# Patient Record
Sex: Female | Born: 1960
Health system: Southern US, Community
[De-identification: ages and names within clinical notes are randomized; demographics above are authoritative.]

## PROBLEM LIST (undated history)

## (undated) DIAGNOSIS — I700 Atherosclerosis of aorta: Secondary | ICD-10-CM

## (undated) DIAGNOSIS — E785 Hyperlipidemia, unspecified: Secondary | ICD-10-CM

## (undated) DIAGNOSIS — G8929 Other chronic pain: Secondary | ICD-10-CM

## (undated) DIAGNOSIS — I708 Atherosclerosis of other arteries: Principal | ICD-10-CM

## (undated) DIAGNOSIS — F419 Anxiety disorder, unspecified: Secondary | ICD-10-CM

## (undated) HISTORY — DX: Atherosclerosis of other arteries: I70.8

## (undated) HISTORY — PX: APPENDECTOMY: SHX54

## (undated) HISTORY — PX: CHOLECYSTECTOMY: SHX55

## (undated) HISTORY — DX: Hyperlipidemia, unspecified: E78.5

## (undated) HISTORY — PX: ABDOMINAL HYSTERECTOMY: SHX81

## (undated) HISTORY — PX: CARPAL TUNNEL RELEASE: SHX101

## (undated) HISTORY — PX: NEPHRECTOMY: SHX65

## (undated) HISTORY — PX: BREAST LUMPECTOMY: SHX2

## (undated) HISTORY — DX: Atherosclerosis of aorta: I70.0

## (undated) HISTORY — DX: Anxiety disorder, unspecified: F41.9

## (undated) HISTORY — DX: Other chronic pain: G89.29

---

## 2007-01-05 ENCOUNTER — Emergency Department: Payer: Self-pay

## 2007-01-05 ENCOUNTER — Other Ambulatory Visit: Payer: Self-pay

## 2007-02-27 ENCOUNTER — Ambulatory Visit: Payer: Self-pay | Admitting: Orthopedic Surgery

## 2007-03-06 ENCOUNTER — Ambulatory Visit: Payer: Self-pay | Admitting: Orthopedic Surgery

## 2008-12-16 ENCOUNTER — Emergency Department: Payer: Self-pay | Admitting: Unknown Physician Specialty

## 2010-03-21 ENCOUNTER — Emergency Department: Payer: Self-pay | Admitting: Emergency Medicine

## 2011-01-29 ENCOUNTER — Encounter: Payer: Self-pay | Admitting: Family Medicine

## 2011-01-30 ENCOUNTER — Encounter: Payer: Self-pay | Admitting: Family Medicine

## 2011-02-28 ENCOUNTER — Encounter: Payer: Self-pay | Admitting: Family Medicine

## 2011-03-07 ENCOUNTER — Ambulatory Visit: Payer: Self-pay | Admitting: Family Medicine

## 2012-01-08 DIAGNOSIS — M25519 Pain in unspecified shoulder: Secondary | ICD-10-CM | POA: Diagnosis not present

## 2012-01-08 DIAGNOSIS — E559 Vitamin D deficiency, unspecified: Secondary | ICD-10-CM | POA: Diagnosis not present

## 2012-01-08 DIAGNOSIS — G894 Chronic pain syndrome: Secondary | ICD-10-CM | POA: Diagnosis not present

## 2012-01-08 DIAGNOSIS — M6281 Muscle weakness (generalized): Secondary | ICD-10-CM | POA: Diagnosis not present

## 2012-01-08 DIAGNOSIS — R197 Diarrhea, unspecified: Secondary | ICD-10-CM | POA: Diagnosis not present

## 2012-01-08 DIAGNOSIS — Z733 Stress, not elsewhere classified: Secondary | ICD-10-CM | POA: Diagnosis not present

## 2012-01-08 DIAGNOSIS — E785 Hyperlipidemia, unspecified: Secondary | ICD-10-CM | POA: Diagnosis not present

## 2012-02-11 DIAGNOSIS — H10509 Unspecified blepharoconjunctivitis, unspecified eye: Secondary | ICD-10-CM | POA: Diagnosis not present

## 2012-04-07 DIAGNOSIS — Z733 Stress, not elsewhere classified: Secondary | ICD-10-CM | POA: Diagnosis not present

## 2012-04-07 DIAGNOSIS — M545 Low back pain, unspecified: Secondary | ICD-10-CM | POA: Diagnosis not present

## 2012-04-07 DIAGNOSIS — Z638 Other specified problems related to primary support group: Secondary | ICD-10-CM | POA: Diagnosis not present

## 2012-04-07 DIAGNOSIS — G894 Chronic pain syndrome: Secondary | ICD-10-CM | POA: Diagnosis not present

## 2012-05-14 DIAGNOSIS — L408 Other psoriasis: Secondary | ICD-10-CM | POA: Diagnosis not present

## 2012-07-29 DIAGNOSIS — M4120 Other idiopathic scoliosis, site unspecified: Secondary | ICD-10-CM | POA: Diagnosis not present

## 2012-07-29 DIAGNOSIS — E559 Vitamin D deficiency, unspecified: Secondary | ICD-10-CM | POA: Diagnosis not present

## 2012-07-29 DIAGNOSIS — M542 Cervicalgia: Secondary | ICD-10-CM | POA: Diagnosis not present

## 2012-07-29 DIAGNOSIS — Z733 Stress, not elsewhere classified: Secondary | ICD-10-CM | POA: Diagnosis not present

## 2012-11-09 ENCOUNTER — Ambulatory Visit: Payer: Self-pay | Admitting: Family Medicine

## 2012-11-09 DIAGNOSIS — M542 Cervicalgia: Secondary | ICD-10-CM | POA: Diagnosis not present

## 2012-11-09 DIAGNOSIS — I6529 Occlusion and stenosis of unspecified carotid artery: Secondary | ICD-10-CM | POA: Diagnosis not present

## 2012-12-31 DIAGNOSIS — R0989 Other specified symptoms and signs involving the circulatory and respiratory systems: Secondary | ICD-10-CM | POA: Diagnosis not present

## 2012-12-31 DIAGNOSIS — M542 Cervicalgia: Secondary | ICD-10-CM | POA: Diagnosis not present

## 2013-01-01 DIAGNOSIS — I6529 Occlusion and stenosis of unspecified carotid artery: Secondary | ICD-10-CM | POA: Diagnosis not present

## 2013-01-01 DIAGNOSIS — F17200 Nicotine dependence, unspecified, uncomplicated: Secondary | ICD-10-CM | POA: Diagnosis not present

## 2013-01-01 DIAGNOSIS — R0989 Other specified symptoms and signs involving the circulatory and respiratory systems: Secondary | ICD-10-CM | POA: Diagnosis not present

## 2013-01-01 DIAGNOSIS — E785 Hyperlipidemia, unspecified: Secondary | ICD-10-CM | POA: Diagnosis not present

## 2013-01-09 ENCOUNTER — Emergency Department: Payer: Self-pay | Admitting: Unknown Physician Specialty

## 2013-01-09 DIAGNOSIS — F17200 Nicotine dependence, unspecified, uncomplicated: Secondary | ICD-10-CM | POA: Diagnosis not present

## 2013-01-09 DIAGNOSIS — Z9089 Acquired absence of other organs: Secondary | ICD-10-CM | POA: Diagnosis not present

## 2013-01-09 DIAGNOSIS — Z9079 Acquired absence of other genital organ(s): Secondary | ICD-10-CM | POA: Diagnosis not present

## 2013-01-09 DIAGNOSIS — R0789 Other chest pain: Secondary | ICD-10-CM | POA: Diagnosis not present

## 2013-01-09 DIAGNOSIS — Z79899 Other long term (current) drug therapy: Secondary | ICD-10-CM | POA: Diagnosis not present

## 2013-01-09 DIAGNOSIS — R079 Chest pain, unspecified: Secondary | ICD-10-CM | POA: Diagnosis not present

## 2013-01-09 DIAGNOSIS — E785 Hyperlipidemia, unspecified: Secondary | ICD-10-CM | POA: Diagnosis not present

## 2013-01-09 LAB — CBC
HCT: 39.6 % (ref 35.0–47.0)
HGB: 13.4 g/dL (ref 12.0–16.0)
MCHC: 33.8 g/dL (ref 32.0–36.0)
MCV: 89 fL (ref 80–100)
Platelet: 229 10*3/uL (ref 150–440)

## 2013-01-09 LAB — BASIC METABOLIC PANEL
Anion Gap: 10 (ref 7–16)
BUN: 6 mg/dL — ABNORMAL LOW (ref 7–18)
Calcium, Total: 8.8 mg/dL (ref 8.5–10.1)
Co2: 24 mmol/L (ref 21–32)
Creatinine: 0.73 mg/dL (ref 0.60–1.30)
EGFR (Non-African Amer.): >60
Glucose: 130 mg/dL — ABNORMAL HIGH (ref 65–99)
Potassium: 3.8 mmol/L (ref 3.5–5.1)
Sodium: 138 mmol/L (ref 136–145)

## 2013-01-09 LAB — CK TOTAL AND CKMB (NOT AT ARMC)
CK, Total: 72 U/L (ref 21–215)
CK-MB: <0.5 ng/mL — ABNORMAL LOW (ref 0.5–3.6)

## 2013-01-09 LAB — TROPONIN I
Troponin-I: <0.02 ng/mL
Troponin-I: <0.02 ng/mL

## 2013-01-09 LAB — MAGNESIUM: Magnesium: 2 mg/dL

## 2013-01-12 ENCOUNTER — Ambulatory Visit: Payer: Self-pay | Admitting: Family Medicine

## 2013-01-12 DIAGNOSIS — J40 Bronchitis, not specified as acute or chronic: Secondary | ICD-10-CM | POA: Diagnosis not present

## 2013-01-12 DIAGNOSIS — R9431 Abnormal electrocardiogram [ECG] [EKG]: Secondary | ICD-10-CM | POA: Diagnosis not present

## 2013-01-12 DIAGNOSIS — R079 Chest pain, unspecified: Secondary | ICD-10-CM | POA: Diagnosis not present

## 2013-01-14 DIAGNOSIS — I471 Supraventricular tachycardia: Secondary | ICD-10-CM | POA: Diagnosis not present

## 2013-01-14 DIAGNOSIS — J449 Chronic obstructive pulmonary disease, unspecified: Secondary | ICD-10-CM | POA: Diagnosis not present

## 2013-01-14 DIAGNOSIS — E782 Mixed hyperlipidemia: Secondary | ICD-10-CM | POA: Diagnosis not present

## 2013-01-14 DIAGNOSIS — R079 Chest pain, unspecified: Secondary | ICD-10-CM | POA: Diagnosis not present

## 2013-01-28 DIAGNOSIS — I471 Supraventricular tachycardia: Secondary | ICD-10-CM | POA: Diagnosis not present

## 2013-03-09 DIAGNOSIS — M542 Cervicalgia: Secondary | ICD-10-CM | POA: Diagnosis not present

## 2013-04-09 DIAGNOSIS — M542 Cervicalgia: Secondary | ICD-10-CM | POA: Diagnosis not present

## 2013-05-06 DIAGNOSIS — M545 Low back pain, unspecified: Secondary | ICD-10-CM | POA: Diagnosis not present

## 2013-05-06 DIAGNOSIS — F411 Generalized anxiety disorder: Secondary | ICD-10-CM | POA: Diagnosis not present

## 2013-05-06 DIAGNOSIS — M542 Cervicalgia: Secondary | ICD-10-CM | POA: Diagnosis not present

## 2013-05-12 ENCOUNTER — Ambulatory Visit: Payer: Self-pay | Admitting: Family Medicine

## 2013-05-12 DIAGNOSIS — M545 Low back pain, unspecified: Secondary | ICD-10-CM | POA: Diagnosis not present

## 2013-05-12 DIAGNOSIS — M549 Dorsalgia, unspecified: Secondary | ICD-10-CM | POA: Diagnosis not present

## 2013-06-02 ENCOUNTER — Ambulatory Visit: Payer: Self-pay | Admitting: Family Medicine

## 2013-06-02 DIAGNOSIS — R197 Diarrhea, unspecified: Secondary | ICD-10-CM | POA: Diagnosis not present

## 2013-06-02 DIAGNOSIS — M79609 Pain in unspecified limb: Secondary | ICD-10-CM | POA: Diagnosis not present

## 2013-06-07 DIAGNOSIS — M542 Cervicalgia: Secondary | ICD-10-CM | POA: Diagnosis not present

## 2013-06-07 DIAGNOSIS — M5430 Sciatica, unspecified side: Secondary | ICD-10-CM | POA: Diagnosis not present

## 2013-06-16 DIAGNOSIS — N390 Urinary tract infection, site not specified: Secondary | ICD-10-CM | POA: Diagnosis not present

## 2013-06-16 DIAGNOSIS — M545 Low back pain, unspecified: Secondary | ICD-10-CM | POA: Diagnosis not present

## 2013-08-02 DIAGNOSIS — F411 Generalized anxiety disorder: Secondary | ICD-10-CM | POA: Diagnosis not present

## 2013-08-02 DIAGNOSIS — N390 Urinary tract infection, site not specified: Secondary | ICD-10-CM | POA: Diagnosis not present

## 2013-09-08 ENCOUNTER — Ambulatory Visit: Payer: Self-pay | Admitting: Family Medicine

## 2013-09-08 DIAGNOSIS — M25569 Pain in unspecified knee: Secondary | ICD-10-CM | POA: Diagnosis not present

## 2013-10-07 DIAGNOSIS — IMO0002 Reserved for concepts with insufficient information to code with codable children: Secondary | ICD-10-CM | POA: Diagnosis not present

## 2013-10-07 DIAGNOSIS — G894 Chronic pain syndrome: Secondary | ICD-10-CM | POA: Diagnosis not present

## 2013-10-07 DIAGNOSIS — N390 Urinary tract infection, site not specified: Secondary | ICD-10-CM | POA: Diagnosis not present

## 2013-10-11 DIAGNOSIS — N390 Urinary tract infection, site not specified: Secondary | ICD-10-CM | POA: Diagnosis not present

## 2013-11-05 DIAGNOSIS — M25569 Pain in unspecified knee: Secondary | ICD-10-CM | POA: Diagnosis not present

## 2013-11-05 DIAGNOSIS — M25519 Pain in unspecified shoulder: Secondary | ICD-10-CM | POA: Diagnosis not present

## 2013-11-05 DIAGNOSIS — M545 Low back pain, unspecified: Secondary | ICD-10-CM | POA: Diagnosis not present

## 2013-11-05 DIAGNOSIS — G8929 Other chronic pain: Secondary | ICD-10-CM | POA: Diagnosis not present

## 2013-12-03 DIAGNOSIS — Z79899 Other long term (current) drug therapy: Secondary | ICD-10-CM | POA: Diagnosis not present

## 2013-12-03 DIAGNOSIS — G894 Chronic pain syndrome: Secondary | ICD-10-CM | POA: Diagnosis not present

## 2013-12-03 DIAGNOSIS — M545 Low back pain, unspecified: Secondary | ICD-10-CM | POA: Diagnosis not present

## 2014-02-03 DIAGNOSIS — E538 Deficiency of other specified B group vitamins: Secondary | ICD-10-CM | POA: Diagnosis not present

## 2014-03-03 DIAGNOSIS — E538 Deficiency of other specified B group vitamins: Secondary | ICD-10-CM | POA: Diagnosis not present

## 2014-03-10 ENCOUNTER — Ambulatory Visit: Payer: Self-pay | Admitting: Family Medicine

## 2014-03-10 DIAGNOSIS — R922 Inconclusive mammogram: Secondary | ICD-10-CM | POA: Diagnosis not present

## 2014-03-10 DIAGNOSIS — N644 Mastodynia: Secondary | ICD-10-CM | POA: Diagnosis not present

## 2014-04-01 DIAGNOSIS — R071 Chest pain on breathing: Secondary | ICD-10-CM | POA: Diagnosis not present

## 2014-05-03 DIAGNOSIS — G4762 Sleep related leg cramps: Secondary | ICD-10-CM | POA: Diagnosis not present

## 2014-05-04 DIAGNOSIS — D239 Other benign neoplasm of skin, unspecified: Secondary | ICD-10-CM | POA: Diagnosis not present

## 2014-05-04 DIAGNOSIS — L408 Other psoriasis: Secondary | ICD-10-CM | POA: Diagnosis not present

## 2014-06-03 ENCOUNTER — Ambulatory Visit: Payer: Self-pay | Admitting: Family Medicine

## 2014-06-03 DIAGNOSIS — M25519 Pain in unspecified shoulder: Secondary | ICD-10-CM | POA: Diagnosis not present

## 2014-06-03 DIAGNOSIS — M25529 Pain in unspecified elbow: Secondary | ICD-10-CM | POA: Diagnosis not present

## 2014-07-04 DIAGNOSIS — F411 Generalized anxiety disorder: Secondary | ICD-10-CM | POA: Diagnosis not present

## 2014-07-04 DIAGNOSIS — M545 Low back pain, unspecified: Secondary | ICD-10-CM | POA: Diagnosis not present

## 2014-07-04 DIAGNOSIS — Z79899 Other long term (current) drug therapy: Secondary | ICD-10-CM | POA: Diagnosis not present

## 2014-07-04 DIAGNOSIS — G894 Chronic pain syndrome: Secondary | ICD-10-CM | POA: Diagnosis not present

## 2014-08-05 ENCOUNTER — Other Ambulatory Visit: Payer: Self-pay | Admitting: Family Medicine

## 2014-08-05 DIAGNOSIS — M5412 Radiculopathy, cervical region: Secondary | ICD-10-CM

## 2014-08-11 ENCOUNTER — Ambulatory Visit
Admission: RE | Admit: 2014-08-11 | Discharge: 2014-08-11 | Disposition: A | Discharge status: Home / Self Care | Payer: Medicare Other | Source: Ambulatory Visit | Attending: Family Medicine | Admitting: Family Medicine

## 2014-08-11 DIAGNOSIS — M5412 Radiculopathy, cervical region: Secondary | ICD-10-CM

## 2014-10-07 DIAGNOSIS — M791 Myalgia: Secondary | ICD-10-CM | POA: Diagnosis not present

## 2014-10-07 DIAGNOSIS — G894 Chronic pain syndrome: Secondary | ICD-10-CM | POA: Diagnosis not present

## 2014-10-07 DIAGNOSIS — Z658 Other specified problems related to psychosocial circumstances: Secondary | ICD-10-CM | POA: Diagnosis not present

## 2014-10-07 DIAGNOSIS — F419 Anxiety disorder, unspecified: Secondary | ICD-10-CM | POA: Diagnosis not present

## 2014-10-08 IMAGING — CR DG FOOT COMPLETE 3+V*L*
1 series · 3 of 3 positions shown · non-contrast
Comparison: none

REASON FOR EXAM: pain in toe
COMMENTS:

PROCEDURE:     KDR - KDXR FOOT LT COMP W/OBLIQUES  - June 02, 2013 [DATE]
RESULT:     Three views of the left foot reveal the bones to be adequately
mineralized. There is no evidence of an acute fracture. The overlying soft
tissues are normal in appearance.

[Series 1: ap · 0.17mm/px · 3 of 3 slices shown]
[im 1/3]
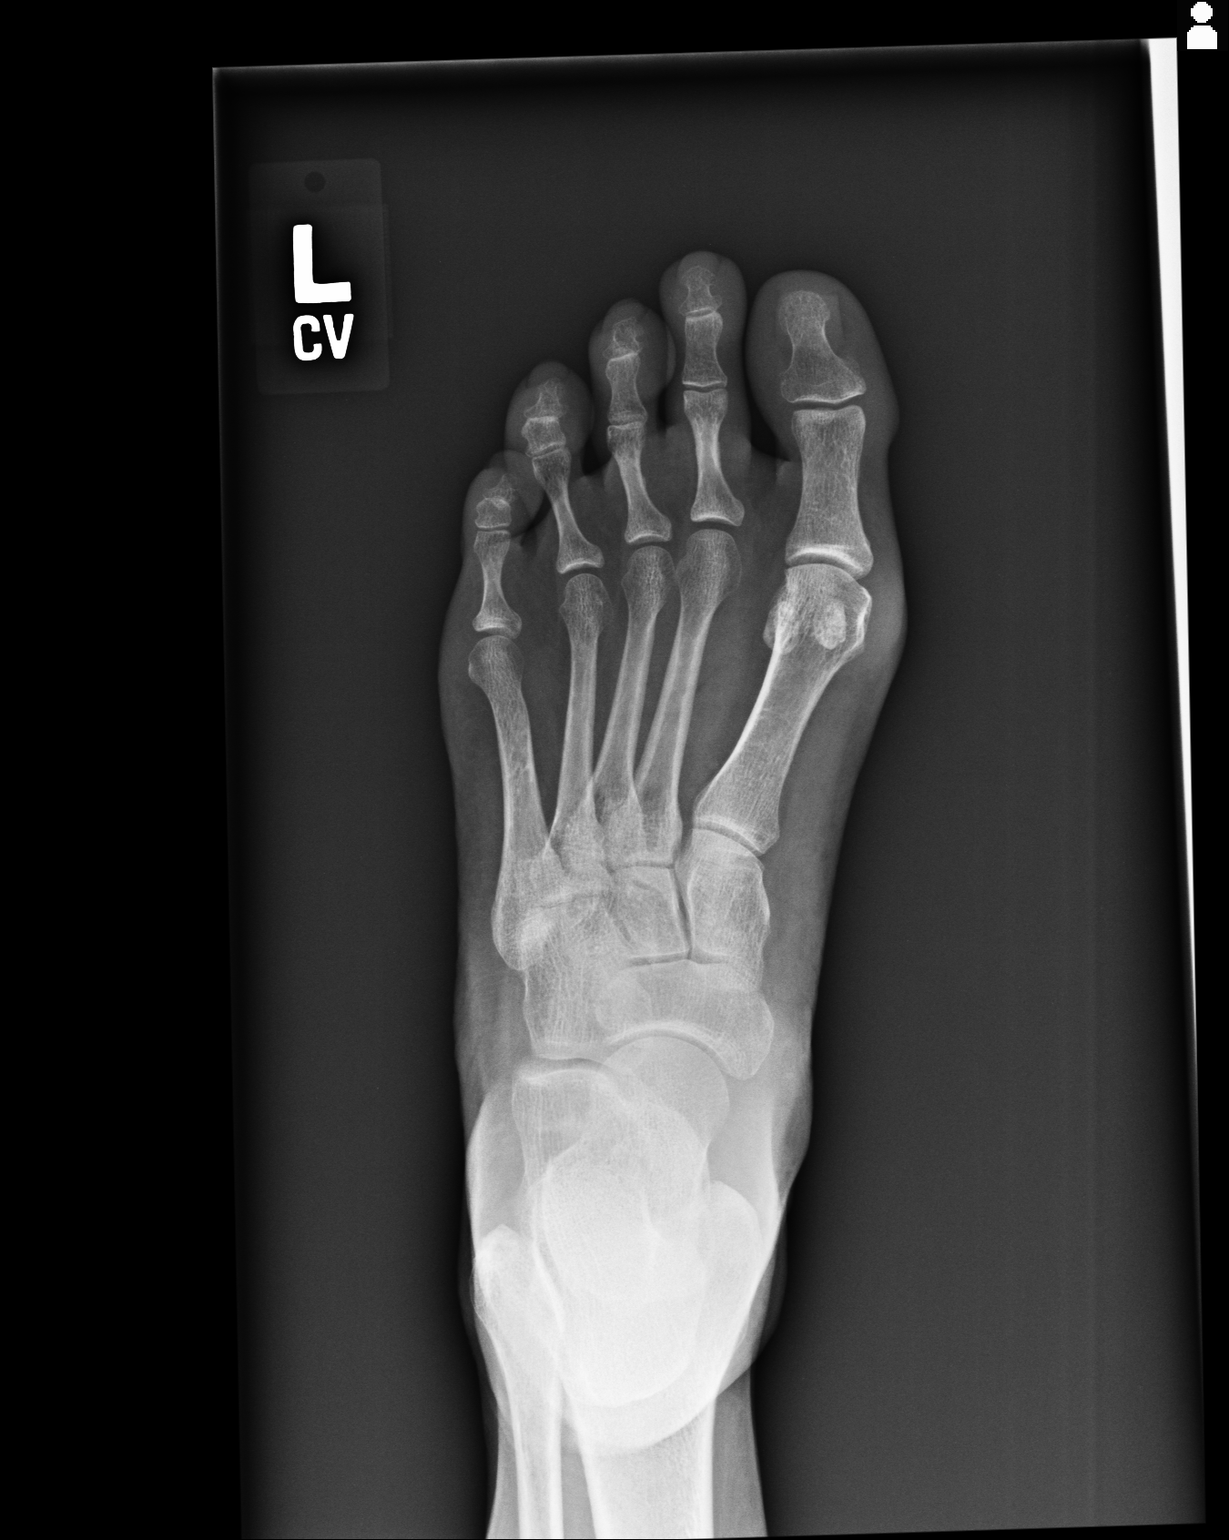
[im 2/3]
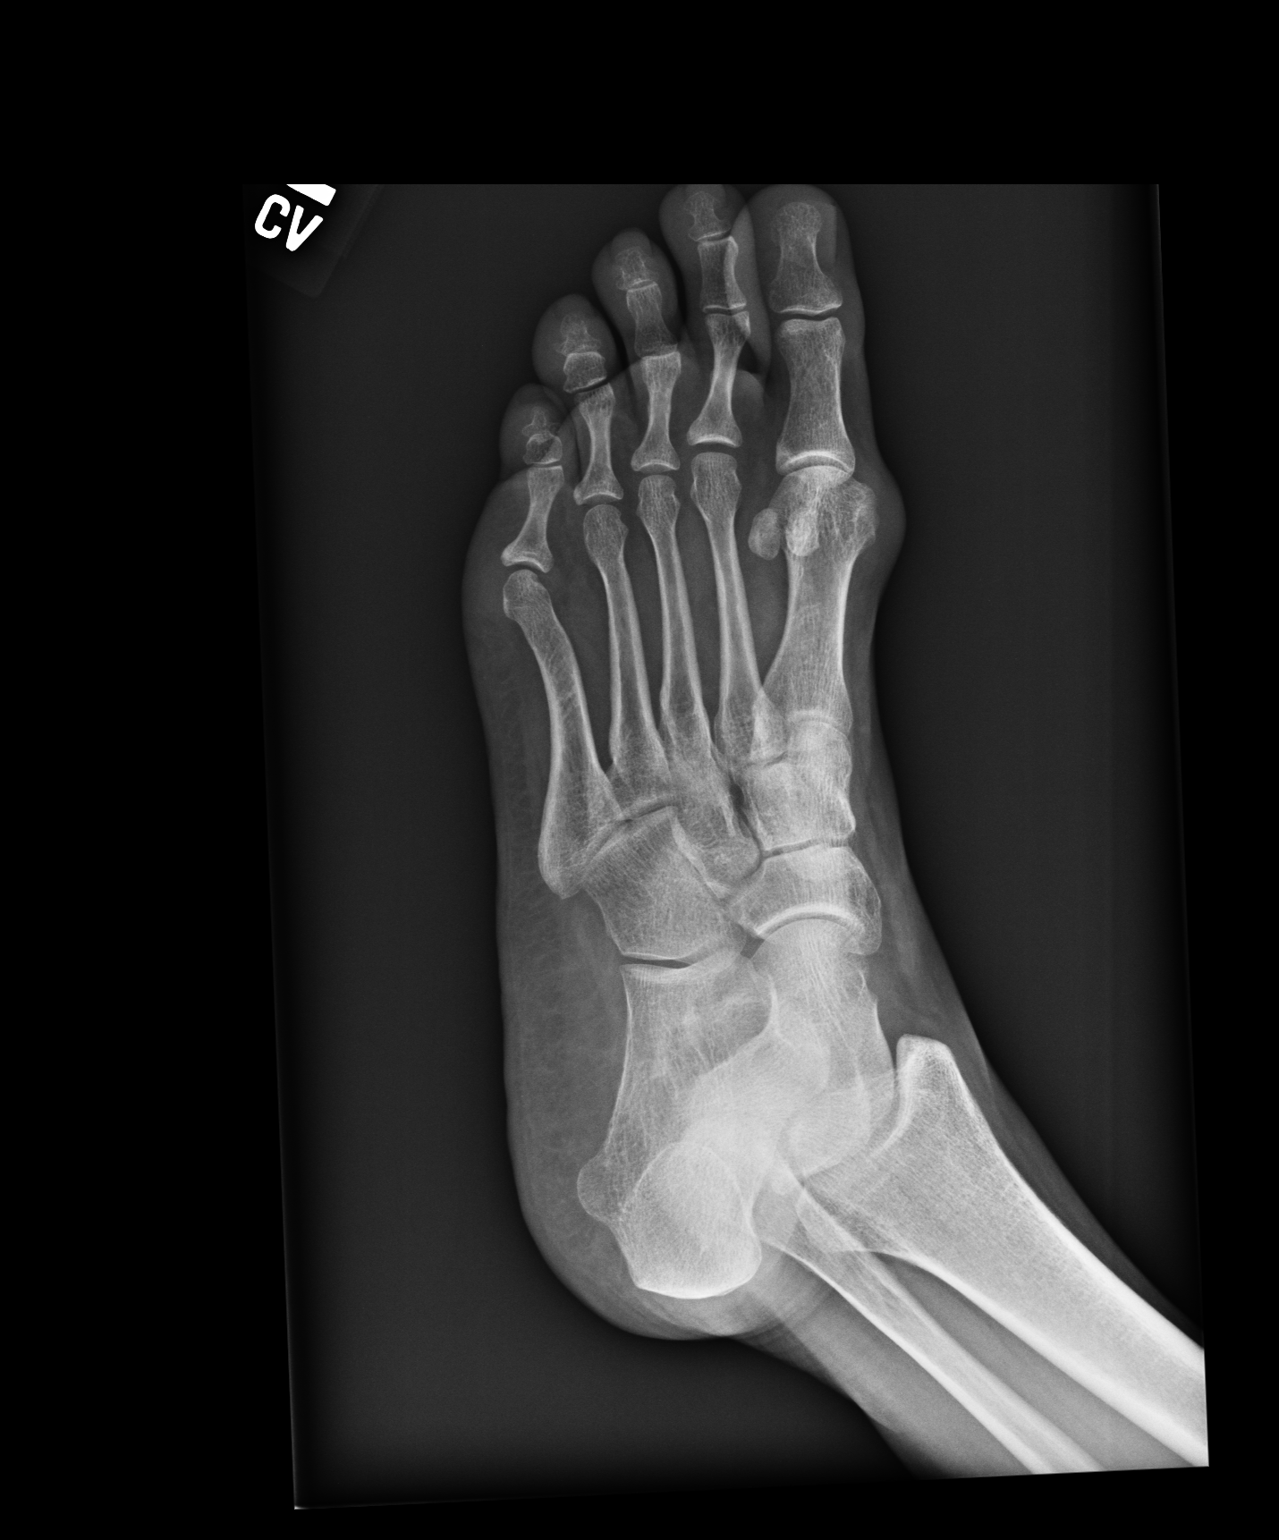
[im 3/3]
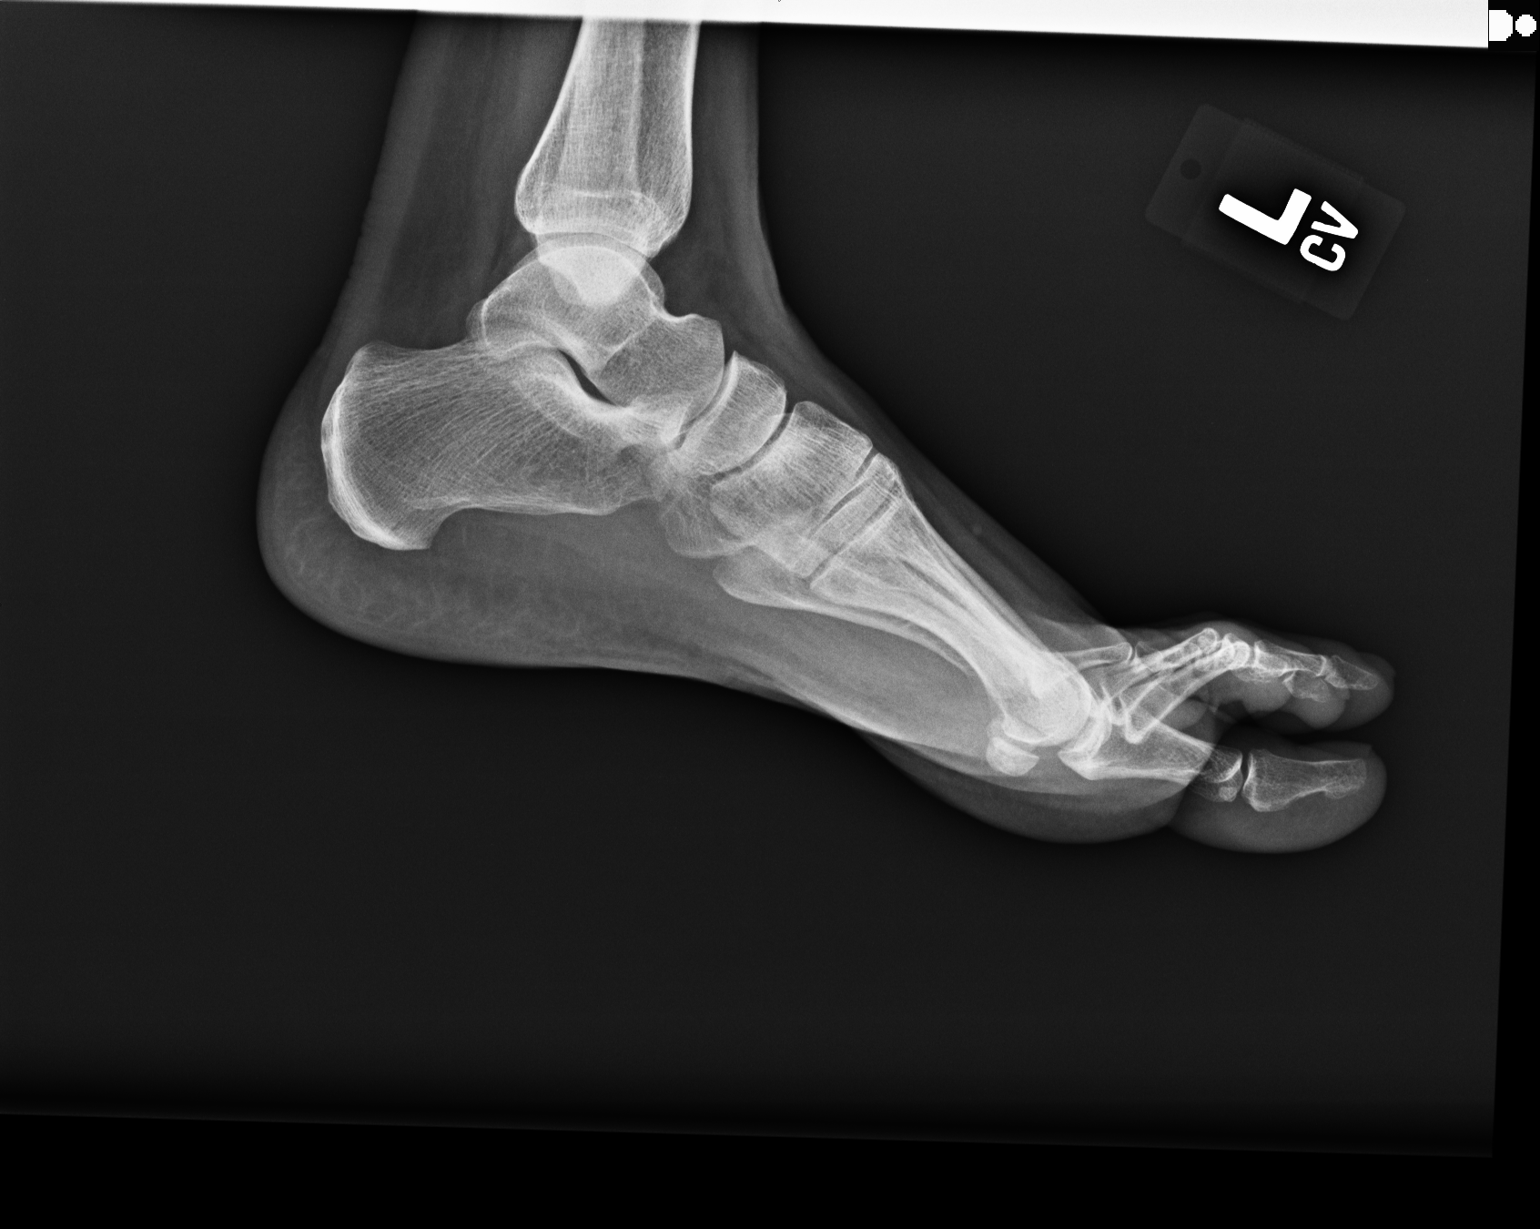

[3 of 3 positions shown; findings below may reference images not displayed]

IMPRESSION: There is no acute bony abnormality of the left foot.

[REDACTED]

## 2014-10-13 DIAGNOSIS — E785 Hyperlipidemia, unspecified: Secondary | ICD-10-CM | POA: Diagnosis not present

## 2014-11-08 DIAGNOSIS — F419 Anxiety disorder, unspecified: Secondary | ICD-10-CM | POA: Diagnosis not present

## 2014-11-08 DIAGNOSIS — Z79891 Long term (current) use of opiate analgesic: Secondary | ICD-10-CM | POA: Diagnosis not present

## 2014-11-12 DIAGNOSIS — N390 Urinary tract infection, site not specified: Secondary | ICD-10-CM | POA: Diagnosis not present

## 2014-12-06 DIAGNOSIS — F419 Anxiety disorder, unspecified: Secondary | ICD-10-CM | POA: Diagnosis not present

## 2014-12-06 DIAGNOSIS — M542 Cervicalgia: Secondary | ICD-10-CM | POA: Diagnosis not present

## 2015-01-06 DIAGNOSIS — M5410 Radiculopathy, site unspecified: Secondary | ICD-10-CM | POA: Diagnosis not present

## 2015-01-06 DIAGNOSIS — R634 Abnormal weight loss: Secondary | ICD-10-CM | POA: Diagnosis not present

## 2015-01-06 DIAGNOSIS — F419 Anxiety disorder, unspecified: Secondary | ICD-10-CM | POA: Diagnosis not present

## 2015-01-06 DIAGNOSIS — M542 Cervicalgia: Secondary | ICD-10-CM | POA: Diagnosis not present

## 2015-01-11 ENCOUNTER — Ambulatory Visit: Payer: Self-pay | Admitting: Family Medicine

## 2015-01-11 DIAGNOSIS — M545 Low back pain: Secondary | ICD-10-CM | POA: Diagnosis not present

## 2015-01-11 DIAGNOSIS — M25551 Pain in right hip: Secondary | ICD-10-CM | POA: Diagnosis not present

## 2015-02-06 DIAGNOSIS — F419 Anxiety disorder, unspecified: Secondary | ICD-10-CM | POA: Diagnosis not present

## 2015-02-06 DIAGNOSIS — M542 Cervicalgia: Secondary | ICD-10-CM | POA: Diagnosis not present

## 2015-02-06 DIAGNOSIS — M79605 Pain in left leg: Secondary | ICD-10-CM | POA: Diagnosis not present

## 2015-02-06 DIAGNOSIS — M79604 Pain in right leg: Secondary | ICD-10-CM | POA: Diagnosis not present

## 2015-02-28 DIAGNOSIS — J209 Acute bronchitis, unspecified: Secondary | ICD-10-CM | POA: Diagnosis not present

## 2015-03-03 DIAGNOSIS — R0602 Shortness of breath: Secondary | ICD-10-CM | POA: Diagnosis not present

## 2015-03-03 DIAGNOSIS — R0789 Other chest pain: Secondary | ICD-10-CM | POA: Insufficient documentation

## 2015-03-03 DIAGNOSIS — R002 Palpitations: Secondary | ICD-10-CM | POA: Diagnosis not present

## 2015-03-07 DIAGNOSIS — F419 Anxiety disorder, unspecified: Secondary | ICD-10-CM | POA: Diagnosis not present

## 2015-03-07 DIAGNOSIS — M542 Cervicalgia: Secondary | ICD-10-CM | POA: Diagnosis not present

## 2015-03-07 DIAGNOSIS — R252 Cramp and spasm: Secondary | ICD-10-CM | POA: Diagnosis not present

## 2015-03-10 DIAGNOSIS — R002 Palpitations: Secondary | ICD-10-CM | POA: Diagnosis not present

## 2015-03-14 DIAGNOSIS — R0602 Shortness of breath: Secondary | ICD-10-CM | POA: Diagnosis not present

## 2015-03-31 DIAGNOSIS — R0789 Other chest pain: Secondary | ICD-10-CM | POA: Diagnosis not present

## 2015-04-05 DIAGNOSIS — R0789 Other chest pain: Secondary | ICD-10-CM | POA: Diagnosis not present

## 2015-04-07 DIAGNOSIS — F419 Anxiety disorder, unspecified: Secondary | ICD-10-CM | POA: Diagnosis not present

## 2015-04-07 DIAGNOSIS — M542 Cervicalgia: Secondary | ICD-10-CM | POA: Diagnosis not present

## 2015-04-07 DIAGNOSIS — E785 Hyperlipidemia, unspecified: Secondary | ICD-10-CM | POA: Diagnosis not present

## 2015-04-08 ENCOUNTER — Emergency Department
Admit: 2015-04-08 | Disposition: A | Discharge status: Home / Self Care | Payer: Self-pay | Admitting: Emergency Medicine

## 2015-04-08 DIAGNOSIS — L03115 Cellulitis of right lower limb: Secondary | ICD-10-CM | POA: Diagnosis not present

## 2015-04-08 DIAGNOSIS — M7989 Other specified soft tissue disorders: Secondary | ICD-10-CM | POA: Diagnosis not present

## 2015-04-08 DIAGNOSIS — Z91040 Latex allergy status: Secondary | ICD-10-CM | POA: Diagnosis not present

## 2015-04-08 DIAGNOSIS — M25562 Pain in left knee: Secondary | ICD-10-CM | POA: Diagnosis not present

## 2015-04-08 DIAGNOSIS — L03116 Cellulitis of left lower limb: Secondary | ICD-10-CM | POA: Diagnosis not present

## 2015-04-08 DIAGNOSIS — Z720 Tobacco use: Secondary | ICD-10-CM | POA: Diagnosis not present

## 2015-04-09 DIAGNOSIS — M25562 Pain in left knee: Secondary | ICD-10-CM | POA: Diagnosis not present

## 2015-04-09 DIAGNOSIS — M7989 Other specified soft tissue disorders: Secondary | ICD-10-CM | POA: Diagnosis not present

## 2015-04-11 ENCOUNTER — Ambulatory Visit
Admit: 2015-04-11 | Disposition: A | Discharge status: Home / Self Care | Payer: Self-pay | Attending: Family Medicine | Admitting: Family Medicine

## 2015-04-11 DIAGNOSIS — M25462 Effusion, left knee: Secondary | ICD-10-CM | POA: Diagnosis not present

## 2015-04-11 DIAGNOSIS — M21731 Unequal limb length (acquired), right ulna: Secondary | ICD-10-CM | POA: Diagnosis not present

## 2015-04-11 DIAGNOSIS — M7989 Other specified soft tissue disorders: Secondary | ICD-10-CM | POA: Diagnosis not present

## 2015-04-11 DIAGNOSIS — M25562 Pain in left knee: Secondary | ICD-10-CM | POA: Diagnosis not present

## 2015-04-28 DIAGNOSIS — L400 Psoriasis vulgaris: Secondary | ICD-10-CM | POA: Diagnosis not present

## 2015-04-28 DIAGNOSIS — D485 Neoplasm of uncertain behavior of skin: Secondary | ICD-10-CM | POA: Diagnosis not present

## 2015-04-28 DIAGNOSIS — L821 Other seborrheic keratosis: Secondary | ICD-10-CM | POA: Diagnosis not present

## 2015-04-28 DIAGNOSIS — L858 Other specified epidermal thickening: Secondary | ICD-10-CM | POA: Diagnosis not present

## 2015-05-05 DIAGNOSIS — M549 Dorsalgia, unspecified: Secondary | ICD-10-CM | POA: Diagnosis not present

## 2015-05-05 DIAGNOSIS — F419 Anxiety disorder, unspecified: Secondary | ICD-10-CM | POA: Diagnosis not present

## 2015-05-05 DIAGNOSIS — M542 Cervicalgia: Secondary | ICD-10-CM | POA: Diagnosis not present

## 2015-06-02 DIAGNOSIS — M25519 Pain in unspecified shoulder: Secondary | ICD-10-CM | POA: Insufficient documentation

## 2015-06-02 DIAGNOSIS — E785 Hyperlipidemia, unspecified: Secondary | ICD-10-CM | POA: Insufficient documentation

## 2015-06-02 DIAGNOSIS — M4120 Other idiopathic scoliosis, site unspecified: Secondary | ICD-10-CM | POA: Insufficient documentation

## 2015-06-02 DIAGNOSIS — F419 Anxiety disorder, unspecified: Secondary | ICD-10-CM | POA: Insufficient documentation

## 2015-06-02 DIAGNOSIS — M79606 Pain in leg, unspecified: Secondary | ICD-10-CM | POA: Insufficient documentation

## 2015-06-02 DIAGNOSIS — E538 Deficiency of other specified B group vitamins: Secondary | ICD-10-CM | POA: Insufficient documentation

## 2015-06-02 DIAGNOSIS — G8929 Other chronic pain: Secondary | ICD-10-CM | POA: Insufficient documentation

## 2015-06-02 DIAGNOSIS — Z7189 Other specified counseling: Secondary | ICD-10-CM | POA: Insufficient documentation

## 2015-06-02 DIAGNOSIS — M545 Low back pain, unspecified: Secondary | ICD-10-CM | POA: Insufficient documentation

## 2015-06-02 DIAGNOSIS — M5410 Radiculopathy, site unspecified: Secondary | ICD-10-CM | POA: Insufficient documentation

## 2015-06-02 DIAGNOSIS — L409 Psoriasis, unspecified: Secondary | ICD-10-CM | POA: Insufficient documentation

## 2015-06-02 DIAGNOSIS — F1120 Opioid dependence, uncomplicated: Secondary | ICD-10-CM | POA: Insufficient documentation

## 2015-06-02 DIAGNOSIS — G894 Chronic pain syndrome: Secondary | ICD-10-CM | POA: Insufficient documentation

## 2015-06-02 DIAGNOSIS — E559 Vitamin D deficiency, unspecified: Secondary | ICD-10-CM | POA: Insufficient documentation

## 2015-06-06 ENCOUNTER — Encounter (INDEPENDENT_AMBULATORY_CARE_PROVIDER_SITE_OTHER): Payer: Self-pay

## 2015-06-06 ENCOUNTER — Encounter: Payer: Self-pay | Admitting: Family Medicine

## 2015-06-06 ENCOUNTER — Ambulatory Visit (INDEPENDENT_AMBULATORY_CARE_PROVIDER_SITE_OTHER): Payer: Medicare Other | Admitting: Family Medicine

## 2015-06-06 VITALS — BP 100/58 | HR 98 | Resp 15 | Ht 61.0 in | Wt 105.8 lb

## 2015-06-06 DIAGNOSIS — F419 Anxiety disorder, unspecified: Secondary | ICD-10-CM

## 2015-06-06 DIAGNOSIS — M542 Cervicalgia: Secondary | ICD-10-CM

## 2015-06-06 DIAGNOSIS — M549 Dorsalgia, unspecified: Secondary | ICD-10-CM

## 2015-06-06 DIAGNOSIS — G8929 Other chronic pain: Principal | ICD-10-CM

## 2015-06-06 MED ORDER — OXYCODONE HCL 10 MG PO TABS: 10.0000 mg | ORAL_TABLET | Freq: Three times a day (TID) | ORAL | Status: DC | PRN

## 2015-06-06 MED ORDER — CLONAZEPAM 1 MG PO TABS: 1.0000 mg | ORAL_TABLET | Freq: Two times a day (BID) | ORAL | Status: DC | PRN

## 2015-06-06 MED ORDER — TIZANIDINE HCL 4 MG PO TABS: 4.0000 mg | ORAL_TABLET | Freq: Three times a day (TID) | ORAL | Status: DC | PRN

## 2015-06-06 NOTE — Progress Notes (Signed)
Name: Annette Arnold   MRN: [id number]    DOB: [birth year]   Date:06/06/2015       Progress Note  Subjective  Chief Complaint  Chief Complaint  Patient presents with  . Pain    1 month follow up   . Anxiety    Anxiety Symptoms include depressed mood, insomnia, irritability, nervous/anxious behavior and palpitations. Patient reports no feeling of choking, malaise, panic, restlessness or shortness of breath.   Her past medical history is significant for anxiety/panic attacks. There is no history of depression. Past treatments include benzodiazephines. The treatment provided moderate relief. Compliance with prior treatments has been good.  Neck Pain  This is a chronic problem. The problem occurs daily. The pain is present in the right side and left side. The quality of the pain is described as burning, cramping and stabbing. The pain is at a severity of 3/10. The pain is moderate. Stiffness is present in the morning. Pertinent negatives include no fever, headaches or syncope. She has tried oral narcotics and muscle relaxants for the symptoms. The treatment provided moderate (Cyclobenzaprine 10mg  is not working for neck pain and muscle spasm.) relief.      Past Medical History  Diagnosis Date  . Anxiety   . Chronic pain   . Hyperlipidemia     History  Substance Use Topics  . Smoking status: Current Every Day Smoker -- 1.00 packs/day for 30 years    Types: Cigarettes  . Smokeless tobacco: Never Used  . Alcohol Use: No     Current outpatient prescriptions:  .  clobetasol cream (TEMOVATE) 0.05 %, , Disp: , Rfl: 0 .  clonazePAM (KLONOPIN) 1 MG tablet, Take 1 tablet (1 mg total) by mouth 2 (two) times daily as needed for anxiety., Disp: 60 tablet, Rfl: 0 .  Oxycodone HCl 10 MG TABS, Take 1 tablet (10 mg total) by mouth 3 (three) times daily as needed., Disp: 90 tablet, Rfl: 0 .  pravastatin (PRAVACHOL) 40 MG tablet, Take 1 tablet by mouth daily., Disp: , Rfl:  .  tiZANidine (ZANAFLEX)  4 MG tablet, Take 1 tablet (4 mg total) by mouth every 8 (eight) hours as needed for muscle spasms., Disp: 90 tablet, Rfl: 0  Allergies  Allergen Reactions  . Codeine Nausea And Vomiting    Review of Systems  Constitutional: Positive for irritability. Negative for fever.  Respiratory: Negative for shortness of breath.   Cardiovascular: Positive for palpitations. Negative for syncope.  Musculoskeletal: Positive for back pain and neck pain. Negative for joint pain.  Neurological: Negative for headaches.  Psychiatric/Behavioral: The patient is nervous/anxious and has insomnia.       Objective  Filed Vitals:   06/06/15 1043  BP: 100/58  Pulse: 98  Resp: 15  Height: 5\' 1"  (1.549 m)  Weight: 105 lb 12.8 oz (47.991 kg)  SpO2: 94%     Physical Exam  Constitutional: She is well-developed, well-nourished, and in no distress.  Cardiovascular: Normal rate and regular rhythm.   Pulmonary/Chest: Effort normal.  Musculoskeletal:       Cervical back: She exhibits tenderness, pain and spasm.       Back:  Nursing note and vitals reviewed.     No results found for this or any previous visit (from the past 2160 hour(s)).   Assessment & Plan  1. Chronic neck and back pain  Patient has chronic neck and lower back pain. She is on oxycodone 10 mg 3 times a day when necessary. Patient is  compliant with the controlled substances agreement. We have DC'd cyclobenzaprine and started patient on tizanidine for muscle spasm. Refills provided and follow-up in 1 month.  - Oxycodone HCl 10 MG TABS; Take 1 tablet (10 mg total) by mouth 3 (three) times daily as needed.  Dispense: 90 tablet; Refill: 0 - tiZANidine (ZANAFLEX) 4 MG tablet; Take 1 tablet (4 mg total) by mouth every 8 (eight) hours as needed for muscle spasms.  Dispense: 90 tablet; Refill: 0  2. Anxiety Symptoms of anxiety are stable and controlled on present benzodiazepine therapy. Refills provided. Follow-up in one month. -  clonazePAM (KLONOPIN) 1 MG tablet; Take 1 tablet (1 mg total) by mouth 2 (two) times daily as needed for anxiety.  Dispense: 60 tablet; Refill: 0    Nakai Pollio [facility] 06/06/2015 11:23 AM

## 2015-07-05 ENCOUNTER — Ambulatory Visit: Payer: Medicare Other | Admitting: Family Medicine

## 2015-07-07 ENCOUNTER — Ambulatory Visit (INDEPENDENT_AMBULATORY_CARE_PROVIDER_SITE_OTHER): Payer: Medicare Other | Admitting: Family Medicine

## 2015-07-07 ENCOUNTER — Encounter: Payer: Self-pay | Admitting: Family Medicine

## 2015-07-07 VITALS — BP 100/66 | HR 95 | Temp 98.3°F | Resp 19 | Ht 61.0 in | Wt 104.4 lb

## 2015-07-07 DIAGNOSIS — M542 Cervicalgia: Secondary | ICD-10-CM

## 2015-07-07 DIAGNOSIS — G8929 Other chronic pain: Secondary | ICD-10-CM

## 2015-07-07 DIAGNOSIS — M549 Dorsalgia, unspecified: Secondary | ICD-10-CM | POA: Diagnosis not present

## 2015-07-07 DIAGNOSIS — F419 Anxiety disorder, unspecified: Secondary | ICD-10-CM

## 2015-07-07 DIAGNOSIS — M25551 Pain in right hip: Secondary | ICD-10-CM

## 2015-07-07 MED ORDER — NAPROXEN 500 MG PO TABS: 500.0000 mg | ORAL_TABLET | Freq: Two times a day (BID) | ORAL | Status: DC

## 2015-07-07 MED ORDER — OXYCODONE HCL 10 MG PO TABS: 10.0000 mg | ORAL_TABLET | Freq: Three times a day (TID) | ORAL | Status: DC | PRN

## 2015-07-07 MED ORDER — CLONAZEPAM 1 MG PO TABS: 1.0000 mg | ORAL_TABLET | Freq: Two times a day (BID) | ORAL | Status: DC | PRN

## 2015-07-07 NOTE — Progress Notes (Signed)
Name: Annette Arnold   MRN: [id number]    DOB: 11-26-1961   Date:07/07/2015       Progress Note  Subjective  Chief Complaint  Chief Complaint  Patient presents with  . Follow-up    4 wk  . Back Pain  . Hyperlipidemia    Neck Pain  This is a chronic problem. The quality of the pain is described as aching. The pain is at a severity of 6/10. The pain is moderate. Nothing aggravates the symptoms. Stiffness is present all day. Annette Arnold has tried oral narcotics for the symptoms. The treatment provided moderate relief.  Anxiety Presents for follow-up visit. Symptoms include insomnia, irritability, muscle tension and nervous/anxious behavior. Patient reports no panic. The severity of symptoms is moderate.   Past treatments include benzodiazephines. The treatment provided moderate relief. Compliance with prior treatments has been good.  Hip Pain  The pain is present in the right hip and right thigh (right gluteal area.). The quality of the pain is described as cramping. The pain is at a severity of 7/10. The pain is moderate. Annette Arnold has tried heat for the symptoms. The treatment provided moderate relief.      Past Medical History  Diagnosis Date  . Anxiety   . Chronic pain   . Hyperlipidemia     Past Surgical History  Procedure Laterality Date  . Breast lumpectomy    . Abdominal hysterectomy    . Carpal tunnel release    . Cholecystectomy    . Appendectomy      Family History  Problem Relation Age of Onset  . Cancer Mother   . Diabetes Mother   . Thyroid disease Mother   . Heart disease Father     History   Social History  . Marital Status: Married    Spouse Name: N/A  . Number of Children: N/A  . Years of Education: N/A   Occupational History  . Not on file.   Social History Main Topics  . Smoking status: Current Every Day Smoker -- 1.00 packs/day for 30 years    Types: Cigarettes  . Smokeless tobacco: Never Used  . Alcohol Use: No  . Drug Use: No  . Sexual Activity:  Yes   Other Topics Concern  . Not on file   Social History Narrative     Current outpatient prescriptions:  .  clonazePAM (KLONOPIN) 1 MG tablet, Take 1 tablet (1 mg total) by mouth 2 (two) times daily as needed for anxiety., Disp: 60 tablet, Rfl: 0 .  Oxycodone HCl 10 MG TABS, Take 1 tablet (10 mg total) by mouth 3 (three) times daily as needed., Disp: 90 tablet, Rfl: 0 .  pravastatin (PRAVACHOL) 40 MG tablet, Take 1 tablet by mouth daily., Disp: , Rfl:  .  tiZANidine (ZANAFLEX) 4 MG tablet, Take 1 tablet (4 mg total) by mouth every 8 (eight) hours as needed for muscle spasms., Disp: 90 tablet, Rfl: 0 .  clobetasol cream (TEMOVATE) 0.05 %, , Disp: , Rfl: 0  Allergies  Allergen Reactions  . Codeine Nausea And Vomiting     Review of Systems  Constitutional: Positive for irritability.  Musculoskeletal: Positive for joint pain and neck pain.  Psychiatric/Behavioral: The patient is nervous/anxious and has insomnia.       Objective  Filed Vitals:   07/07/15 1158  BP: 100/66  Pulse: 95  Temp: 98.3 F (36.8 C)  TempSrc: Oral  Resp: 19  Height: 5\' 1"  (1.549 m)  Weight: 104 lb  6.4 oz (47.356 kg)  SpO2: 96%    Physical Exam  Constitutional: Annette Arnold is well-developed, well-nourished, and in no distress.  HENT:  Head: Normocephalic and atraumatic.  Cardiovascular: Normal rate, regular rhythm, S1 normal and S2 normal.   Pulmonary/Chest: Effort normal and breath sounds normal.  Musculoskeletal:       Cervical back: Annette Arnold exhibits tenderness, pain and spasm.       Back:       Legs: Nursing note and vitals reviewed.    Assessment & Plan 1. Anxiety [name] reports improvement in her symptoms of anxiety with the increased dosage of Klonopin. Annette Arnold is aware of the tolerance potential and the interaction with opioids of benzodiazepines. Patient is compliant with the controlled substances agreement. Refills provided - clonazePAM (KLONOPIN) 1 MG tablet; Take 1 tablet (1 mg total) by  mouth 2 (two) times daily as needed for anxiety.  Dispense: 60 tablet; Refill: 0  2. Acute pain of right hip We will obtain x-rays to evaluate for acute onset of right hip pain. Started patient on 10 day course of high-dose NSAID therapy. Reevaluate after review of x-rays - naproxen (NAPROSYN) 500 MG tablet; Take 1 tablet (500 mg total) by mouth 2 (two) times daily with a meal.  Dispense: 20 tablet; Refill: 0 - DG Arthro Hip Right; Future  3. Chronic neck and back pain Patient has chronic cervical spine and low back pain, responsive to chronic opioid therapy. Patient is compliant with the controlled substances agreement and is aware of the tolerance potential for opioids. Refills provided - Oxycodone HCl 10 MG TABS; Take 1 tablet (10 mg total) by mouth 3 (three) times daily as needed.  Dispense: 90 tablet; Refill: 0   Annette Arnold [facility] 07/07/2015 12:11 PM

## 2015-08-01 ENCOUNTER — Encounter: Payer: Self-pay | Admitting: Family Medicine

## 2015-08-01 ENCOUNTER — Ambulatory Visit (INDEPENDENT_AMBULATORY_CARE_PROVIDER_SITE_OTHER): Payer: Medicare Other | Admitting: Family Medicine

## 2015-08-01 DIAGNOSIS — M549 Dorsalgia, unspecified: Secondary | ICD-10-CM

## 2015-08-01 DIAGNOSIS — G8929 Other chronic pain: Secondary | ICD-10-CM

## 2015-08-01 DIAGNOSIS — F419 Anxiety disorder, unspecified: Secondary | ICD-10-CM

## 2015-08-01 DIAGNOSIS — M542 Cervicalgia: Secondary | ICD-10-CM | POA: Diagnosis not present

## 2015-08-01 MED ORDER — CLONAZEPAM 1 MG PO TABS: 1.0000 mg | ORAL_TABLET | Freq: Two times a day (BID) | ORAL | Status: DC | PRN

## 2015-08-01 MED ORDER — OXYCODONE HCL 10 MG PO TABS: 10.0000 mg | ORAL_TABLET | Freq: Three times a day (TID) | ORAL | Status: DC | PRN

## 2015-08-01 NOTE — Progress Notes (Signed)
Name: Annette Arnold   MRN: [id number]    DOB: Jul 26, 1961   Date:08/01/2015       Progress Note  Subjective  Chief Complaint  Chief Complaint  Patient presents with  . Follow-up    1 mo  . Medication Refill  . Hyperlipidemia    Neck Pain  This is a chronic problem. The problem has been unchanged. The pain is present in the right side and left side. The quality of the pain is described as burning, stabbing and aching. The pain is at a severity of 3/10. The pain is mild. Associated symptoms include chest pain. She has tried oral narcotics for the symptoms.  Anxiety Symptoms include chest pain, excessive worry, insomnia, irritability, nervous/anxious behavior, panic and shortness of breath. The severity of symptoms is moderate. The quality of sleep is fair.   Past treatments include benzodiazephines. The treatment provided moderate relief. Compliance with prior treatments has been good.      Past Medical History  Diagnosis Date  . Anxiety   . Chronic pain   . Hyperlipidemia     Past Surgical History  Procedure Laterality Date  . Breast lumpectomy    . Abdominal hysterectomy    . Carpal tunnel release    . Cholecystectomy    . Appendectomy      Family History  Problem Relation Age of Onset  . Cancer Mother   . Diabetes Mother   . Thyroid disease Mother   . Heart disease Father     History   Social History  . Marital Status: Married    Spouse Name: N/A  . Number of Children: N/A  . Years of Education: N/A   Occupational History  . Not on file.   Social History Main Topics  . Smoking status: Current Every Day Smoker -- 1.00 packs/day for 30 years    Types: Cigarettes  . Smokeless tobacco: Never Used  . Alcohol Use: No  . Drug Use: No  . Sexual Activity: Yes   Other Topics Concern  . Not on file   Social History Narrative     Current outpatient prescriptions:  .  clobetasol cream (TEMOVATE) 0.05 %, , Disp: , Rfl: 0 .  clonazePAM (KLONOPIN) 1 MG  tablet, Take 1 tablet (1 mg total) by mouth 2 (two) times daily as needed for anxiety., Disp: 60 tablet, Rfl: 0 .  naproxen (NAPROSYN) 500 MG tablet, Take 1 tablet (500 mg total) by mouth 2 (two) times daily with a meal., Disp: 20 tablet, Rfl: 0 .  Oxycodone HCl 10 MG TABS, Take 1 tablet (10 mg total) by mouth 3 (three) times daily as needed., Disp: 90 tablet, Rfl: 0 .  pravastatin (PRAVACHOL) 40 MG tablet, Take 1 tablet by mouth daily., Disp: , Rfl:  .  tiZANidine (ZANAFLEX) 4 MG tablet, Take 1 tablet (4 mg total) by mouth every 8 (eight) hours as needed for muscle spasms., Disp: 90 tablet, Rfl: 0  Allergies  Allergen Reactions  . Codeine Nausea And Vomiting     Review of Systems  Constitutional: Positive for irritability.  Respiratory: Positive for shortness of breath.   Cardiovascular: Positive for chest pain.  Musculoskeletal: Positive for neck pain.  Psychiatric/Behavioral: The patient is nervous/anxious and has insomnia.       Objective  Filed Vitals:   08/01/15 1224  BP: 100/70  Pulse: 72  Temp: 98.6 F (37 C)  TempSrc: Oral  Resp: 17  Height: 5\' 1"  (1.549 m)  Weight: 104  lb (47.174 kg)  SpO2: 97%    Physical Exam  Constitutional: She is oriented to person, place, and time and well-developed, well-nourished, and in no distress.  Cardiovascular: Normal rate and regular rhythm.   Pulmonary/Chest: Effort normal and breath sounds normal.  Musculoskeletal:       Back:  Neurological: She is alert and oriented to person, place, and time.  Psychiatric: Affect and judgment normal.  Nursing note and vitals reviewed.     Assessment & Plan 1. Anxiety Symptoms are stable and controlled on present therapy. Refills provided. Pt. is taking the medication as directed and is aware of the dependence potential of benzodiazepines and their interactions with other medications especially opioids. Follow-up in one month - clonazePAM (KLONOPIN) 1 MG tablet; Take 1 tablet (1 mg  total) by mouth 2 (two) times daily as needed for anxiety.  Dispense: 60 tablet; Refill: 0  2. Chronic neck and back pain Symptoms stable and responsive to present opioid therapy. Patient is taking the medication as directed and is aware of the potential interactions and side effects of opioids. Refills provided and follow-up in one month - Oxycodone HCl 10 MG TABS; Take 1 tablet (10 mg total) by mouth 3 (three) times daily as needed.  Dispense: 90 tablet; Refill: 0     Barnet Benavides [facility] 08/01/2015 12:37 PM

## 2015-08-02 ENCOUNTER — Ambulatory Visit: Payer: Medicare Other | Admitting: Family Medicine

## 2015-08-07 ENCOUNTER — Ambulatory Visit: Payer: Medicare Other | Admitting: Family Medicine

## 2015-08-29 ENCOUNTER — Encounter: Payer: Self-pay | Admitting: Family Medicine

## 2015-08-29 ENCOUNTER — Ambulatory Visit (INDEPENDENT_AMBULATORY_CARE_PROVIDER_SITE_OTHER): Payer: Medicare Other | Admitting: Family Medicine

## 2015-08-29 VITALS — BP 100/70 | HR 73 | Temp 98.3°F | Resp 17 | Ht 61.0 in | Wt 103.2 lb

## 2015-08-29 DIAGNOSIS — F419 Anxiety disorder, unspecified: Secondary | ICD-10-CM

## 2015-08-29 DIAGNOSIS — M542 Cervicalgia: Secondary | ICD-10-CM | POA: Diagnosis not present

## 2015-08-29 DIAGNOSIS — G8929 Other chronic pain: Secondary | ICD-10-CM

## 2015-08-29 DIAGNOSIS — E785 Hyperlipidemia, unspecified: Secondary | ICD-10-CM | POA: Diagnosis not present

## 2015-08-29 MED ORDER — CLONAZEPAM 1 MG PO TABS: 1.0000 mg | ORAL_TABLET | Freq: Two times a day (BID) | ORAL | Status: DC | PRN

## 2015-08-29 MED ORDER — OXYCODONE HCL 10 MG PO TABS: 10.0000 mg | ORAL_TABLET | Freq: Three times a day (TID) | ORAL | Status: DC | PRN

## 2015-08-29 MED ORDER — TIZANIDINE HCL 4 MG PO TABS: 4.0000 mg | ORAL_TABLET | Freq: Three times a day (TID) | ORAL | Status: DC | PRN

## 2015-08-29 NOTE — Progress Notes (Signed)
Name: Annette Arnold   MRN: [id number]    DOB: Sep 10, 1961   Date:08/29/2015       Progress Note  Subjective  Chief Complaint  Chief Complaint  Patient presents with  . Follow-up    4 wk  . Hyperlipidemia  . Anxiety  . Medication Refill    oxycodone 10 mg / clonazepam 1 mg    Hyperlipidemia This is a chronic problem. Pertinent negatives include no myalgias or shortness of breath. Current antihyperlipidemic treatment includes statins.  Anxiety Presents for follow-up visit. Symptoms include excessive worry, insomnia and nervous/anxious behavior. Patient reports no panic or shortness of breath.   Past treatments include benzodiazephines.  Neck Pain  This is a chronic problem. The problem has been unchanged. The quality of the pain is described as aching and cramping. The pain is at a severity of 3/10. She has tried oral narcotics for the symptoms.    Past Medical History  Diagnosis Date  . Anxiety   . Chronic pain   . Hyperlipidemia     Past Surgical History  Procedure Laterality Date  . Breast lumpectomy    . Abdominal hysterectomy    . Carpal tunnel release    . Cholecystectomy    . Appendectomy      Family History  Problem Relation Age of Onset  . Cancer Mother   . Diabetes Mother   . Thyroid disease Mother   . Heart disease Father     Social History   Social History  . Marital Status: Married    Spouse Name: N/A  . Number of Children: N/A  . Years of Education: N/A   Occupational History  . Not on file.   Social History Main Topics  . Smoking status: Current Every Day Smoker -- 1.00 packs/day for 30 years    Types: Cigarettes  . Smokeless tobacco: Never Used  . Alcohol Use: No  . Drug Use: No  . Sexual Activity: Yes   Other Topics Concern  . Not on file   Social History Narrative     Current outpatient prescriptions:  .  clobetasol cream (TEMOVATE) 0.05 %, , Disp: , Rfl: 0 .  clonazePAM (KLONOPIN) 1 MG tablet, Take 1 tablet (1 mg total) by  mouth 2 (two) times daily as needed for anxiety., Disp: 60 tablet, Rfl: 0 .  naproxen (NAPROSYN) 500 MG tablet, Take 1 tablet (500 mg total) by mouth 2 (two) times daily with a meal., Disp: 20 tablet, Rfl: 0 .  Oxycodone HCl 10 MG TABS, Take 1 tablet (10 mg total) by mouth 3 (three) times daily as needed., Disp: 90 tablet, Rfl: 0 .  pravastatin (PRAVACHOL) 40 MG tablet, Take 1 tablet by mouth daily., Disp: , Rfl:  .  tiZANidine (ZANAFLEX) 4 MG tablet, Take 1 tablet (4 mg total) by mouth every 8 (eight) hours as needed for muscle spasms., Disp: 90 tablet, Rfl: 0  Allergies  Allergen Reactions  . Codeine Nausea And Vomiting     Review of Systems  Respiratory: Negative for shortness of breath.   Musculoskeletal: Positive for neck pain. Negative for myalgias.  Psychiatric/Behavioral: The patient is nervous/anxious and has insomnia.       Objective  Filed Vitals:   08/29/15 1133  BP: 100/70  Pulse: 73  Temp: 98.3 F (36.8 C)  TempSrc: Oral  Resp: 17  Height: 5\' 1"  (1.549 m)  Weight: 103 lb 3.2 oz (46.811 kg)  SpO2: 97%    Physical Exam  Constitutional:  She is oriented to person, place, and time and well-developed, well-nourished, and in no distress.  Cardiovascular: Normal rate and regular rhythm.   Pulmonary/Chest: Effort normal and breath sounds normal.  Musculoskeletal:       Cervical back: She exhibits pain and spasm.       Back:  Neurological: She is alert and oriented to person, place, and time.  Skin: Skin is warm and dry.  Psychiatric: Affect and judgment normal.  Nursing note and vitals reviewed.   Assessment & Plan  1. Anxiety  Symptoms stable on present therapy. Patient aware of the dependence potential and the side effects of clonazepam. Refills provided and follow-up in one month.  - clonazePAM (KLONOPIN) 1 MG tablet; Take 1 tablet (1 mg total) by mouth 2 (two) times daily as needed for anxiety.  Dispense: 60 tablet; Refill: 0  2. Chronic neck  pain Symptoms stable on present opioid therapy. Patient aware of the dependence potential, drug interactions, and side effects of opioids. Refills provided and follow-up in one month.  - Oxycodone HCl 10 MG TABS; Take 1 tablet (10 mg total) by mouth 3 (three) times daily as needed.  Dispense: 90 tablet; Refill: 0 - tiZANidine (ZANAFLEX) 4 MG tablet; Take 1 tablet (4 mg total) by mouth every 8 (eight) hours as needed for muscle spasms.  Dispense: 90 tablet; Refill: 0  3. Dyslipidemia  - Comprehensive Metabolic Panel (CMET) - Lipid Profile   Annette Arnold [facility] 08/29/2015 12:00 PM

## 2015-09-29 ENCOUNTER — Ambulatory Visit: Payer: Medicare Other | Admitting: Family Medicine

## 2015-10-04 ENCOUNTER — Encounter: Payer: Self-pay | Admitting: Family Medicine

## 2015-10-04 ENCOUNTER — Ambulatory Visit (INDEPENDENT_AMBULATORY_CARE_PROVIDER_SITE_OTHER): Payer: Medicare Other | Admitting: Family Medicine

## 2015-10-04 VITALS — BP 100/68 | HR 85 | Temp 98.2°F | Resp 18 | Ht 61.0 in | Wt 104.5 lb

## 2015-10-04 DIAGNOSIS — M542 Cervicalgia: Secondary | ICD-10-CM

## 2015-10-04 DIAGNOSIS — G8929 Other chronic pain: Secondary | ICD-10-CM | POA: Diagnosis not present

## 2015-10-04 DIAGNOSIS — F419 Anxiety disorder, unspecified: Secondary | ICD-10-CM

## 2015-10-04 MED ORDER — ALPRAZOLAM 0.5 MG PO TABS: 0.5000 mg | ORAL_TABLET | Freq: Two times a day (BID) | ORAL | Status: DC | PRN

## 2015-10-04 MED ORDER — OXYCODONE HCL 10 MG PO TABS: 10.0000 mg | ORAL_TABLET | Freq: Three times a day (TID) | ORAL | Status: DC | PRN

## 2015-10-04 NOTE — Progress Notes (Signed)
Name: Annette Arnold   MRN: [id number]    DOB: 04-23-1961   Date:10/04/2015       Progress Note  Subjective  Chief Complaint  Chief Complaint  Patient presents with  . Follow-up    1 mo  . Medication Reaction    clonazepam 1 mg / oxycodone 10mg     Neck Pain  This is a chronic problem. The problem has been gradually worsening. The pain is present in the midline. The quality of the pain is described as burning. The pain is at a severity of 8/10. Pertinent negatives include no chest pain, paresis or weakness. She has tried oral narcotics for the symptoms. The treatment provided moderate relief.  Anxiety Presents for follow-up visit. Symptoms include excessive worry, insomnia, nervous/anxious behavior, panic and shortness of breath. Patient reports no chest pain.   Past treatments include benzodiazephines. The treatment provided moderate relief. Compliance with prior treatments has been good. Prior compliance problems include medication issues (pt. believes Klonopin makes her feel sluggish throughout the day and is wondering if that can be changed to a different medication.).   Past Medical History  Diagnosis Date  . Anxiety   . Chronic pain   . Hyperlipidemia     Past Surgical History  Procedure Laterality Date  . Breast lumpectomy    . Abdominal hysterectomy    . Carpal tunnel release    . Cholecystectomy    . Appendectomy      Family History  Problem Relation Age of Onset  . Cancer Mother   . Diabetes Mother   . Thyroid disease Mother   . Heart disease Father     Social History   Social History  . Marital Status: Married    Spouse Name: N/A  . Number of Children: N/A  . Years of Education: N/A   Occupational History  . Not on file.   Social History Main Topics  . Smoking status: Current Every Day Smoker -- 1.00 packs/day for 30 years    Types: Cigarettes  . Smokeless tobacco: Never Used  . Alcohol Use: No  . Drug Use: No  . Sexual Activity: Yes   Other  Topics Concern  . Not on file   Social History Narrative    Current outpatient prescriptions:  .  clobetasol cream (TEMOVATE) 0.05 %, , Disp: , Rfl: 0 .  clonazePAM (KLONOPIN) 1 MG tablet, Take 1 tablet (1 mg total) by mouth 2 (two) times daily as needed for anxiety., Disp: 60 tablet, Rfl: 0 .  Oxycodone HCl 10 MG TABS, Take 1 tablet (10 mg total) by mouth 3 (three) times daily as needed., Disp: 90 tablet, Rfl: 0 .  pravastatin (PRAVACHOL) 40 MG tablet, Take 1 tablet by mouth daily., Disp: , Rfl:  .  tiZANidine (ZANAFLEX) 4 MG tablet, Take 1 tablet (4 mg total) by mouth every 8 (eight) hours as needed for muscle spasms., Disp: 90 tablet, Rfl: 0  Allergies  Allergen Reactions  . Codeine Nausea And Vomiting   Review of Systems  Respiratory: Positive for shortness of breath.   Cardiovascular: Negative for chest pain.  Musculoskeletal: Positive for neck pain.  Neurological: Negative for weakness.  Psychiatric/Behavioral: The patient is nervous/anxious and has insomnia.    Objective  Filed Vitals:   10/04/15 1145  BP: 100/68  Pulse: 85  Temp: 98.2 F (36.8 C)  TempSrc: Oral  Resp: 18  Height: 5\' 1"  (1.549 m)  Weight: 104 lb 8 oz (47.401 kg)  SpO2: 97%  Physical Exam  Constitutional: She is well-developed, well-nourished, and in no distress.  Musculoskeletal:       Cervical back: She exhibits tenderness, bony tenderness, pain and spasm.  Nursing note and vitals reviewed.  Assessment & Plan   1. Anxiety DC clonazepam because of side effects and start patient on alprazolam 0.5 mg twice a day as needed. Patient aware of the dependence potential and side effects of benzodiazepine therapy. Follow-up in one month. - ALPRAZolam (XANAX) 0.5 MG tablet; Take 1 tablet (0.5 mg total) by mouth 2 (two) times daily as needed for anxiety.  Dispense: 60 tablet; Refill: 0  2. Chronic neck pain Neck cervical spine pain, responsive to opioid therapy. Refills provided. Reordered MRI of  cervical spine for evaluation of pain. - Oxycodone HCl 10 MG TABS; Take 1 tablet (10 mg total) by mouth 3 (three) times daily as needed.  Dispense: 90 tablet; Refill: 0 - MR Cervical Spine Wo Contrast; Future   Mayson Sterbenz [facility] 10/04/2015 12:11 PM

## 2015-10-19 ENCOUNTER — Ambulatory Visit: Payer: Medicare Other

## 2015-10-20 ENCOUNTER — Telehealth: Payer: Self-pay

## 2015-10-20 NOTE — Telephone Encounter (Signed)
MRI is in general better suited for imaging of soft tissues such as nerves. In her case, MRI of cervical spine will be a better choice. However, if she does not feel comfortable with MRI of cervical spine, she can consult with the radiologist on site to determine if she can have the CT instead.

## 2015-10-20 NOTE — Telephone Encounter (Signed)
Pt would like to know if you can get the same pictures in a CT as a MRI, she doesn't feel comfortable in a MRI otherwise she would have to go to [facility]. If you can get the same test results in the CT, she would like to do that instead. Please advise. Thanks

## 2015-11-03 ENCOUNTER — Ambulatory Visit (INDEPENDENT_AMBULATORY_CARE_PROVIDER_SITE_OTHER): Payer: Medicare Other | Admitting: Family Medicine

## 2015-11-03 ENCOUNTER — Encounter: Payer: Self-pay | Admitting: Family Medicine

## 2015-11-03 VITALS — BP 100/66 | HR 102 | Temp 98.4°F | Resp 19 | Ht 61.0 in | Wt 107.8 lb

## 2015-11-03 DIAGNOSIS — M542 Cervicalgia: Secondary | ICD-10-CM | POA: Diagnosis not present

## 2015-11-03 DIAGNOSIS — R208 Other disturbances of skin sensation: Secondary | ICD-10-CM | POA: Diagnosis not present

## 2015-11-03 DIAGNOSIS — G8929 Other chronic pain: Secondary | ICD-10-CM

## 2015-11-03 DIAGNOSIS — F419 Anxiety disorder, unspecified: Secondary | ICD-10-CM | POA: Diagnosis not present

## 2015-11-03 MED ORDER — ALPRAZOLAM 0.5 MG PO TABS: 0.5000 mg | ORAL_TABLET | Freq: Two times a day (BID) | ORAL | Status: DC | PRN

## 2015-11-03 MED ORDER — OXYCODONE HCL 10 MG PO TABS: 10.0000 mg | ORAL_TABLET | Freq: Three times a day (TID) | ORAL | Status: DC | PRN

## 2015-11-03 NOTE — Progress Notes (Signed)
Name: Annette Arnold   MRN: [id number]    DOB: [birth year]   Date:11/03/2015       Progress Note  Subjective  Chief Complaint  Chief Complaint  Patient presents with  . Follow-up    1 mo  . Hyperlipidemia  . Medication Refill    oxycodone 10mg     Neck Pain  This is a chronic problem. The problem has been unchanged. The pain is present in the midline. The quality of the pain is described as burning. The pain is at a severity of 4/10. The symptoms are aggravated by position (turning her neck can bring on the pain.). Associated symptoms include headaches. Pertinent negatives include no chest pain, paresis, tingling or weakness. She has tried oral narcotics for the symptoms. The treatment provided moderate relief.  Anxiety Presents for follow-up visit. Symptoms include excessive worry, insomnia, nervous/anxious behavior, panic and shortness of breath. Patient reports no chest pain or dizziness.   Past treatments include benzodiazephines. The treatment provided significant relief. Compliance with prior treatments has been good.  Abnormal facial sensation Patient reports right-sided facial burning sensation since yesterday. Feels like her face is burning especially upon contact such as laying down on a pillow. Located at the bottom portion of the right eye, right cheek and the right temple area. Previously, she had similar symptoms on the left side of the face with left jaw pain which resolved. She denies any weakness, or numbness of the face. No speech changes. No changes in strength. No rash. Past Medical History  Diagnosis Date  . Anxiety   . Chronic pain   . Hyperlipidemia     Past Surgical History  Procedure Laterality Date  . Breast lumpectomy    . Abdominal hysterectomy    . Carpal tunnel release    . Cholecystectomy    . Appendectomy      Family History  Problem Relation Age of Onset  . Cancer Mother   . Diabetes Mother   . Thyroid disease Mother   . Heart disease Father       Social History   Social History  . Marital Status: Married    Spouse Name: N/A  . Number of Children: N/A  . Years of Education: N/A   Occupational History  . Not on file.   Social History Main Topics  . Smoking status: Current Every Day Smoker -- 1.00 packs/day for 30 years    Types: Cigarettes  . Smokeless tobacco: Never Used  . Alcohol Use: No  . Drug Use: No  . Sexual Activity: Yes   Other Topics Concern  . Not on file   Social History Narrative    Current outpatient prescriptions:  .  ALPRAZolam (XANAX) 0.5 MG tablet, Take 1 tablet (0.5 mg total) by mouth 2 (two) times daily as needed for anxiety., Disp: 60 tablet, Rfl: 0 .  clobetasol cream (TEMOVATE) 0.05 %, , Disp: , Rfl: 0 .  Oxycodone HCl 10 MG TABS, Take 1 tablet (10 mg total) by mouth 3 (three) times daily as needed., Disp: 90 tablet, Rfl: 0 .  pravastatin (PRAVACHOL) 40 MG tablet, Take 1 tablet by mouth daily., Disp: , Rfl:  .  tiZANidine (ZANAFLEX) 4 MG tablet, Take 1 tablet (4 mg total) by mouth every 8 (eight) hours as needed for muscle spasms., Disp: 90 tablet, Rfl: 0  Allergies  Allergen Reactions  . Codeine Nausea And Vomiting    Review of Systems  Eyes: Negative for blurred vision and double vision.  Respiratory: Positive for shortness of breath.   Cardiovascular: Negative for chest pain.  Musculoskeletal: Positive for neck pain.  Neurological: Positive for headaches. Negative for dizziness, tingling, sensory change, speech change, focal weakness and weakness.  Psychiatric/Behavioral: The patient is nervous/anxious and has insomnia.      Objective  Filed Vitals:   11/03/15 0911  BP: 100/66  Pulse: 102  Temp: 98.4 F (36.9 C)  TempSrc: Oral  Resp: 19  Height: 5\' 1"  (1.549 m)  Weight: 107 lb 12.8 oz (48.898 kg)  SpO2: 96%    Physical Exam  Constitutional: She is oriented to person, place, and time and well-developed, well-nourished, and in no distress.  HENT:  Head:  Normocephalic and atraumatic.    Subjective sensation of burning on the right side of face around the right eye, no rashes  Eyes: Conjunctivae and EOM are normal. Pupils are equal, round, and reactive to light.  Neck: Normal range of motion. Neck supple.  Cardiovascular: Normal rate, regular rhythm and normal heart sounds.   Pulmonary/Chest: Effort normal and breath sounds normal.  Musculoskeletal:       Cervical back: She exhibits tenderness, pain and spasm.       Back:  Neurological: She is alert and oriented to person, place, and time. She has intact cranial nerves. No cranial nerve deficit.  Psychiatric: Mood, memory, affect and judgment normal.  Nursing note and vitals reviewed.   Assessment & Plan  1. Chronic neck pain Chronic cervical spine pain, stable and controlled on daily opioid therapy. Patient compliant with controlled substances agreement. She is aware of the dependence potential, side effects, and drug interactions of opioids, especially with benzodiazepines. Refills provided and follow-up in one month - Oxycodone HCl 10 MG TABS; Take 1 tablet (10 mg total) by mouth 3 (three) times daily as needed.  Dispense: 90 tablet; Refill: 0  2. Anxiety Symptoms of anxiety are better controlled on alprazolam. No apparent side effects. She is aware of the dependence potential of benzodiazepines, and drug interactions. Refills provided and follow-up in one month. - ALPRAZolam (XANAX) 0.5 MG tablet; Take 1 tablet (0.5 mg total) by mouth 2 (two) times daily as needed for anxiety.  Dispense: 60 tablet; Refill: 0  3. Facial burning Normal cranial nerve exam. Recommended that patient be referred to neurology, but patient would like to wait at this time no symptoms concerning for stroke/TIA.    Maurie Musco [facility] 11/03/2015 9:18 AM

## 2015-11-10 ENCOUNTER — Encounter: Payer: Self-pay | Admitting: Family Medicine

## 2015-11-10 ENCOUNTER — Ambulatory Visit (INDEPENDENT_AMBULATORY_CARE_PROVIDER_SITE_OTHER): Payer: Medicare Other | Admitting: Family Medicine

## 2015-11-10 VITALS — BP 110/70 | HR 93 | Temp 98.1°F | Resp 18 | Ht 61.0 in | Wt 108.1 lb

## 2015-11-10 DIAGNOSIS — H5711 Ocular pain, right eye: Secondary | ICD-10-CM | POA: Diagnosis not present

## 2015-11-10 DIAGNOSIS — H0289 Other specified disorders of eyelid: Secondary | ICD-10-CM | POA: Insufficient documentation

## 2015-11-10 NOTE — Progress Notes (Signed)
Name: Annette Arnold   MRN: [id number]    DOB: [birth year]   Date:11/10/2015       Progress Note  Subjective  Chief Complaint  Chief Complaint  Patient presents with  . Eye Pain    since Wed    Eye Pain  Both eyes are affected.This is a new problem. Episode onset: 2 days ago. There was no injury mechanism. The pain is moderate. There is no known exposure to pink eye. Associated symptoms include itching and photophobia. Pertinent negatives include no blurred vision (got blurry a few times), eye discharge, double vision, eye redness, fever or recent URI. Associated symptoms comments: Right eye started swelling and now left eye has also started swelling.. She has tried eye drops for the symptoms. The treatment provided no relief.    Past Medical History  Diagnosis Date  . Anxiety   . Chronic pain   . Hyperlipidemia     Past Surgical History  Procedure Laterality Date  . Breast lumpectomy    . Abdominal hysterectomy    . Carpal tunnel release    . Cholecystectomy    . Appendectomy      Family History  Problem Relation Age of Onset  . Cancer Mother   . Diabetes Mother   . Thyroid disease Mother   . Heart disease Father     Social History   Social History  . Marital Status: Married    Spouse Name: N/A  . Number of Children: N/A  . Years of Education: N/A   Occupational History  . Not on file.   Social History Main Topics  . Smoking status: Current Every Day Smoker -- 1.00 packs/day for 30 years    Types: Cigarettes  . Smokeless tobacco: Never Used  . Alcohol Use: No  . Drug Use: No  . Sexual Activity: Yes   Other Topics Concern  . Not on file   Social History Narrative     Current outpatient prescriptions:  .  ALPRAZolam (XANAX) 0.5 MG tablet, Take 1 tablet (0.5 mg total) by mouth 2 (two) times daily as needed for anxiety., Disp: 60 tablet, Rfl: 0 .  clobetasol cream (TEMOVATE) 0.05 %, , Disp: , Rfl: 0 .  Oxycodone HCl 10 MG TABS, Take 1 tablet (10 mg  total) by mouth 3 (three) times daily as needed., Disp: 90 tablet, Rfl: 0 .  pravastatin (PRAVACHOL) 40 MG tablet, Take 1 tablet by mouth daily., Disp: , Rfl:  .  tiZANidine (ZANAFLEX) 4 MG tablet, Take 1 tablet (4 mg total) by mouth every 8 (eight) hours as needed for muscle spasms., Disp: 90 tablet, Rfl: 0  Allergies  Allergen Reactions  . Codeine Nausea And Vomiting   Review of Systems  Constitutional: Negative for fever and chills.  HENT: Negative for congestion, ear pain and sore throat.   Eyes: Positive for photophobia and pain. Negative for blurred vision (got blurry a few times), double vision, discharge and redness.  Respiratory: Negative for cough.   Skin: Positive for itching.  Neurological: Negative for headaches.    Objective  Filed Vitals:   11/10/15 0913  BP: 110/70  Pulse: 93  Temp: 98.1 F (36.7 C)  Resp: 18  Height: 5\' 1"  (1.549 m)  Weight: 108 lb 2 oz (49.045 kg)  SpO2: 97%    Physical Exam  Constitutional: She is well-developed, well-nourished, and in no distress.  Eyes: Conjunctivae and EOM are normal. Pupils are equal, round, and reactive to light. Lids are everted  and swept, no foreign bodies found. Right eye exhibits no chemosis, no discharge and no exudate. No foreign body present in the right eye. Left eye exhibits no chemosis, no discharge and no exudate. No foreign body present in the left eye. Right conjunctiva has no hemorrhage. Left conjunctiva has no hemorrhage.  Tenderness to palpation over the eyelids and inferioir area of lower eyelid, no discharge visible,   Cardiovascular: Normal rate, regular rhythm and normal heart sounds.   Pulmonary/Chest: Effort normal and breath sounds normal. She has no wheezes.  Nursing note and vitals reviewed.   Assessment & Plan  1. Pain of right eyelid Unclear etiology of right eyelid swelling. Patient's symptoms and presentation discussed with Dr. Murvin Natal of [facility]. He will see the patient  at 2 PM today. Patient informed of the appointment and advised to follow-up.  - Ambulatory referral to Ophthalmology   [facility] 11/10/2015 10:40 AM

## 2015-11-10 NOTE — Telephone Encounter (Signed)
Pt is scheduled for her MRI on 11-18-15 at [facility] and she is aware of her appt

## 2015-11-14 ENCOUNTER — Ambulatory Visit: Payer: Medicare Other | Admitting: Family Medicine

## 2015-11-18 ENCOUNTER — Other Ambulatory Visit: Payer: Medicare Other

## 2015-12-01 ENCOUNTER — Encounter: Payer: Self-pay | Admitting: Family Medicine

## 2015-12-01 ENCOUNTER — Ambulatory Visit (INDEPENDENT_AMBULATORY_CARE_PROVIDER_SITE_OTHER): Payer: Medicare Other | Admitting: Family Medicine

## 2015-12-01 VITALS — BP 108/75 | HR 94 | Temp 98.0°F | Resp 17 | Ht 61.0 in | Wt 111.0 lb

## 2015-12-01 DIAGNOSIS — F419 Anxiety disorder, unspecified: Secondary | ICD-10-CM | POA: Diagnosis not present

## 2015-12-01 DIAGNOSIS — G8929 Other chronic pain: Secondary | ICD-10-CM | POA: Diagnosis not present

## 2015-12-01 DIAGNOSIS — M542 Cervicalgia: Secondary | ICD-10-CM | POA: Diagnosis not present

## 2015-12-01 MED ORDER — ALPRAZOLAM 0.5 MG PO TABS: 0.5000 mg | ORAL_TABLET | Freq: Two times a day (BID) | ORAL | Status: DC | PRN

## 2015-12-01 MED ORDER — OXYCODONE HCL 10 MG PO TABS: 10.0000 mg | ORAL_TABLET | Freq: Three times a day (TID) | ORAL | Status: DC | PRN

## 2015-12-01 NOTE — Progress Notes (Signed)
Name: Annette Arnold   MRN: [id number]    DOB: 1961-09-23   Date:12/01/2015       Progress Note  Subjective  Chief Complaint  Chief Complaint  Patient presents with  . Follow-up    1 mo  . Hyperlipidemia  . Anxiety  . Medication Refill    xanax 0.5mg  / oxycodone 10mg     Anxiety Presents for follow-up visit. Symptoms include excessive worry, insomnia, nervous/anxious behavior, panic and shortness of breath. Patient reports no chest pain or dizziness.   Past treatments include benzodiazephines. The treatment provided significant relief.  Neck Pain  This is a chronic problem. The problem has been unchanged. The pain is present in the midline. The quality of the pain is described as burning. The pain is at a severity of 4/10. The symptoms are aggravated by position (turning her neck can bring on the pain.). Pertinent negatives include no chest pain, fever, paresis or tingling. She has tried oral narcotics and heat for the symptoms. The treatment provided moderate relief.    Past Medical History  Diagnosis Date  . Anxiety   . Chronic pain   . Hyperlipidemia     Past Surgical History  Procedure Laterality Date  . Breast lumpectomy    . Abdominal hysterectomy    . Carpal tunnel release    . Cholecystectomy    . Appendectomy      Family History  Problem Relation Age of Onset  . Cancer Mother   . Diabetes Mother   . Thyroid disease Mother   . Heart disease Father     Social History   Social History  . Marital Status: Married    Spouse Name: N/A  . Number of Children: N/A  . Years of Education: N/A   Occupational History  . Not on file.   Social History Main Topics  . Smoking status: Current Every Day Smoker -- 1.00 packs/day for 30 years    Types: Cigarettes  . Smokeless tobacco: Never Used  . Alcohol Use: No  . Drug Use: No  . Sexual Activity: Yes   Other Topics Concern  . Not on file   Social History Narrative     Current outpatient prescriptions:  .   ALPRAZolam (XANAX) 0.5 MG tablet, Take 1 tablet (0.5 mg total) by mouth 2 (two) times daily as needed for anxiety., Disp: 60 tablet, Rfl: 0 .  clobetasol cream (TEMOVATE) 0.05 %, , Disp: , Rfl: 0 .  Oxycodone HCl 10 MG TABS, Take 1 tablet (10 mg total) by mouth 3 (three) times daily as needed., Disp: 90 tablet, Rfl: 0 .  pravastatin (PRAVACHOL) 40 MG tablet, Take 1 tablet by mouth daily., Disp: , Rfl:  .  tiZANidine (ZANAFLEX) 4 MG tablet, Take 1 tablet (4 mg total) by mouth every 8 (eight) hours as needed for muscle spasms., Disp: 90 tablet, Rfl: 0  Allergies  Allergen Reactions  . Codeine Nausea And Vomiting     Review of Systems  Constitutional: Negative for fever and chills.  Eyes: Negative for blurred vision and double vision.  Respiratory: Positive for shortness of breath.   Cardiovascular: Negative for chest pain.  Musculoskeletal: Positive for back pain and neck pain.  Neurological: Negative for dizziness and tingling.  Psychiatric/Behavioral: The patient is nervous/anxious and has insomnia.    Objective  Filed Vitals:   12/01/15 0907  BP: 108/75  Pulse: 94  Temp: 98 F (36.7 C)  TempSrc: Oral  Resp: 17  Height: 5\' 1"  (  1.549 m)  Weight: 111 lb (50.349 kg)  SpO2: 96%    Physical Exam  Constitutional: She is well-developed, well-nourished, and in no distress.  Cardiovascular: Normal rate and regular rhythm.   Pulmonary/Chest: Effort normal and breath sounds normal.  Musculoskeletal:       Cervical back: She exhibits tenderness, pain and spasm.       Back:  Psychiatric: Mood and affect normal.  Nursing note and vitals reviewed.   Assessment & Plan  1. Anxiety Symptoms stable on alprazolam taken twice daily as needed. Refills provided. - ALPRAZolam (XANAX) 0.5 MG tablet; Take 1 tablet (0.5 mg total) by mouth 2 (two) times daily as needed for anxiety.  Dispense: 60 tablet; Refill: 0  2. Chronic neck pain Pain controlled on oxycodone 10 mg taken 3 times daily  as needed. patient compliant with controlled substances agreement and understands their dependence potential, drug interactions, and side effects.Refills provided. Follow-up in 3 months. - Oxycodone HCl 10 MG TABS; Take 1 tablet (10 mg total) by mouth 3 (three) times daily as needed.  Dispense: 90 tablet; Refill: 0   Abryana Lykens [facility] 12/01/2015 9:17 AM

## 2015-12-27 ENCOUNTER — Other Ambulatory Visit: Payer: Self-pay | Admitting: Family Medicine

## 2015-12-27 DIAGNOSIS — F419 Anxiety disorder, unspecified: Secondary | ICD-10-CM

## 2015-12-27 DIAGNOSIS — M542 Cervicalgia: Secondary | ICD-10-CM

## 2015-12-27 DIAGNOSIS — G8929 Other chronic pain: Secondary | ICD-10-CM

## 2015-12-27 MED ORDER — OXYCODONE HCL 10 MG PO TABS: 10.0000 mg | ORAL_TABLET | Freq: Three times a day (TID) | ORAL | Status: DC | PRN

## 2015-12-27 MED ORDER — ALPRAZOLAM 0.5 MG PO TABS: 0.5000 mg | ORAL_TABLET | Freq: Two times a day (BID) | ORAL | Status: DC | PRN

## 2015-12-27 NOTE — Telephone Encounter (Signed)
Routed to Dr. Shah for approval 

## 2015-12-27 NOTE — Telephone Encounter (Signed)
Requesting refill on oxycodone and xanax. Would like to pick up this Friday because we will be closed on Monday. She also would like to remind you that some months have a fifth week in it.

## 2016-01-26 ENCOUNTER — Other Ambulatory Visit: Payer: Self-pay

## 2016-01-26 DIAGNOSIS — F419 Anxiety disorder, unspecified: Secondary | ICD-10-CM

## 2016-01-26 DIAGNOSIS — G8929 Other chronic pain: Secondary | ICD-10-CM

## 2016-01-26 DIAGNOSIS — M542 Cervicalgia: Secondary | ICD-10-CM

## 2016-01-26 MED ORDER — ALPRAZOLAM 0.5 MG PO TABS: 0.5000 mg | ORAL_TABLET | Freq: Two times a day (BID) | ORAL | Status: DC | PRN

## 2016-01-26 MED ORDER — CLOBETASOL PROPIONATE 0.05 % EX CREA: TOPICAL_CREAM | Freq: Two times a day (BID) | CUTANEOUS | Status: DC

## 2016-01-26 MED ORDER — OXYCODONE HCL 10 MG PO TABS: 10.0000 mg | ORAL_TABLET | Freq: Three times a day (TID) | ORAL | Status: DC | PRN

## 2016-01-26 NOTE — Telephone Encounter (Signed)
Routed to Dr. Shah for approval 

## 2016-02-08 ENCOUNTER — Telehealth: Payer: Self-pay | Admitting: Family Medicine

## 2016-02-08 NOTE — Telephone Encounter (Signed)
Routed to Dr. Manuella Ghazi for new prescription request

## 2016-02-08 NOTE — Telephone Encounter (Signed)
Have medicare part a, b, d along with [employer]. Requesting that dr Manuella Ghazi write a prescription for medical heat/massage pad for her neck. States that she has a standard one but it does not really do the job.

## 2016-02-09 NOTE — Telephone Encounter (Signed)
Please schedule for an appointment to discuss the massage pad/medical heat and please have her bring any pertinent documentation.

## 2016-02-20 ENCOUNTER — Other Ambulatory Visit: Payer: Self-pay | Admitting: Family Medicine

## 2016-02-22 ENCOUNTER — Telehealth: Payer: Self-pay | Admitting: Family Medicine

## 2016-02-22 ENCOUNTER — Other Ambulatory Visit: Payer: Self-pay | Admitting: Family Medicine

## 2016-02-22 DIAGNOSIS — G8929 Other chronic pain: Secondary | ICD-10-CM

## 2016-02-22 DIAGNOSIS — F419 Anxiety disorder, unspecified: Secondary | ICD-10-CM

## 2016-02-22 DIAGNOSIS — M542 Cervicalgia: Secondary | ICD-10-CM

## 2016-02-22 MED ORDER — OXYCODONE HCL 10 MG PO TABS: 10.0000 mg | ORAL_TABLET | Freq: Three times a day (TID) | ORAL | Status: DC | PRN

## 2016-02-22 MED ORDER — ALPRAZOLAM 0.5 MG PO TABS: 0.5000 mg | ORAL_TABLET | Freq: Two times a day (BID) | ORAL | Status: DC | PRN

## 2016-02-22 NOTE — Telephone Encounter (Signed)
Routed to Dr. Shah for approval 

## 2016-02-22 NOTE — Telephone Encounter (Signed)
Pt needs refill on Alprazolam and Oxycodone. Pt understands she is not  due to pick up until 02/26/2016 but wanted to give our office time to get this ready for her.

## 2016-02-29 ENCOUNTER — Ambulatory Visit (INDEPENDENT_AMBULATORY_CARE_PROVIDER_SITE_OTHER): Payer: Medicare Other | Admitting: Family Medicine

## 2016-02-29 ENCOUNTER — Encounter: Payer: Self-pay | Admitting: Family Medicine

## 2016-02-29 VITALS — BP 108/68 | HR 82 | Temp 98.5°F | Resp 17 | Ht 61.0 in | Wt 114.2 lb

## 2016-02-29 DIAGNOSIS — K5900 Constipation, unspecified: Secondary | ICD-10-CM | POA: Insufficient documentation

## 2016-02-29 DIAGNOSIS — M545 Low back pain, unspecified: Secondary | ICD-10-CM

## 2016-02-29 DIAGNOSIS — K5903 Drug induced constipation: Secondary | ICD-10-CM

## 2016-02-29 DIAGNOSIS — T402X5A Adverse effect of other opioids, initial encounter: Secondary | ICD-10-CM | POA: Diagnosis not present

## 2016-02-29 DIAGNOSIS — E785 Hyperlipidemia, unspecified: Secondary | ICD-10-CM | POA: Diagnosis not present

## 2016-02-29 DIAGNOSIS — G8929 Other chronic pain: Secondary | ICD-10-CM | POA: Diagnosis not present

## 2016-02-29 MED ORDER — LIDOCAINE 5 % EX PTCH: 1.0000 | MEDICATED_PATCH | CUTANEOUS | Status: DC

## 2016-02-29 MED ORDER — LUBIPROSTONE 24 MCG PO CAPS: 24.0000 ug | ORAL_CAPSULE | Freq: Two times a day (BID) | ORAL | Status: DC

## 2016-02-29 MED ORDER — PRAVASTATIN SODIUM 40 MG PO TABS: 40.0000 mg | ORAL_TABLET | Freq: Every day | ORAL | Status: DC

## 2016-02-29 NOTE — Progress Notes (Signed)
Name: Annette Arnold   MRN: [id number]    DOB: 1961-01-25   Date:02/29/2016       Progress Note  Subjective  Chief Complaint  Chief Complaint  Patient presents with  . Follow-up    Discuss therapy for anxiety and pain meds due to insurance    Hyperlipidemia This is a chronic problem. The problem is controlled. Recent lipid tests were reviewed and are normal. Pertinent negatives include no chest pain, leg pain or myalgias. Current antihyperlipidemic treatment includes statins. There are no compliance problems.   Constipation This is a recurrent problem. The current episode started more than 1 year ago (recently worse in last 2 months.). Her stool frequency is 2 to 3 times per week. She exercises regularly. There has not been adequate water intake. Associated symptoms include back pain. Pertinent negatives include no abdominal pain. She has tried stool softeners and laxatives (Has used OTC Stool softeners and Laxatives.) for the symptoms. There is no history of irritable bowel syndrome.  Back Pain This is a chronic problem. The problem is unchanged. The pain is present in the lumbar spine. The pain radiates to the right knee and right thigh. The pain is at a severity of 3/10. Pertinent negatives include no abdominal pain, chest pain, leg pain, paresis or paresthesias. Treatments tried: Oxycodone. Requesting refills for Lidoderm patches, prescribed in September 2014.   Believes Lidoderm patches helped relieve her back pain. First prescribed in September 2014.   Past Medical History  Diagnosis Date  . Anxiety   . Chronic pain   . Hyperlipidemia     Past Surgical History  Procedure Laterality Date  . Breast lumpectomy    . Abdominal hysterectomy    . Carpal tunnel release    . Cholecystectomy    . Appendectomy      Family History  Problem Relation Age of Onset  . Cancer Mother   . Diabetes Mother   . Thyroid disease Mother   . Heart disease Father     Social History   Social  History  . Marital Status: Married    Spouse Name: N/A  . Number of Children: N/A  . Years of Education: N/A   Occupational History  . Not on file.   Social History Main Topics  . Smoking status: Current Every Day Smoker -- 1.00 packs/day for 30 years    Types: Cigarettes  . Smokeless tobacco: Never Used  . Alcohol Use: No  . Drug Use: No  . Sexual Activity: Yes   Other Topics Concern  . Not on file   Social History Narrative     Current outpatient prescriptions:  .  ALPRAZolam (XANAX) 0.5 MG tablet, Take 1 tablet (0.5 mg total) by mouth 2 (two) times daily as needed for anxiety., Disp: 60 tablet, Rfl: 0 .  clobetasol cream (TEMOVATE) 0.05 %, Apply topically 2 (two) times daily., Disp: 30 g, Rfl: 0 .  Oxycodone HCl 10 MG TABS, Take 1 tablet (10 mg total) by mouth 3 (three) times daily as needed., Disp: 90 tablet, Rfl: 0 .  pravastatin (PRAVACHOL) 40 MG tablet, Take 1 tablet by mouth daily., Disp: , Rfl:  .  tiZANidine (ZANAFLEX) 4 MG tablet, Take 1 tablet (4 mg total) by mouth every 8 (eight) hours as needed for muscle spasms., Disp: 90 tablet, Rfl: 0  Allergies  Allergen Reactions  . Codeine Nausea And Vomiting     Review of Systems  Cardiovascular: Negative for chest pain.  Gastrointestinal: Positive for  constipation. Negative for abdominal pain.  Musculoskeletal: Positive for back pain. Negative for myalgias.  Neurological: Negative for paresthesias.     Objective  Filed Vitals:   02/29/16 0919  BP: 108/68  Pulse: 82  Temp: 98.5 F (36.9 C)  TempSrc: Oral  Resp: 17  Height: 5\' 1"  (1.549 m)  Weight: 114 lb 3.2 oz (51.801 kg)  SpO2: 97%    Physical Exam  Constitutional: She is oriented to person, place, and time and well-developed, well-nourished, and in no distress.  Cardiovascular: Normal rate and regular rhythm.   Pulmonary/Chest: Effort normal and breath sounds normal.  Abdominal: Soft. There is tenderness in the periumbilical area.    Mild  tenderness to palpation over the periumbilical and lower epigastric area.  Musculoskeletal:       Lumbar back: She exhibits tenderness and pain.       Back:  Neurological: She is alert and oriented to person, place, and time.  Nursing note and vitals reviewed.    Assessment & Plan  1. Chronic LBP We will provide Lidoderm patches to help relieve patient's back pain. Continue on opioid therapy. - lidocaine (LIDODERM) 5 %; Place 1 patch onto the skin daily. Remove & Discard patch within 12 hours or as directed by MD  Dispense: 30 patch; Refill: 0  2. Dyslipidemia  - pravastatin (PRAVACHOL) 40 MG tablet; Take 1 tablet (40 mg total) by mouth daily.  Dispense: 90 tablet; Refill: 0 - Lipid Profile - Comprehensive Metabolic Panel (CMET)  3. Constipation due to opioid therapy Recommended increasing fluid intake, will start on Amitiza for constipation. Follow-up in one month. - lubiprostone (AMITIZA) 24 MCG capsule; Take 1 capsule (24 mcg total) by mouth 2 (two) times daily with a meal.  Dispense: 60 capsule; Refill: 0   Sacred Roa [facility] 02/29/2016 9:44 AM

## 2016-03-07 DIAGNOSIS — E785 Hyperlipidemia, unspecified: Secondary | ICD-10-CM | POA: Diagnosis not present

## 2016-03-07 NOTE — Telephone Encounter (Signed)
errenous °

## 2016-03-08 LAB — COMPREHENSIVE METABOLIC PANEL
ALBUMIN: 4.3 g/dL (ref 3.5–5.5)
ALK PHOS: 122 IU/L — AB (ref 39–117)
ALT: 3 IU/L (ref 0–32)
AST: 14 IU/L (ref 0–40)
Albumin/Globulin Ratio: 1.4 (ref 1.1–2.5)
BUN / CREAT RATIO: 10 (ref 9–23)
BUN: 6 mg/dL (ref 6–24)
Bilirubin Total: 0.2 mg/dL (ref 0.0–1.2)
CO2: 19 mmol/L (ref 18–29)
CREATININE: 0.58 mg/dL (ref 0.57–1.00)
Calcium: 9.4 mg/dL (ref 8.7–10.2)
Chloride: 102 mmol/L (ref 96–106)
GFR calc Af Amer: 123 mL/min/{1.73_m2} (ref >59)
GFR calc non Af Amer: 106 mL/min/{1.73_m2} (ref >59)
GLUCOSE: 81 mg/dL (ref 65–99)
Globulin, Total: 3.1 g/dL (ref 1.5–4.5)
Potassium: 4.3 mmol/L (ref 3.5–5.2)
Sodium: 143 mmol/L (ref 134–144)
Total Protein: 7.4 g/dL (ref 6.0–8.5)

## 2016-03-08 LAB — LIPID PANEL
Chol/HDL Ratio: 5.7 ratio units — ABNORMAL HIGH (ref 0.0–4.4)
Cholesterol, Total: 262 mg/dL — ABNORMAL HIGH (ref 100–199)
HDL: 46 mg/dL (ref >39)
LDL Calculated: 174 mg/dL — ABNORMAL HIGH (ref 0–99)
Triglycerides: 208 mg/dL — ABNORMAL HIGH (ref 0–149)
VLDL CHOLESTEROL CAL: 42 mg/dL — AB (ref 5–40)

## 2016-03-14 ENCOUNTER — Telehealth: Payer: Self-pay

## 2016-03-14 DIAGNOSIS — G8929 Other chronic pain: Secondary | ICD-10-CM

## 2016-03-14 DIAGNOSIS — F1190 Opioid use, unspecified, uncomplicated: Secondary | ICD-10-CM

## 2016-03-14 DIAGNOSIS — M545 Low back pain, unspecified: Secondary | ICD-10-CM

## 2016-03-14 MED ORDER — LIDOCAINE 5 % EX PTCH: 1.0000 | MEDICATED_PATCH | CUTANEOUS | Status: DC

## 2016-03-14 NOTE — Telephone Encounter (Signed)
Routed to Dr. Shah °

## 2016-03-14 NOTE — Telephone Encounter (Signed)
Added the code 'chronic, continuous use of opioids' (304.01) the patient's problem list and attached to the prescription for Lidoderm patches

## 2016-03-14 NOTE — Telephone Encounter (Signed)
Patient was prescribed Lidocaine patches, her insurance denied them due to the ICD-10 code Dr. Manuella Ghazi attached to the Rx. She stated that she talked to her insurance and for Dr. Manuella Ghazi to change the ICD-10 code to what he used back in 2014 for her insurance to approve them. I looked back in allscripts and didn't see where she prescribed Lidocaine patches before. Please check her chart and see if you can find out what code she is talking about and have Dr. Manuella Ghazi use that code instead. Please call pt back with update. thanks

## 2016-03-21 ENCOUNTER — Telehealth: Payer: Self-pay

## 2016-03-21 NOTE — Telephone Encounter (Signed)
Patient has a question about the Lidocaine patches. They don't stay on her skin. The patches fall off after 20 mins. She has tried tape but breaks out in a rash. Please call significant other Jeneen Rinks. ( I was given verbal permission by pt to speak to Jeneen Rinks) [place] KitchenAppeal is in the work for her lidocaine patches her insurance has denied them.

## 2016-03-22 NOTE — Telephone Encounter (Signed)
Routed to Dr. Shah for advice  

## 2016-03-22 NOTE — Telephone Encounter (Signed)
Returned call and explained that the prescription for lidocaine patches was the same as that of 2014 and it may be that the pharmacy dispensed a different type of patch this time. She will try to contact the pharmacy and check for replacement.

## 2016-03-27 ENCOUNTER — Telehealth: Payer: Self-pay | Admitting: Family Medicine

## 2016-03-27 NOTE — Telephone Encounter (Signed)
Spoke with patients bf Tera Mater) and pt notified them that the paperwork was still on his desk despite Dr. Manuella Ghazi telling pt it would be done Monday morning. He now has signed the insurance appeal for her Lidocaine patches. I have faxed the appeal paperwork to her insurance company and received a confirmation that it went through. I apologized for this not being done.

## 2016-03-27 NOTE — Telephone Encounter (Signed)
PT IS ASKING THAT YOU ALL CALL HER ABOUT HER PATCHES. SHELLY SAID THAT YOU HAVE THE PAPER WORK ON YOUR DESK. PLEASE CALL PT.

## 2016-03-29 ENCOUNTER — Ambulatory Visit (INDEPENDENT_AMBULATORY_CARE_PROVIDER_SITE_OTHER): Payer: Medicare Other | Admitting: Family Medicine

## 2016-03-29 ENCOUNTER — Encounter: Payer: Self-pay | Admitting: Family Medicine

## 2016-03-29 VITALS — BP 109/71 | HR 86 | Temp 98.3°F | Resp 17 | Ht 61.0 in | Wt 115.7 lb

## 2016-03-29 DIAGNOSIS — G8929 Other chronic pain: Secondary | ICD-10-CM

## 2016-03-29 DIAGNOSIS — F419 Anxiety disorder, unspecified: Secondary | ICD-10-CM | POA: Diagnosis not present

## 2016-03-29 DIAGNOSIS — M542 Cervicalgia: Secondary | ICD-10-CM

## 2016-03-29 DIAGNOSIS — F4310 Post-traumatic stress disorder, unspecified: Secondary | ICD-10-CM | POA: Diagnosis not present

## 2016-03-29 MED ORDER — ALPRAZOLAM 0.5 MG PO TABS: 0.5000 mg | ORAL_TABLET | Freq: Two times a day (BID) | ORAL | Status: DC | PRN

## 2016-03-29 MED ORDER — OXYCODONE HCL 10 MG PO TABS: 10.0000 mg | ORAL_TABLET | Freq: Three times a day (TID) | ORAL | Status: DC | PRN
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
## 2016-04-10 ENCOUNTER — Ambulatory Visit: Payer: Medicare Other | Admitting: Licensed Clinical Social Worker

## 2016-04-15 DIAGNOSIS — D485 Neoplasm of uncertain behavior of skin: Secondary | ICD-10-CM | POA: Diagnosis not present

## 2016-04-15 DIAGNOSIS — L821 Other seborrheic keratosis: Secondary | ICD-10-CM | POA: Diagnosis not present

## 2016-04-15 DIAGNOSIS — D229 Melanocytic nevi, unspecified: Secondary | ICD-10-CM | POA: Diagnosis not present

## 2016-04-15 DIAGNOSIS — D225 Melanocytic nevi of trunk: Secondary | ICD-10-CM | POA: Diagnosis not present

## 2016-04-15 DIAGNOSIS — L400 Psoriasis vulgaris: Secondary | ICD-10-CM | POA: Diagnosis not present

## 2016-04-18 ENCOUNTER — Encounter: Payer: Self-pay | Admitting: Licensed Clinical Social Worker

## 2016-04-18 ENCOUNTER — Ambulatory Visit (INDEPENDENT_AMBULATORY_CARE_PROVIDER_SITE_OTHER): Payer: Medicare Other | Admitting: Licensed Clinical Social Worker

## 2016-04-18 DIAGNOSIS — F419 Anxiety disorder, unspecified: Secondary | ICD-10-CM
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
## 2016-04-24 ENCOUNTER — Encounter: Payer: Self-pay | Admitting: Family Medicine

## 2016-04-24 ENCOUNTER — Ambulatory Visit: Payer: Medicare Other | Admitting: Psychiatry

## 2016-04-24 ENCOUNTER — Ambulatory Visit (INDEPENDENT_AMBULATORY_CARE_PROVIDER_SITE_OTHER): Payer: Medicare Other | Admitting: Family Medicine

## 2016-04-24 VITALS — BP 110/68 | HR 78 | Temp 98.4°F | Resp 18 | Ht 61.0 in | Wt 116.1 lb

## 2016-04-24 DIAGNOSIS — F419 Anxiety disorder, unspecified: Secondary | ICD-10-CM

## 2016-04-24 DIAGNOSIS — M542 Cervicalgia: Secondary | ICD-10-CM

## 2016-04-24 DIAGNOSIS — G8929 Other chronic pain: Secondary | ICD-10-CM

## 2016-04-24 MED ORDER — OXYCODONE HCL 10 MG PO TABS: 10.0000 mg | ORAL_TABLET | Freq: Three times a day (TID) | ORAL | Status: DC | PRN

## 2016-04-24 MED ORDER — ALPRAZOLAM 0.5 MG PO TABS: 0.5000 mg | ORAL_TABLET | Freq: Two times a day (BID) | ORAL | Status: DC | PRN

## 2016-04-24 MED ORDER — CYCLOBENZAPRINE HCL 5 MG PO TABS: 5.0000 mg | ORAL_TABLET | Freq: Every day | ORAL | Status: DC

## 2016-04-24 NOTE — Progress Notes (Signed)
Name: Annette Arnold   MRN: [id number]    DOB: [birth year]   Date:04/24/2016       Progress Note  Subjective  Chief Complaint  Chief Complaint  Patient presents with  . Pain    1 month follow up for chronic neck pain    Anxiety Presents for follow-up visit. The problem has been unchanged. Symptoms include excessive worry, insomnia, nervous/anxious behavior, panic and shortness of breath. Patient reports no chest pain or dizziness.   Past treatments include benzodiazephines. The treatment provided significant relief.  Neck Pain  This is a chronic problem. The problem has been unchanged. The pain is present in the midline. The quality of the pain is described as burning. The pain is at a severity of 3/10. The symptoms are aggravated by position (turning her neck can bring on the pain.). Pertinent negatives include no chest pain, fever, paresis or tingling. She has tried oral narcotics and heat for the symptoms. The treatment provided moderate relief.    Past Medical History  Diagnosis Date  . Anxiety   . Chronic pain   . Hyperlipidemia     Past Surgical History  Procedure Laterality Date  . Breast lumpectomy    . Abdominal hysterectomy    . Carpal tunnel release    . Cholecystectomy    . Appendectomy      Family History  Problem Relation Age of Onset  . Cancer Mother   . Diabetes Mother   . Thyroid disease Mother   . Heart disease Father     Social History   Social History  . Marital Status: Married    Spouse Name: N/A  . Number of Children: N/A  . Years of Education: N/A   Occupational History  . Not on file.   Social History Main Topics  . Smoking status: Current Every Day Smoker -- 1.00 packs/day for 30 years    Types: Cigarettes  . Smokeless tobacco: Never Used  . Alcohol Use: No  . Drug Use: No  . Sexual Activity: Yes   Other Topics Concern  . Not on file   Social History Narrative     Current outpatient prescriptions:  .  ALPRAZolam (XANAX) 0.5  MG tablet, Take 1 tablet (0.5 mg total) by mouth 2 (two) times daily as needed for anxiety., Disp: 60 tablet, Rfl: 0 .  Apremilast (OTEZLA) 30 MG TABS, Take by mouth 2 (two) times daily., Disp: , Rfl:  .  Aspirin-Salicylamide-Caffeine (BC HEADACHE POWDER PO), Take by mouth., Disp: , Rfl:  .  clobetasol cream (TEMOVATE) 0.05 %, Apply topically 2 (two) times daily., Disp: 30 g, Rfl: 0 .  lubiprostone (AMITIZA) 24 MCG capsule, Take 1 capsule (24 mcg total) by mouth 2 (two) times daily with a meal. (Patient not taking: Reported on 04/18/2016), Disp: 60 capsule, Rfl: 0 .  Oxycodone HCl 10 MG TABS, Take 1 tablet (10 mg total) by mouth 3 (three) times daily as needed., Disp: 90 tablet, Rfl: 0 .  pravastatin (PRAVACHOL) 40 MG tablet, Take 1 tablet (40 mg total) by mouth daily., Disp: 90 tablet, Rfl: 0 .  tiZANidine (ZANAFLEX) 4 MG tablet, Take 1 tablet (4 mg total) by mouth every 8 (eight) hours as needed for muscle spasms. (Patient not taking: Reported on 04/18/2016), Disp: 90 tablet, Rfl: 0  Allergies  Allergen Reactions  . Codeine Nausea And Vomiting    Review of Systems  Constitutional: Negative for fever.  Respiratory: Positive for shortness of breath.   Cardiovascular:  Negative for chest pain.  Musculoskeletal: Positive for neck pain.  Neurological: Negative for dizziness and tingling.  Psychiatric/Behavioral: The patient is nervous/anxious and has insomnia.     Objective  Filed Vitals:   04/24/16 1051  BP: 110/68  Pulse: 78  Temp: 98.4 F (36.9 C)  Resp: 18  Height: 5\' 1"  (1.549 m)  Weight: 116 lb 2 oz (52.674 kg)  SpO2: 96%    Physical Exam  Constitutional: She is oriented to person, place, and time and well-developed, well-nourished, and in no distress.  Cardiovascular: Normal rate and regular rhythm.   Pulmonary/Chest: Effort normal and breath sounds normal.  Musculoskeletal:       Cervical back: She exhibits tenderness, pain and spasm.       Lumbar back: She exhibits  tenderness, pain and spasm.  Neurological: She is alert and oriented to person, place, and time.  Nursing note and vitals reviewed.     Assessment & Plan  1. Anxiety Stable and responsive to alprazolam taken twice daily as needed. Compliant with controlled substances agreement, aware of the dependence potential, side effects, and drug interactions associated with benzodiazepines. Refills provided. - ALPRAZolam (XANAX) 0.5 MG tablet; Take 1 tablet (0.5 mg total) by mouth 2 (two) times daily as needed for anxiety.  Dispense: 60 tablet; Refill: 0  2. Chronic neck pain DC tizanidine and restart on cyclobenzaprine for muscle spasm associated with chronic neck and upper back pain. Continue on opioid therapy. - Oxycodone HCl 10 MG TABS; Take 1 tablet (10 mg total) by mouth 3 (three) times daily as needed.  Dispense: 90 tablet; Refill: 0 - cyclobenzaprine (FLEXERIL) 5 MG tablet; Take 1 tablet (5 mg total) by mouth at bedtime.  Dispense: 30 tablet; Refill: 0   [name] [facility] 04/24/2016 11:19 AM

## 2016-05-02 ENCOUNTER — Ambulatory Visit: Payer: Medicare Other | Admitting: Licensed Clinical Social Worker

## 2016-05-16 ENCOUNTER — Ambulatory Visit (INDEPENDENT_AMBULATORY_CARE_PROVIDER_SITE_OTHER): Payer: Medicare Other | Admitting: Psychiatry

## 2016-05-16 ENCOUNTER — Encounter: Payer: Self-pay | Admitting: Psychiatry

## 2016-05-16 VITALS — BP 122/86 | HR 78 | Temp 98.2°F | Ht 61.0 in | Wt 117.2 lb

## 2016-05-16 DIAGNOSIS — F331 Major depressive disorder, recurrent, moderate: Secondary | ICD-10-CM | POA: Diagnosis not present

## 2016-05-16 DIAGNOSIS — F4312 Post-traumatic stress disorder, chronic: Secondary | ICD-10-CM

## 2016-05-16 MED ORDER — PRAZOSIN HCL 2 MG PO CAPS: 2.0000 mg | ORAL_CAPSULE | Freq: Every day | ORAL | Status: DC
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
## 2016-05-21 ENCOUNTER — Ambulatory Visit: Payer: Self-pay | Admitting: Licensed Clinical Social Worker

## 2016-05-24 ENCOUNTER — Ambulatory Visit (INDEPENDENT_AMBULATORY_CARE_PROVIDER_SITE_OTHER): Payer: Medicare Other | Admitting: Family Medicine

## 2016-05-24 ENCOUNTER — Encounter: Payer: Self-pay | Admitting: Family Medicine

## 2016-05-24 VITALS — BP 112/68 | HR 82 | Temp 98.4°F | Resp 16 | Ht 61.0 in | Wt 115.4 lb

## 2016-05-24 DIAGNOSIS — M542 Cervicalgia: Secondary | ICD-10-CM

## 2016-05-24 DIAGNOSIS — G8929 Other chronic pain: Secondary | ICD-10-CM

## 2016-05-24 DIAGNOSIS — F419 Anxiety disorder, unspecified: Secondary | ICD-10-CM

## 2016-05-24 MED ORDER — ALPRAZOLAM 0.5 MG PO TABS: 0.5000 mg | ORAL_TABLET | Freq: Two times a day (BID) | ORAL | Status: DC | PRN

## 2016-05-24 MED ORDER — OXYCODONE HCL 10 MG PO TABS: 10.0000 mg | ORAL_TABLET | Freq: Three times a day (TID) | ORAL | Status: DC | PRN

## 2016-05-24 NOTE — Progress Notes (Signed)
Name: Annette Arnold   MRN: [id number]    DOB: [birth year]   Date:05/24/2016       Progress Note  Subjective  Chief Complaint  Chief Complaint  Patient presents with  . Follow-up    1 mo  . Medication Refill    HPI  Anxiety: Pt. Presents for medication refill on Alprazolam 0.5 mg three times daily as needed. Symptoms of anxiety include nervousness, worried about multiple situations. She takes Alprazolam 0.5 mg three times daily a sneeded, which helps relieve her anxiety. She has been seeing Psychiatry as well and recently started on a new medication.   Neck Pain: Pt. Presents for medication refill and follow up of chronic neck pain. Pain is rated at 3/10, sometimes radiates down into the elbows. She takes Oxycodone 10 mg three times daily as needed.    Past Medical History  Diagnosis Date  . Anxiety   . Chronic pain   . Hyperlipidemia     Past Surgical History  Procedure Laterality Date  . Breast lumpectomy    . Abdominal hysterectomy    . Carpal tunnel release    . Cholecystectomy    . Appendectomy      Family History  Problem Relation Age of Onset  . Cancer Mother   . Diabetes Mother   . Thyroid disease Mother   . Heart disease Father   . Alcohol abuse Father   . Drug abuse Sister   . Paranoid behavior Sister     Social History   Social History  . Marital Status: Married    Spouse Name: N/A  . Number of Children: N/A  . Years of Education: N/A   Occupational History  . Not on file.   Social History Main Topics  . Smoking status: Current Every Day Smoker -- 1.00 packs/day for 30 years    Types: Cigarettes  . Smokeless tobacco: Never Used  . Alcohol Use: No  . Drug Use: No  . Sexual Activity: Yes   Other Topics Concern  . Not on file   Social History Narrative     Current outpatient prescriptions:  .  ALPRAZolam (XANAX) 0.5 MG tablet, Take 1 tablet (0.5 mg total) by mouth 2 (two) times daily as needed for anxiety., Disp: 60 tablet, Rfl: 0 .   Apremilast (OTEZLA) 30 MG TABS, Take by mouth 2 (two) times daily., Disp: , Rfl:  .  Aspirin-Salicylamide-Caffeine (BC HEADACHE POWDER PO), Take by mouth., Disp: , Rfl:  .  calcipotriene (DOVONOX) 0.005 % cream, apply to affected area once daily to twice a day if needed for ps...  (REFER TO PRESCRIPTION NOTES)., Disp: , Rfl: 0 .  clobetasol cream (TEMOVATE) 0.05 %, Apply topically 2 (two) times daily., Disp: 30 g, Rfl: 0 .  cyclobenzaprine (FLEXERIL) 5 MG tablet, Take 1 tablet (5 mg total) by mouth at bedtime., Disp: 30 tablet, Rfl: 0 .  lidocaine (LIDODERM) 5 %, , Disp: , Rfl: 0 .  lubiprostone (AMITIZA) 24 MCG capsule, Take 1 capsule (24 mcg total) by mouth 2 (two) times daily with a meal., Disp: 60 capsule, Rfl: 0 .  Oxycodone HCl 10 MG TABS, Take 1 tablet (10 mg total) by mouth 3 (three) times daily as needed., Disp: 90 tablet, Rfl: 0 .  pravastatin (PRAVACHOL) 40 MG tablet, Take 1 tablet (40 mg total) by mouth daily., Disp: 90 tablet, Rfl: 0 .  prazosin (MINIPRESS) 2 MG capsule, Take 1 capsule (2 mg total) by mouth at bedtime., Disp: 30  capsule, Rfl: 0  Allergies  Allergen Reactions  . Codeine Nausea And Vomiting     Review of Systems  Constitutional: Negative for fever, chills and malaise/fatigue.  Musculoskeletal: Positive for back pain and neck pain.  Psychiatric/Behavioral: Positive for depression. The patient is nervous/anxious.     Objective  Filed Vitals:   05/24/16 1029  BP: 112/68  Pulse: 82  Temp: 98.4 F (36.9 C)  TempSrc: Oral  Resp: 16  Height: 5\' 1"  (1.549 m)  Weight: 115 lb 6.4 oz (52.345 kg)  SpO2: 96%    Physical Exam  Constitutional: She is oriented to person, place, and time and well-developed, well-nourished, and in no distress.  Cardiovascular: Normal rate and regular rhythm.   Pulmonary/Chest: Effort normal and breath sounds normal.  Musculoskeletal:       Cervical back: She exhibits tenderness, pain and spasm.  Neurological: She is alert and  oriented to person, place, and time.  Psychiatric: Memory, affect and judgment normal.  Nursing note and vitals reviewed.     Assessment & Plan  1. Anxiety Stable and responsive to alprazolam taken twice daily as needed. Refills provided - ALPRAZolam (XANAX) 0.5 MG tablet; Take 1 tablet (0.5 mg total) by mouth 2 (two) times daily as needed for anxiety.  Dispense: 60 tablet; Refill: 0  2. Chronic neck pain  - Oxycodone HCl 10 MG TABS; Take 1 tablet (10 mg total) by mouth 3 (three) times daily as needed.  Dispense: 90 tablet; Refill: 0   Urban Naval [facility] 05/24/2016 10:39 AM

## 2016-05-29 ENCOUNTER — Telehealth: Payer: Self-pay | Admitting: Family Medicine

## 2016-05-29 NOTE — Telephone Encounter (Signed)
Voltaren gel may be an option, it is usually given for arthritis. We will have to discuss at patient's office visit

## 2016-05-29 NOTE — Telephone Encounter (Signed)
Pt called stating pharmacy recommends her to take Voltaren Gel. Her insurance may cover this. Please advise.

## 2016-05-29 NOTE — Telephone Encounter (Signed)
LVM for pt to call office

## 2016-05-31 ENCOUNTER — Ambulatory Visit: Payer: Medicare Other | Admitting: Psychiatry

## 2016-06-19 ENCOUNTER — Encounter: Payer: Self-pay | Admitting: Family Medicine

## 2016-06-19 ENCOUNTER — Other Ambulatory Visit: Payer: Self-pay

## 2016-06-19 ENCOUNTER — Ambulatory Visit (INDEPENDENT_AMBULATORY_CARE_PROVIDER_SITE_OTHER): Payer: Medicare Other | Admitting: Family Medicine

## 2016-06-19 VITALS — BP 124/70 | HR 97 | Temp 98.4°F | Resp 16 | Ht 61.0 in | Wt 115.8 lb

## 2016-06-19 DIAGNOSIS — G8929 Other chronic pain: Secondary | ICD-10-CM

## 2016-06-19 DIAGNOSIS — M545 Low back pain, unspecified: Secondary | ICD-10-CM

## 2016-06-19 DIAGNOSIS — M546 Pain in thoracic spine: Secondary | ICD-10-CM | POA: Diagnosis not present

## 2016-06-19 DIAGNOSIS — M549 Dorsalgia, unspecified: Secondary | ICD-10-CM

## 2016-06-19 DIAGNOSIS — F419 Anxiety disorder, unspecified: Secondary | ICD-10-CM

## 2016-06-19 DIAGNOSIS — M25551 Pain in right hip: Secondary | ICD-10-CM | POA: Insufficient documentation

## 2016-06-19 MED ORDER — DICLOFENAC SODIUM 1 % TD GEL: 2.0000 g | Freq: Four times a day (QID) | TRANSDERMAL | Status: DC

## 2016-06-19 MED ORDER — ALPRAZOLAM 0.5 MG PO TABS: 0.5000 mg | ORAL_TABLET | Freq: Two times a day (BID) | ORAL | Status: DC | PRN

## 2016-06-19 MED ORDER — OXYCODONE HCL 10 MG PO TABS: 10.0000 mg | ORAL_TABLET | Freq: Three times a day (TID) | ORAL | Status: DC | PRN

## 2016-06-19 NOTE — Progress Notes (Signed)
Name: Annette Arnold   MRN: [id number]    DOB: [birth year]   Date:06/19/2016       Progress Note  Subjective  Chief Complaint  Chief Complaint  Patient presents with  . Anxiety     medication refills  . Neck Pain    Anxiety Presents for follow-up visit. The problem has been unchanged. Symptoms include chest pain (occasional intermittent but recurrent chest pain) and insomnia. Patient reports no excessive worry, irritability, nervous/anxious behavior, panic or shortness of breath.   Past treatments include benzodiazephines. The treatment provided significant relief. Compliance with prior treatments has been good.  Neck Pain  This is a chronic problem. The problem has been gradually worsening. The pain is present in the right side. The pain is at a severity of 8/10. The pain is severe. Nothing aggravates the symptoms. Associated symptoms include chest pain (occasional intermittent but recurrent chest pain). She has tried oral narcotics for the symptoms. The treatment provided significant relief.  Back Pain This is a chronic problem. The problem is unchanged. The pain is present in the lumbar spine. The quality of the pain is described as stabbing. The pain radiates to the left thigh. The pain is at a severity of 4/10. The symptoms are aggravated by standing and bending. Associated symptoms include chest pain (occasional intermittent but recurrent chest pain). She has tried analgesics (Pt. has discussed with Pharmacist and believes VOltaren Gel may be an option for her back pain, wants to try this.) for the symptoms.    Past Medical History  Diagnosis Date  . Anxiety   . Chronic pain   . Hyperlipidemia     Past Surgical History  Procedure Laterality Date  . Breast lumpectomy    . Abdominal hysterectomy    . Carpal tunnel release    . Cholecystectomy    . Appendectomy      Family History  Problem Relation Age of Onset  . Cancer Mother   . Diabetes Mother   . Thyroid disease  Mother   . Heart disease Father   . Alcohol abuse Father   . Drug abuse Sister   . Paranoid behavior Sister     Social History   Social History  . Marital Status: Married    Spouse Name: N/A  . Number of Children: N/A  . Years of Education: N/A   Occupational History  . Not on file.   Social History Main Topics  . Smoking status: Current Every Day Smoker -- 1.00 packs/day for 30 years    Types: Cigarettes  . Smokeless tobacco: Never Used  . Alcohol Use: No  . Drug Use: No  . Sexual Activity: Yes   Other Topics Concern  . Not on file   Social History Narrative     Current outpatient prescriptions:  .  ALPRAZolam (XANAX) 0.5 MG tablet, Take 1 tablet (0.5 mg total) by mouth 2 (two) times daily as needed for anxiety., Disp: 60 tablet, Rfl: 0 .  Apremilast (OTEZLA) 30 MG TABS, Take by mouth 2 (two) times daily., Disp: , Rfl:  .  Aspirin-Salicylamide-Caffeine (BC HEADACHE POWDER PO), Take by mouth., Disp: , Rfl:  .  calcipotriene (DOVONOX) 0.005 % cream, apply to affected area once daily to twice a day if needed for ps...  (REFER TO PRESCRIPTION NOTES)., Disp: , Rfl: 0 .  clobetasol cream (TEMOVATE) 0.05 %, Apply topically 2 (two) times daily., Disp: 30 g, Rfl: 0 .  cyclobenzaprine (FLEXERIL) 5 MG tablet, Take 1 tablet (  5 mg total) by mouth at bedtime., Disp: 30 tablet, Rfl: 0 .  lidocaine (LIDODERM) 5 %, , Disp: , Rfl: 0 .  lubiprostone (AMITIZA) 24 MCG capsule, Take 1 capsule (24 mcg total) by mouth 2 (two) times daily with a meal., Disp: 60 capsule, Rfl: 0 .  Oxycodone HCl 10 MG TABS, Take 1 tablet (10 mg total) by mouth 3 (three) times daily as needed., Disp: 90 tablet, Rfl: 0 .  pravastatin (PRAVACHOL) 40 MG tablet, Take 1 tablet (40 mg total) by mouth daily., Disp: 90 tablet, Rfl: 0 .  prazosin (MINIPRESS) 2 MG capsule, Take 1 capsule (2 mg total) by mouth at bedtime., Disp: 30 capsule, Rfl: 0  Allergies  Allergen Reactions  . Codeine Nausea And Vomiting   Review of  Systems  Constitutional: Negative for irritability.  Respiratory: Negative for shortness of breath.   Cardiovascular: Positive for chest pain (occasional intermittent but recurrent chest pain).  Musculoskeletal: Positive for back pain and neck pain.  Psychiatric/Behavioral: The patient has insomnia. The patient is not nervous/anxious.     Objective  Filed Vitals:   06/19/16 1352  BP: 124/70  Pulse: 97  Temp: 98.4 F (36.9 C)  TempSrc: Oral  Resp: 16  Height: 5\' 1"  (1.549 m)  Weight: 115 lb 12.8 oz (52.527 kg)  SpO2: 96%    Physical Exam  Constitutional: She is oriented to person, place, and time and well-developed, well-nourished, and in no distress.  Cardiovascular: Normal rate, regular rhythm, S1 normal and S2 normal.   Pulmonary/Chest: Effort normal. No respiratory distress. She has no decreased breath sounds. She has no wheezes. She has no rhonchi.  Musculoskeletal:       Right hip: She exhibits tenderness.       Cervical back: She exhibits tenderness, pain and spasm.       Lumbar back: She exhibits tenderness and pain.       Back:  Palpable tenderness along the right medial scapular spine with associated muscle spasm  Neurological: She is alert and oriented to person, place, and time.  Psychiatric: Mood, memory, affect and judgment normal. Her mood appears not anxious.  Nursing note and vitals reviewed.      Assessment & Plan  1. Chronic upper back pain Pain responsive to oxycodone taken up to 3 times daily as needed. Refills provided - Oxycodone HCl 10 MG TABS; Take 1 tablet (10 mg total) by mouth 3 (three) times daily as needed.  Dispense: 90 tablet; Refill: 0  2. Chronic low back pain  - Oxycodone HCl 10 MG TABS; Take 1 tablet (10 mg total) by mouth 3 (three) times daily as needed.  Dispense: 90 tablet; Refill: 0  3. Anxiety Symptoms stable on alprazolam taken twice daily as needed. Refills provided - ALPRAZolam (XANAX) 0.5 MG tablet; Take 1 tablet (0.5  mg total) by mouth 2 (two) times daily as needed for anxiety.  Dispense: 60 tablet; Refill: 0  4. Right hip pain Explained that Voltaren gel is mainly for osteoarthritis of joints of upper or lower extremity such as elbow, shoulder, and knees. Patient wishes to try Voltaren gel on her lower back and right hip. Prescription provided and will reassess in one month. - diclofenac sodium (VOLTAREN) 1 % GEL; Apply 2 g topically 4 (four) times daily.  Dispense: 1 Tube; Refill: 0   Timon Geissinger [facility] 06/19/2016 2:28 PM

## 2016-06-20 ENCOUNTER — Telehealth: Payer: Self-pay | Admitting: Family Medicine

## 2016-06-20 ENCOUNTER — Ambulatory Visit
Admission: RE | Admit: 2016-06-20 | Discharge: 2016-06-20 | Disposition: A | Discharge status: Home / Self Care | Payer: Medicare Other | Source: Ambulatory Visit | Attending: Family Medicine | Admitting: Family Medicine

## 2016-06-20 DIAGNOSIS — G8929 Other chronic pain: Secondary | ICD-10-CM | POA: Insufficient documentation

## 2016-06-20 DIAGNOSIS — M25511 Pain in right shoulder: Secondary | ICD-10-CM | POA: Diagnosis not present

## 2016-06-20 DIAGNOSIS — M549 Dorsalgia, unspecified: Principal | ICD-10-CM

## 2016-06-20 DIAGNOSIS — M546 Pain in thoracic spine: Secondary | ICD-10-CM | POA: Insufficient documentation

## 2016-06-20 DIAGNOSIS — M542 Cervicalgia: Secondary | ICD-10-CM | POA: Diagnosis not present

## 2016-06-20 NOTE — Telephone Encounter (Signed)
Pt states she was in yesterday and during her visit there was a conversation about getting a X RAY on her right shoulder area where her pain is at. Pt states after Dr Manuella Ghazi mashed that area yesterday she has been in a lot of pain. Pt was crying while telling me about her pain and wants to know if she can get that X ray today. Please advise.

## 2016-06-20 NOTE — Telephone Encounter (Signed)
X-ray of cervical spine and right shoulder is ordered.

## 2016-06-20 NOTE — Telephone Encounter (Signed)
Pt.notified

## 2016-06-25 ENCOUNTER — Ambulatory Visit: Payer: Self-pay | Admitting: Family Medicine

## 2016-07-18 ENCOUNTER — Encounter: Payer: Self-pay | Admitting: Family Medicine

## 2016-07-18 ENCOUNTER — Ambulatory Visit (INDEPENDENT_AMBULATORY_CARE_PROVIDER_SITE_OTHER): Payer: Medicare Other | Admitting: Family Medicine

## 2016-07-18 VITALS — BP 112/68 | HR 78 | Temp 98.6°F | Resp 16 | Ht 61.0 in | Wt 117.6 lb

## 2016-07-18 DIAGNOSIS — F419 Anxiety disorder, unspecified: Secondary | ICD-10-CM

## 2016-07-18 DIAGNOSIS — M545 Low back pain: Secondary | ICD-10-CM | POA: Diagnosis not present

## 2016-07-18 DIAGNOSIS — M546 Pain in thoracic spine: Secondary | ICD-10-CM | POA: Diagnosis not present

## 2016-07-18 DIAGNOSIS — G8929 Other chronic pain: Secondary | ICD-10-CM | POA: Diagnosis not present

## 2016-07-18 DIAGNOSIS — M549 Dorsalgia, unspecified: Principal | ICD-10-CM

## 2016-07-18 MED ORDER — OXYCODONE HCL 10 MG PO TABS: 10.0000 mg | ORAL_TABLET | Freq: Three times a day (TID) | ORAL | Status: DC | PRN

## 2016-07-18 MED ORDER — ALPRAZOLAM 0.5 MG PO TABS: 0.5000 mg | ORAL_TABLET | Freq: Two times a day (BID) | ORAL | Status: DC | PRN

## 2016-07-18 NOTE — Progress Notes (Signed)
Name: Annette Arnold   MRN: [id number]    DOB: April 27, 1961   Date:07/18/2016       Progress Note  Subjective  Chief Complaint  Chief Complaint  Patient presents with  . Back Pain    chronic pain med refill  . Anxiety    Back Pain This is a chronic problem. The problem is unchanged. The pain is present in the lumbar spine. The pain radiates to the left thigh and right thigh. The pain is at a severity of 8/10. The symptoms are aggravated by sitting (prolonged sitting makes it worse.). Associated symptoms include headaches. Pertinent negatives include no bladder incontinence or bowel incontinence.  Anxiety Presents for follow-up visit. The problem has been gradually improving. Symptoms include excessive worry, muscle tension, nervous/anxious behavior and panic. The severity of symptoms is moderate.   Her past medical history is significant for anxiety/panic attacks.  Neck Pain  This is a chronic problem. The problem has been unchanged. The pain is present in the midline. The pain is at a severity of 6/10. The symptoms are aggravated by position (sleeping wrong, sitting up too long can make it worse.). Associated symptoms include headaches. She has tried oral narcotics for the symptoms. The treatment provided significant relief.    Past Medical History  Diagnosis Date  . Anxiety   . Chronic pain   . Hyperlipidemia     Past Surgical History  Procedure Laterality Date  . Breast lumpectomy    . Abdominal hysterectomy    . Carpal tunnel release    . Cholecystectomy    . Appendectomy      Family History  Problem Relation Age of Onset  . Cancer Mother   . Diabetes Mother   . Thyroid disease Mother   . Heart disease Father   . Alcohol abuse Father   . Drug abuse Sister   . Paranoid behavior Sister     Social History   Social History  . Marital Status: Married    Spouse Name: N/A  . Number of Children: N/A  . Years of Education: N/A   Occupational History  . Not on file.     Social History Main Topics  . Smoking status: Current Every Day Smoker -- 1.00 packs/day for 30 years    Types: Cigarettes  . Smokeless tobacco: Never Used  . Alcohol Use: No  . Drug Use: No  . Sexual Activity: Yes   Other Topics Concern  . Not on file   Social History Narrative     Current outpatient prescriptions:  .  ALPRAZolam (XANAX) 0.5 MG tablet, Take 1 tablet (0.5 mg total) by mouth 2 (two) times daily as needed for anxiety., Disp: 60 tablet, Rfl: 0 .  Apremilast (OTEZLA) 30 MG TABS, Take by mouth 2 (two) times daily., Disp: , Rfl:  .  calcipotriene (DOVONOX) 0.005 % cream, apply to affected area once daily to twice a day if needed for ps...  (REFER TO PRESCRIPTION NOTES)., Disp: , Rfl: 0 .  clobetasol cream (TEMOVATE) 0.05 %, Apply topically 2 (two) times daily., Disp: 30 g, Rfl: 0 .  cyclobenzaprine (FLEXERIL) 5 MG tablet, Take 1 tablet (5 mg total) by mouth at bedtime., Disp: 30 tablet, Rfl: 0 .  diclofenac sodium (VOLTAREN) 1 % GEL, Apply 2 g topically 4 (four) times daily., Disp: 1 Tube, Rfl: 0 .  lidocaine (LIDODERM) 5 %, , Disp: , Rfl: 0 .  Oxycodone HCl 10 MG TABS, Take 1 tablet (10 mg total)  by mouth 3 (three) times daily as needed., Disp: 90 tablet, Rfl: 0 .  pravastatin (PRAVACHOL) 40 MG tablet, Take 1 tablet (40 mg total) by mouth daily., Disp: 90 tablet, Rfl: 0 .  prazosin (MINIPRESS) 2 MG capsule, Take 1 capsule (2 mg total) by mouth at bedtime., Disp: 30 capsule, Rfl: 0 .  Aspirin-Salicylamide-Caffeine (BC HEADACHE POWDER PO), Take by mouth. Reported on 07/18/2016, Disp: , Rfl:  .  lubiprostone (AMITIZA) 24 MCG capsule, Take 1 capsule (24 mcg total) by mouth 2 (two) times daily with a meal. (Patient not taking: Reported on 07/18/2016), Disp: 60 capsule, Rfl: 0  Allergies  Allergen Reactions  . Codeine Nausea And Vomiting    Review of Systems  Gastrointestinal: Negative for bowel incontinence.  Genitourinary: Negative for bladder incontinence.   Musculoskeletal: Positive for back pain and neck pain.  Neurological: Positive for headaches.  Psychiatric/Behavioral: The patient is nervous/anxious.     Objective  Filed Vitals:   07/18/16 1421  BP: 112/68  Pulse: 78  Temp: 98.6 F (37 C)  TempSrc: Oral  Resp: 16  Height: 5\' 1"  (1.549 m)  Weight: 117 lb 9.6 oz (53.343 kg)  SpO2: 98%    Physical Exam  Constitutional: She is oriented to person, place, and time and well-developed, well-nourished, and in no distress.  HENT:  Head: Normocephalic and atraumatic.  Cardiovascular: Normal rate, regular rhythm and normal heart sounds.   No murmur heard. Pulmonary/Chest: Effort normal and breath sounds normal. She has no wheezes.  Abdominal: Soft. Bowel sounds are normal.  Musculoskeletal:       Cervical back: She exhibits tenderness, pain and spasm.       Back:       Right upper leg: She exhibits no swelling and no edema.       Left upper leg: She exhibits no swelling and no edema.  Palpation of upper and lower legs reveal some tenderness associated with muscle spasm.  Neurological: She is alert and oriented to person, place, and time.  Psychiatric: Mood, memory, affect and judgment normal.  Nursing note and vitals reviewed.    Assessment & Plan  1. Chronic upper back pain Stable, responsive to opioid therapy. - Oxycodone HCl 10 MG TABS; Take 1 tablet (10 mg total) by mouth 3 (three) times daily as needed.  Dispense: 90 tablet; Refill: 0  2. Chronic low back pain Lower back pain responsive to opioid therapy, patient requesting increase in oxycodone from 3 times daily to 4 times daily, explained that I'm not comfortable increasing her dosage at this time. She is also applying Voltaren gel which helps only marginally. Discussed changing the muscle relaxer but patient's insurance denied Skelaxin in the past. For now, continue on present management and follow up in one month - Oxycodone HCl 10 MG TABS; Take 1 tablet (10 mg  total) by mouth 3 (three) times daily as needed.  Dispense: 90 tablet; Refill: 0  3. Anxiety Stable and improved, aware of the dependence potential, drug interactions, and side effects of alprazolam. Refills provided and follow-up in one month - ALPRAZolam (XANAX) 0.5 MG tablet; Take 1 tablet (0.5 mg total) by mouth 2 (two) times daily as needed for anxiety.  Dispense: 60 tablet; Refill: 0   Brittain Hosie [facility] 07/18/2016 3:04 PM

## 2016-08-13 IMAGING — CR DG KNEE 1-2V*L*
1 series · 2 of 2 positions shown · non-contrast
Comparison: 09/08/2013

CLINICAL DATA: Left knee and distal femur swelling starting 30
minutes ago. Sudden onset. Redness and swelling to the distal femur
above the patella.

EXAM:
LEFT KNEE - 1-2 VIEW

[Series 1: dxr knee left ap and lateral · 0.14mm/px · 2 of 2 slices shown]
[im 1/2]
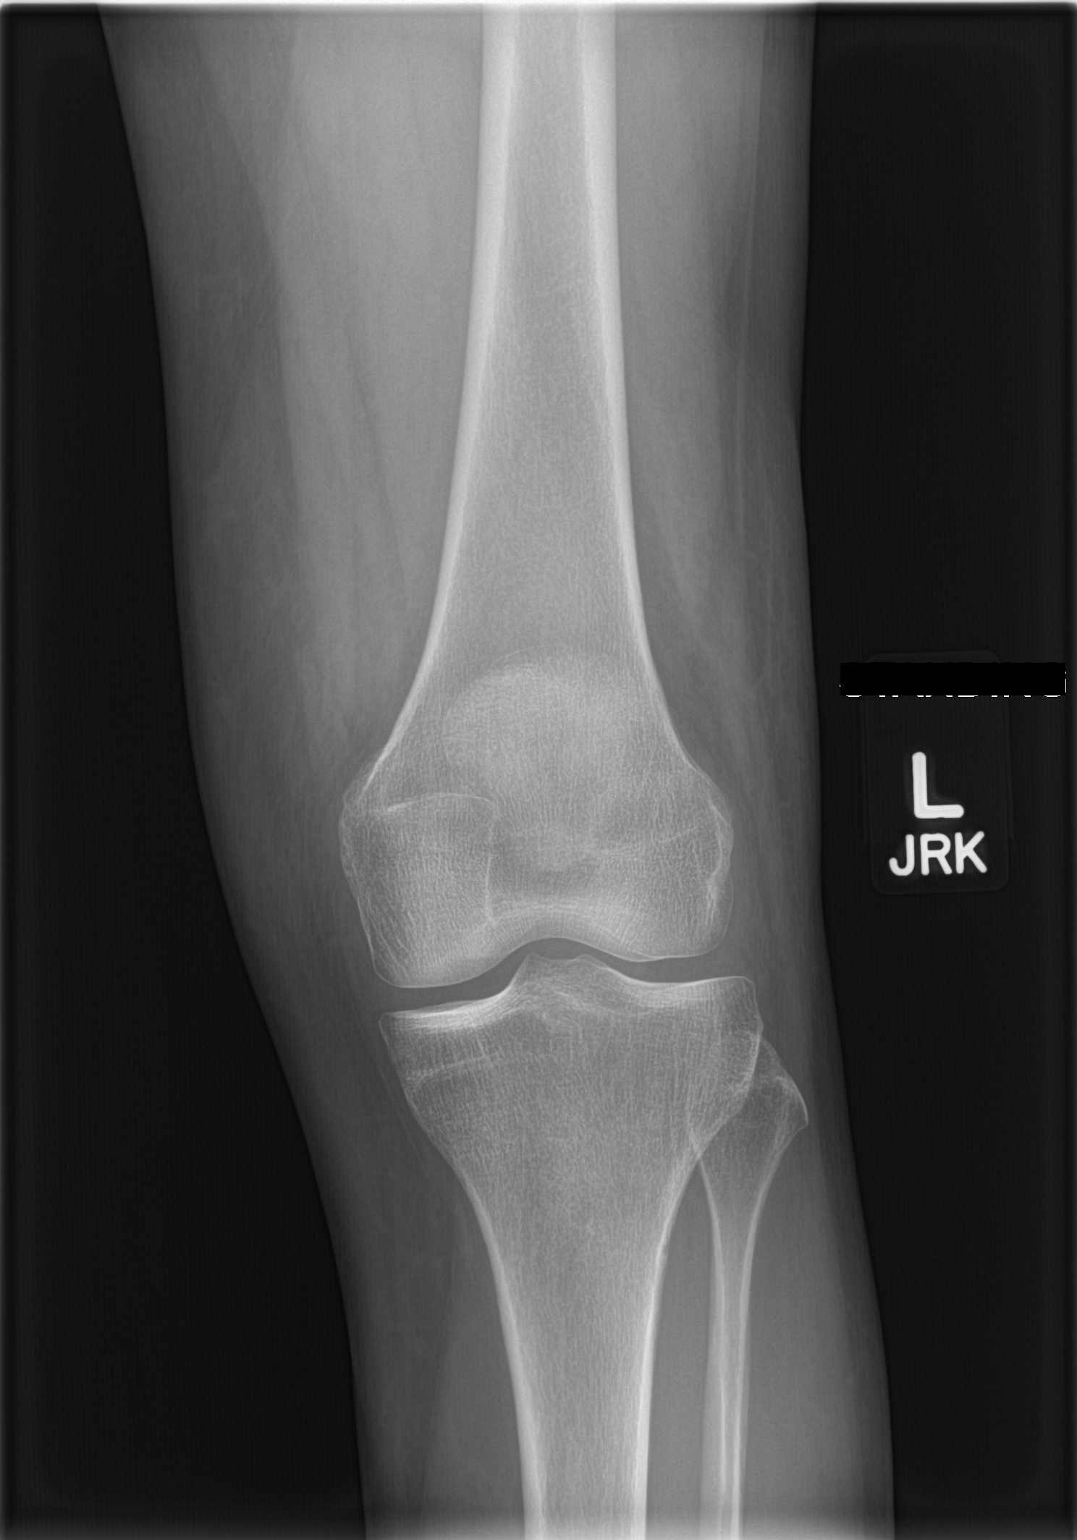
[im 2/2]
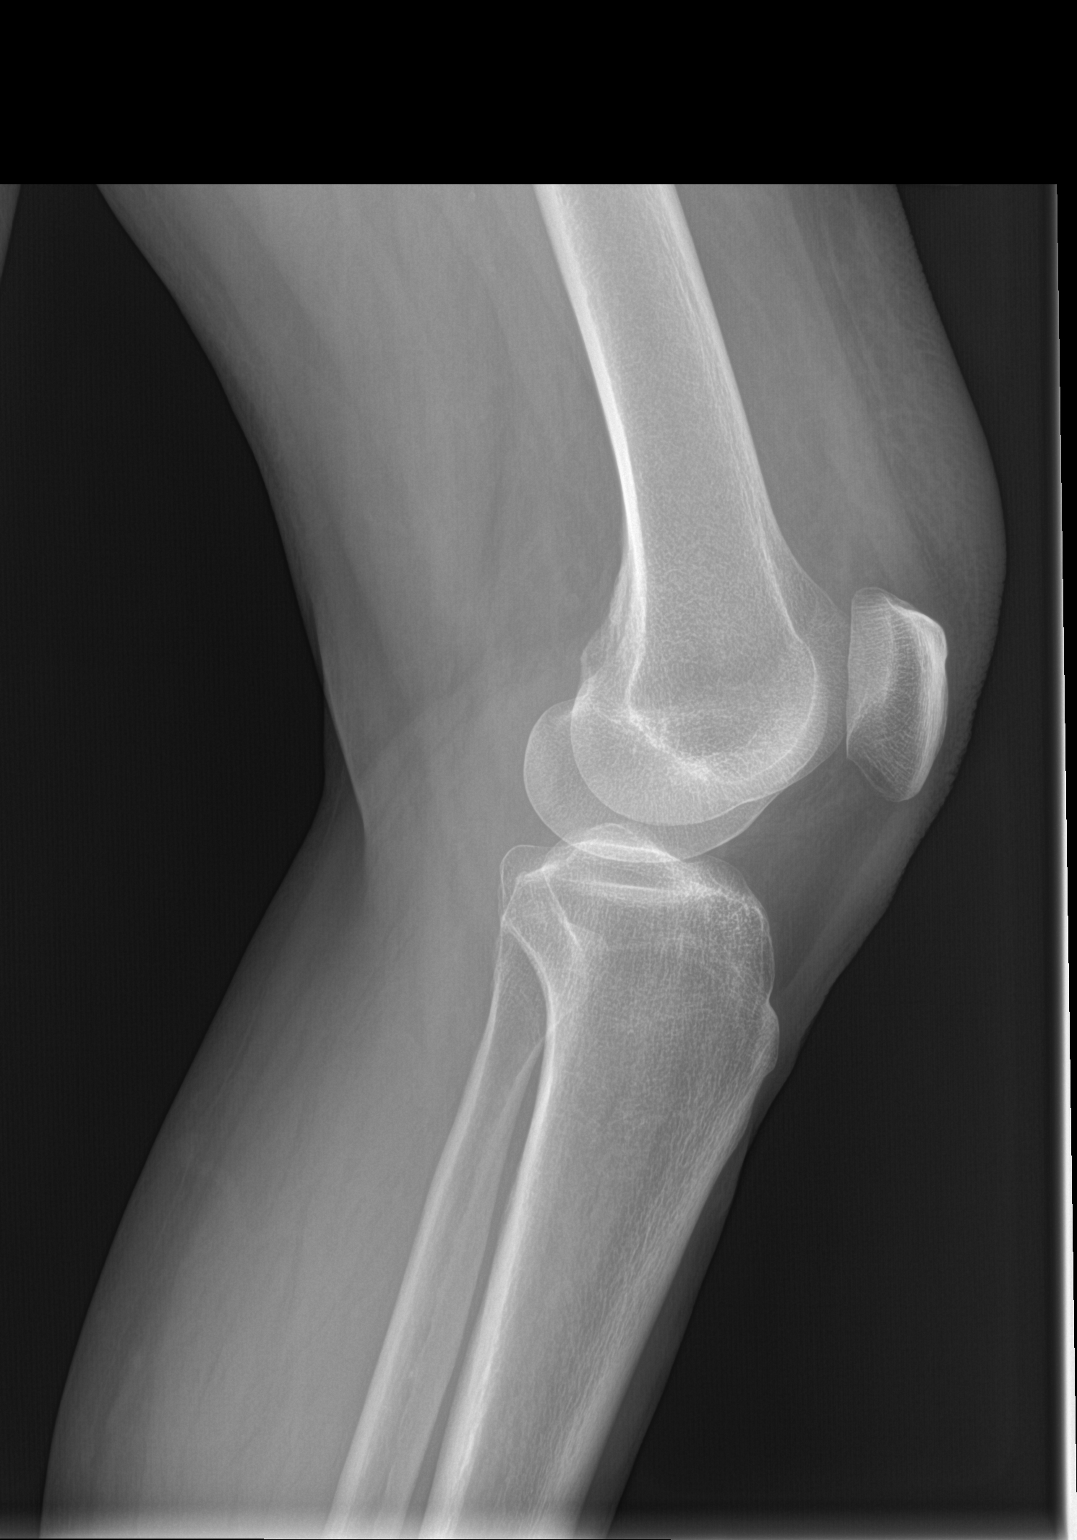

[2 of 2 positions shown; findings below may reference images not displayed]

FINDINGS: Mild swelling and infiltration in the subcutaneous fat over the
suprapatellar region. This may indicate edema or cellulitis. No
significant effusion. No evidence of acute fracture or dislocation
in the left knee. No focal bone lesion or bone destruction. Cortical
surfaces appear intact.
IMPRESSION: Soft tissue infiltration in the subcutaneous fat over the
suprapatellar region. No acute bony abnormalities.

## 2016-08-19 ENCOUNTER — Ambulatory Visit (INDEPENDENT_AMBULATORY_CARE_PROVIDER_SITE_OTHER): Payer: Medicare Other | Admitting: Family Medicine

## 2016-08-19 ENCOUNTER — Encounter: Payer: Self-pay | Admitting: Family Medicine

## 2016-08-19 DIAGNOSIS — M546 Pain in thoracic spine: Secondary | ICD-10-CM | POA: Diagnosis not present

## 2016-08-19 DIAGNOSIS — F419 Anxiety disorder, unspecified: Secondary | ICD-10-CM | POA: Diagnosis not present

## 2016-08-19 DIAGNOSIS — M549 Dorsalgia, unspecified: Secondary | ICD-10-CM

## 2016-08-19 DIAGNOSIS — G8929 Other chronic pain: Secondary | ICD-10-CM | POA: Diagnosis not present

## 2016-08-19 DIAGNOSIS — M545 Low back pain: Secondary | ICD-10-CM

## 2016-08-19 MED ORDER — ALPRAZOLAM 0.5 MG PO TABS: 0.5000 mg | ORAL_TABLET | Freq: Two times a day (BID) | ORAL | 0 refills | Status: DC | PRN

## 2016-08-19 MED ORDER — OXYCODONE HCL 10 MG PO TABS: 10.0000 mg | ORAL_TABLET | Freq: Three times a day (TID) | ORAL | 0 refills | Status: DC | PRN

## 2016-08-19 NOTE — Progress Notes (Signed)
Name: Annette Arnold   MRN: [id number]    DOB: 28-Jul-1961   Date:08/19/2016       Progress Note  Subjective  Chief Complaint  Chief Complaint  Patient presents with  . Anxiety    medication refills  . Pain    Anxiety  Presents for follow-up visit. Symptoms include excessive worry, muscle tension, nervous/anxious behavior and panic. The severity of symptoms is moderate. The quality of sleep is good.    Back Pain  This is a chronic problem. The problem is unchanged. The pain is present in the lumbar spine. The pain radiates to the left thigh and right thigh. The pain is at a severity of 3/10. The symptoms are aggravated by sitting (prolonged sitting makes it worse.). Associated symptoms include headaches. Pertinent negatives include no bladder incontinence, bowel incontinence or leg pain. She has tried analgesics and heat for the symptoms.  Neck Pain   This is a chronic problem. The problem has been unchanged. The pain is present in the midline. The pain is at a severity of 5/10. The symptoms are aggravated by position (sleeping wrong, sitting up too long can make it worse.). Associated symptoms include headaches. Pertinent negatives include no leg pain. She has tried oral narcotics (Requesting prescription for a specialized neck pilow by [facility] to help decrease her neck pain.) for the symptoms. The treatment provided significant relief.     Past Medical History:  Diagnosis Date  . Anxiety   . Chronic pain   . Hyperlipidemia     Past Surgical History:  Procedure Laterality Date  . ABDOMINAL HYSTERECTOMY    . APPENDECTOMY    . BREAST LUMPECTOMY    . CARPAL TUNNEL RELEASE    . CHOLECYSTECTOMY      Family History  Problem Relation Age of Onset  . Cancer Mother   . Diabetes Mother   . Thyroid disease Mother   . Heart disease Father   . Alcohol abuse Father   . Drug abuse Sister   . Paranoid behavior Sister     Social History   Social History  . Marital status: Married    Spouse name: N/A  . Number of children: N/A  . Years of education: N/A   Occupational History  . Not on file.   Social History Main Topics  . Smoking status: Current Every Day Smoker    Packs/day: 1.00    Years: 30.00    Types: Cigarettes  . Smokeless tobacco: Never Used  . Alcohol use No  . Drug use: No  . Sexual activity: Yes   Other Topics Concern  . Not on file   Social History Narrative  . No narrative on file     Current Outpatient Prescriptions:  .  ALPRAZolam (XANAX) 0.5 MG tablet, Take 1 tablet (0.5 mg total) by mouth 2 (two) times daily as needed for anxiety., Disp: 60 tablet, Rfl: 0 .  Apremilast (OTEZLA) 30 MG TABS, Take by mouth 2 (two) times daily., Disp: , Rfl:  .  Aspirin-Salicylamide-Caffeine (BC HEADACHE POWDER PO), Take by mouth. Reported on 07/18/2016, Disp: , Rfl:  .  calcipotriene (DOVONOX) 0.005 % cream, apply to affected area once daily to twice a day if needed for ps...  (REFER TO PRESCRIPTION NOTES)., Disp: , Rfl: 0 .  clobetasol cream (TEMOVATE) 0.05 %, Apply topically 2 (two) times daily., Disp: 30 g, Rfl: 0 .  cyclobenzaprine (FLEXERIL) 5 MG tablet, Take 1 tablet (5 mg total) by mouth at bedtime., Disp:  30 tablet, Rfl: 0 .  diclofenac sodium (VOLTAREN) 1 % GEL, Apply 2 g topically 4 (four) times daily., Disp: 1 Tube, Rfl: 0 .  lidocaine (LIDODERM) 5 %, , Disp: , Rfl: 0 .  lubiprostone (AMITIZA) 24 MCG capsule, Take 1 capsule (24 mcg total) by mouth 2 (two) times daily with a meal., Disp: 60 capsule, Rfl: 0 .  Oxycodone HCl 10 MG TABS, Take 1 tablet (10 mg total) by mouth 3 (three) times daily as needed., Disp: 90 tablet, Rfl: 0 .  pravastatin (PRAVACHOL) 40 MG tablet, Take 1 tablet (40 mg total) by mouth daily., Disp: 90 tablet, Rfl: 0 .  prazosin (MINIPRESS) 2 MG capsule, Take 1 capsule (2 mg total) by mouth at bedtime., Disp: 30 capsule, Rfl: 0  Allergies  Allergen Reactions  . Codeine Nausea And Vomiting     Review of Systems    Gastrointestinal: Negative for bowel incontinence.  Genitourinary: Negative for bladder incontinence.  Musculoskeletal: Positive for back pain and neck pain.  Neurological: Positive for headaches.  Psychiatric/Behavioral: The patient is nervous/anxious.     Objective  Vitals:   08/19/16 0833  BP: 102/68  Pulse: (!) 104  Resp: 16  Temp: 98.1 F (36.7 C)  TempSrc: Oral  SpO2: 96%  Weight: 117 lb 11.2 oz (53.4 kg)  Height: 5\' 1"  (1.549 m)    Physical Exam  Constitutional: She is oriented to person, place, and time and well-developed, well-nourished, and in no distress.  HENT:  Head: Normocephalic and atraumatic.  Cardiovascular: Normal rate, regular rhythm and normal heart sounds.   No murmur heard. Pulmonary/Chest: Effort normal and breath sounds normal. She has no wheezes.  Abdominal: Soft. Bowel sounds are normal.  Musculoskeletal:       Cervical back: She exhibits tenderness, pain and spasm.       Back:       Right upper leg: She exhibits no swelling and no edema.       Left upper leg: She exhibits no swelling and no edema.  tenderness and spasm along the lumbar paraspinal muscles.  Neurological: She is alert and oriented to person, place, and time.  Psychiatric: Mood, memory, affect and judgment normal. Her mood appears not anxious. She does not exhibit a depressed mood.  Nursing note and vitals reviewed.   Assessment & Plan  1. Anxiety Stable, compliant with controlled substances agreement and understands the risks associated concurrent opioid and benzodiazepine administration. She was takes alprazolam separately from - ALPRAZolam (XANAX) 0.5 MG tablet; Take 1 tablet (0.5 mg total) by mouth 2 (two) times daily as needed for anxiety.  Dispense: 60 tablet; Refill: 0  2. Chronic upper back pain Associated muscle spasm. Prescription for oxycodone provided along with prescription for a neck support. - Oxycodone HCl 10 MG TABS; Take 1 tablet (10 mg total) by mouth 3  (three) times daily as needed.  Dispense: 90 tablet; Refill: 0  3. Chronic low back pain Stable, responsive to opioid therapy. - Oxycodone HCl 10 MG TABS; Take 1 tablet (10 mg total) by mouth 3 (three) times daily as needed.  Dispense: 90 tablet; Refill: 0   Edlin Ford [facility] 08/19/2016 8:49 AM

## 2016-09-10 ENCOUNTER — Ambulatory Visit: Payer: Self-pay | Admitting: Family Medicine

## 2016-09-11 ENCOUNTER — Encounter: Payer: Self-pay | Admitting: Family Medicine

## 2016-09-11 ENCOUNTER — Ambulatory Visit (INDEPENDENT_AMBULATORY_CARE_PROVIDER_SITE_OTHER): Payer: Medicare Other | Admitting: Family Medicine

## 2016-09-11 VITALS — BP 107/70 | HR 105 | Temp 98.7°F | Resp 15 | Ht 61.0 in | Wt 119.0 lb

## 2016-09-11 DIAGNOSIS — E785 Hyperlipidemia, unspecified: Secondary | ICD-10-CM

## 2016-09-11 DIAGNOSIS — M549 Dorsalgia, unspecified: Secondary | ICD-10-CM | POA: Diagnosis not present

## 2016-09-11 DIAGNOSIS — F419 Anxiety disorder, unspecified: Secondary | ICD-10-CM | POA: Diagnosis not present

## 2016-09-11 DIAGNOSIS — G8929 Other chronic pain: Principal | ICD-10-CM

## 2016-09-11 DIAGNOSIS — M542 Cervicalgia: Secondary | ICD-10-CM

## 2016-09-11 MED ORDER — OXYCODONE HCL 10 MG PO TABS: 10.0000 mg | ORAL_TABLET | Freq: Three times a day (TID) | ORAL | 0 refills | Status: DC | PRN

## 2016-09-11 MED ORDER — PRAVASTATIN SODIUM 40 MG PO TABS: 40.0000 mg | ORAL_TABLET | Freq: Every day | ORAL | 1 refills | Status: DC

## 2016-09-11 MED ORDER — ALPRAZOLAM 0.5 MG PO TABS: 0.5000 mg | ORAL_TABLET | Freq: Two times a day (BID) | ORAL | 0 refills | Status: DC | PRN

## 2016-09-11 NOTE — Progress Notes (Signed)
Name: Annette Arnold   MRN: [id number]    DOB: 28-Sep-1961   Date:09/11/2016       Progress Note  Subjective  Chief Complaint  Chief Complaint  Patient presents with  . Follow-up    1 mo  . Medication Refill    Anxiety  Presents for follow-up visit. Symptoms include excessive worry, muscle tension, nervous/anxious behavior and panic. Patient reports no shortness of breath. The severity of symptoms is moderate. The quality of sleep is good.    Neck Pain   This is a chronic problem. The problem has been unchanged. The pain is present in the midline. The pain is at a severity of 5/10. The symptoms are aggravated by position (sleeping wrong, sitting up too long can make it worse.). She has tried oral narcotics and home exercises (Has not been able to obtain the neck pillow yet for neck support.) for the symptoms. The treatment provided significant relief.  Hyperlipidemia  This is a chronic problem. The problem is controlled. Recent lipid tests were reviewed and are normal. Pertinent negatives include no myalgias or shortness of breath. Current antihyperlipidemic treatment includes statins. There are no compliance problems.     Past Medical History:  Diagnosis Date  . Anxiety   . Chronic pain   . Hyperlipidemia     Past Surgical History:  Procedure Laterality Date  . ABDOMINAL HYSTERECTOMY    . APPENDECTOMY    . BREAST LUMPECTOMY    . CARPAL TUNNEL RELEASE    . CHOLECYSTECTOMY      Family History  Problem Relation Age of Onset  . Cancer Mother   . Diabetes Mother   . Thyroid disease Mother   . Heart disease Father   . Alcohol abuse Father   . Drug abuse Sister   . Paranoid behavior Sister     Social History   Social History  . Marital status: Married    Spouse name: N/A  . Number of children: N/A  . Years of education: N/A   Occupational History  . Not on file.   Social History Main Topics  . Smoking status: Current Every Day Smoker    Packs/day: 1.00   Years: 30.00    Types: Cigarettes  . Smokeless tobacco: Never Used  . Alcohol use No  . Drug use: No  . Sexual activity: Yes   Other Topics Concern  . Not on file   Social History Narrative  . No narrative on file     Current Outpatient Prescriptions:  .  ALPRAZolam (XANAX) 0.5 MG tablet, Take 1 tablet (0.5 mg total) by mouth 2 (two) times daily as needed for anxiety., Disp: 60 tablet, Rfl: 0 .  Apremilast (OTEZLA) 30 MG TABS, Take by mouth 2 (two) times daily., Disp: , Rfl:  .  Aspirin-Salicylamide-Caffeine (BC HEADACHE POWDER PO), Take by mouth. Reported on 07/18/2016, Disp: , Rfl:  .  calcipotriene (DOVONOX) 0.005 % cream, apply to affected area once daily to twice a day if needed for ps...  (REFER TO PRESCRIPTION NOTES)., Disp: , Rfl: 0 .  clobetasol cream (TEMOVATE) 0.05 %, Apply topically 2 (two) times daily., Disp: 30 g, Rfl: 0 .  cyclobenzaprine (FLEXERIL) 5 MG tablet, Take 1 tablet (5 mg total) by mouth at bedtime., Disp: 30 tablet, Rfl: 0 .  diclofenac sodium (VOLTAREN) 1 % GEL, Apply 2 g topically 4 (four) times daily., Disp: 1 Tube, Rfl: 0 .  lidocaine (LIDODERM) 5 %, , Disp: , Rfl: 0 .  lubiprostone (AMITIZA) 24 MCG capsule, Take 1 capsule (24 mcg total) by mouth 2 (two) times daily with a meal., Disp: 60 capsule, Rfl: 0 .  Oxycodone HCl 10 MG TABS, Take 1 tablet (10 mg total) by mouth 3 (three) times daily as needed., Disp: 90 tablet, Rfl: 0 .  pravastatin (PRAVACHOL) 40 MG tablet, Take 1 tablet (40 mg total) by mouth daily., Disp: 90 tablet, Rfl: 0 .  prazosin (MINIPRESS) 2 MG capsule, Take 1 capsule (2 mg total) by mouth at bedtime., Disp: 30 capsule, Rfl: 0  Allergies  Allergen Reactions  . Codeine Nausea And Vomiting     Review of Systems  Respiratory: Negative for shortness of breath.   Musculoskeletal: Positive for neck pain. Negative for myalgias.  Psychiatric/Behavioral: The patient is nervous/anxious.      Objective  Vitals:   09/11/16 1531  BP:  107/70  Pulse: (!) 105  Resp: 15  Temp: 98.7 F (37.1 C)  TempSrc: Oral  SpO2: 96%  Weight: 119 lb (54 kg)  Height: 5\' 1"  (1.549 m)    Physical Exam  Constitutional: She is oriented to person, place, and time and well-developed, well-nourished, and in no distress.  HENT:  Head: Normocephalic and atraumatic.  Cardiovascular: Normal rate, regular rhythm and normal heart sounds.   No murmur heard. Pulmonary/Chest: Effort normal and breath sounds normal. She has no wheezes.  Abdominal: Soft. Bowel sounds are normal.  Musculoskeletal:       Cervical back: She exhibits tenderness, pain and spasm.       Back:       Right upper leg: She exhibits no swelling and no edema.       Left upper leg: She exhibits no swelling and no edema.  tenderness and spasm along the lumbar paraspinal muscles.  Neurological: She is alert and oriented to person, place, and time.  Psychiatric: Mood, memory, affect and judgment normal. Her mood appears not anxious. She does not exhibit a depressed mood.  Nursing note and vitals reviewed.   Assessment & Plan  1. Anxiety Stable and responsive to alprazolam taken twice daily as needed - ALPRAZolam (XANAX) 0.5 MG tablet; Take 1 tablet (0.5 mg total) by mouth 2 (two) times daily as needed for anxiety.  Dispense: 60 tablet; Refill: 0  2. Chronic neck and back pain Chronic neck and back pain, responsive to oxycodone 10 mg taken up to 3 times daily as needed.Patient compliant with controlled substances agreement. - Oxycodone HCl 10 MG TABS; Take 1 tablet (10 mg total) by mouth 3 (three) times daily as needed.  Dispense: 90 tablet; Refill: 0  3. Dyslipidemia  - pravastatin (PRAVACHOL) 40 MG tablet; Take 1 tablet (40 mg total) by mouth daily.  Dispense: 90 tablet; Refill: 1 - Lipid Profile - COMPLETE METABOLIC PANEL WITH GFR   Ricardo Schubach [facility] 09/11/2016 3:44 PM

## 2016-10-16 ENCOUNTER — Encounter: Payer: Self-pay | Admitting: Family Medicine

## 2016-10-16 ENCOUNTER — Ambulatory Visit (INDEPENDENT_AMBULATORY_CARE_PROVIDER_SITE_OTHER): Payer: Medicare Other | Admitting: Family Medicine

## 2016-10-16 VITALS — BP 112/68 | HR 89 | Temp 98.4°F | Resp 16 | Ht 61.0 in | Wt 120.9 lb

## 2016-10-16 DIAGNOSIS — K5903 Drug induced constipation: Secondary | ICD-10-CM | POA: Diagnosis not present

## 2016-10-16 DIAGNOSIS — F419 Anxiety disorder, unspecified: Secondary | ICD-10-CM

## 2016-10-16 DIAGNOSIS — G8929 Other chronic pain: Secondary | ICD-10-CM

## 2016-10-16 DIAGNOSIS — T402X5A Adverse effect of other opioids, initial encounter: Secondary | ICD-10-CM

## 2016-10-16 DIAGNOSIS — M542 Cervicalgia: Secondary | ICD-10-CM

## 2016-10-16 MED ORDER — OXYCODONE HCL 10 MG PO TABS: 10.0000 mg | ORAL_TABLET | Freq: Three times a day (TID) | ORAL | 0 refills | Status: DC | PRN

## 2016-10-16 MED ORDER — NALOXEGOL OXALATE 25 MG PO TABS: 25.0000 mg | ORAL_TABLET | Freq: Every day | ORAL | 2 refills | Status: DC

## 2016-10-16 MED ORDER — ALPRAZOLAM 0.5 MG PO TABS: 0.5000 mg | ORAL_TABLET | Freq: Two times a day (BID) | ORAL | 0 refills | Status: DC | PRN

## 2016-10-16 NOTE — Progress Notes (Signed)
Name: Annette Arnold   MRN: [id number]    DOB: October 18, 1961   Date:10/16/2016       Progress Note  Subjective  Chief Complaint  Chief Complaint  Patient presents with  . Follow-up    medication refills    Anxiety  Presents for follow-up visit. The problem has been gradually improving. Symptoms include excessive worry, muscle tension, nervous/anxious behavior and panic. Patient reports no nausea. The severity of symptoms is moderate.   Her past medical history is significant for anxiety/panic attacks.  Neck Pain   This is a chronic problem. The problem has been gradually worsening. The pain is present in the midline. The pain is at a severity of 8/10. The symptoms are aggravated by position (sleeping wrong, sitting up too long can make it worse.). Pertinent negatives include no fever. She has tried oral narcotics and neck support for the symptoms. The treatment provided significant relief.  Constipation  This is a chronic problem. The problem has been gradually worsening since onset. Her stool frequency is 1 time per week or less. The stool is described as formed. She does not exercise regularly. Associated symptoms include back pain, bloating and flatus. Pertinent negatives include no fever, nausea or vomiting. Risk factors: opioid therapy vs CIC. She has tried laxatives (Miralax) for the symptoms. The treatment provided mild relief.    Past Medical History:  Diagnosis Date  . Anxiety   . Chronic pain   . Hyperlipidemia     Past Surgical History:  Procedure Laterality Date  . ABDOMINAL HYSTERECTOMY    . APPENDECTOMY    . BREAST LUMPECTOMY    . CARPAL TUNNEL RELEASE    . CHOLECYSTECTOMY      Family History  Problem Relation Age of Onset  . Cancer Mother   . Diabetes Mother   . Thyroid disease Mother   . Heart disease Father   . Alcohol abuse Father   . Drug abuse Sister   . Paranoid behavior Sister     Social History   Social History  . Marital status: Married   Spouse name: N/A  . Number of children: N/A  . Years of education: N/A   Occupational History  . Not on file.   Social History Main Topics  . Smoking status: Current Every Day Smoker    Packs/day: 1.00    Years: 30.00    Types: Cigarettes  . Smokeless tobacco: Never Used  . Alcohol use No  . Drug use: No  . Sexual activity: Yes   Other Topics Concern  . Not on file   Social History Narrative  . No narrative on file     Current Outpatient Prescriptions:  .  ALPRAZolam (XANAX) 0.5 MG tablet, Take 1 tablet (0.5 mg total) by mouth 2 (two) times daily as needed for anxiety., Disp: 60 tablet, Rfl: 0 .  Apremilast (OTEZLA) 30 MG TABS, Take by mouth 2 (two) times daily., Disp: , Rfl:  .  Aspirin-Salicylamide-Caffeine (BC HEADACHE POWDER PO), Take by mouth. Reported on 07/18/2016, Disp: , Rfl:  .  calcipotriene (DOVONOX) 0.005 % cream, apply to affected area once daily to twice a day if needed for ps...  (REFER TO PRESCRIPTION NOTES)., Disp: , Rfl: 0 .  clobetasol cream (TEMOVATE) 0.05 %, Apply topically 2 (two) times daily., Disp: 30 g, Rfl: 0 .  cyclobenzaprine (FLEXERIL) 5 MG tablet, Take 1 tablet (5 mg total) by mouth at bedtime., Disp: 30 tablet, Rfl: 0 .  diclofenac sodium (VOLTAREN) 1 %  GEL, Apply 2 g topically 4 (four) times daily., Disp: 1 Tube, Rfl: 0 .  lidocaine (LIDODERM) 5 %, , Disp: , Rfl: 0 .  lubiprostone (AMITIZA) 24 MCG capsule, Take 1 capsule (24 mcg total) by mouth 2 (two) times daily with a meal., Disp: 60 capsule, Rfl: 0 .  Oxycodone HCl 10 MG TABS, Take 1 tablet (10 mg total) by mouth 3 (three) times daily as needed., Disp: 90 tablet, Rfl: 0 .  pravastatin (PRAVACHOL) 40 MG tablet, Take 1 tablet (40 mg total) by mouth daily., Disp: 90 tablet, Rfl: 1 .  prazosin (MINIPRESS) 2 MG capsule, Take 1 capsule (2 mg total) by mouth at bedtime., Disp: 30 capsule, Rfl: 0  Allergies  Allergen Reactions  . Codeine Nausea And Vomiting     Review of Systems    Constitutional: Negative for fever.  Gastrointestinal: Positive for bloating, constipation and flatus. Negative for blood in stool, nausea and vomiting.  Musculoskeletal: Positive for back pain and neck pain.  Psychiatric/Behavioral: Negative for depression. The patient is nervous/anxious.       Objective  Vitals:   10/16/16 1048  BP: 112/68  Pulse: 89  Resp: 16  Temp: 98.4 F (36.9 C)  TempSrc: Oral  SpO2: 96%  Weight: 120 lb 14.4 oz (54.8 kg)  Height: 5\' 1"  (1.549 m)    Physical Exam  Constitutional: She is oriented to person, place, and time and well-developed, well-nourished, and in no distress.  HENT:  Head: Normocephalic and atraumatic.  Cardiovascular: Normal rate, regular rhythm, S1 normal, S2 normal and normal heart sounds.   No murmur heard. Pulmonary/Chest: Effort normal and breath sounds normal. She has no wheezes.  Abdominal: Soft. Bowel sounds are normal. There is generalized tenderness.  Musculoskeletal:       Cervical back: She exhibits tenderness, pain and spasm.       Back:       Right upper leg: She exhibits no swelling and no edema.       Left upper leg: She exhibits no swelling and no edema.  tenderness and spasm along the lumbar paraspinal muscles.  Neurological: She is alert and oriented to person, place, and time.  Psychiatric: Mood, memory, affect and judgment normal. Her mood appears not anxious. She does not exhibit a depressed mood.  Nursing note and vitals reviewed.    Assessment & Plan  1. Constipation due to opioid therapy DC MiraLAX and start on Movantik for relief of probable opioid-induced constipation - naloxegol oxalate (MOVANTIK) 25 MG TABS tablet; Take 1 tablet (25 mg total) by mouth daily.  Dispense: 30 tablet; Refill: 2  2. Anxiety Stable and responsive to alprazolam taken twice a day when necessary, patient compliant with controlled substances agreement and understands the dependence potential, side effects and drug  interactions of benzodiazepines. Refills provided and follow-up in one month - ALPRAZolam (XANAX) 0.5 MG tablet; Take 1 tablet (0.5 mg total) by mouth 2 (two) times daily as needed for anxiety.  Dispense: 60 tablet; Refill: 0  3. Chronic neck pain Stable, responsive to opioid therapy along with heating pad and neck support. Patient compliant with controlled substances agreement - Oxycodone HCl 10 MG TABS; Take 1 tablet (10 mg total) by mouth 3 (three) times daily as needed.  Dispense: 90 tablet; Refill: 0     Raychell Holcomb [facility] 10/16/2016 10:51 AM

## 2016-10-17 ENCOUNTER — Ambulatory Visit: Payer: Self-pay | Admitting: Family Medicine

## 2016-10-18 ENCOUNTER — Ambulatory Visit: Payer: Self-pay | Admitting: Family Medicine

## 2016-11-11 ENCOUNTER — Telehealth: Payer: Self-pay | Admitting: Family Medicine

## 2016-11-11 DIAGNOSIS — K5903 Drug induced constipation: Secondary | ICD-10-CM

## 2016-11-11 DIAGNOSIS — T402X5A Adverse effect of other opioids, initial encounter: Principal | ICD-10-CM

## 2016-11-11 NOTE — Telephone Encounter (Signed)
Patient has taken MiraLAX in the past which worked moderately well, she was started on Movantik, if it's not working she should be referred to gastroenterology for further management. Please confirm and we can place a referral

## 2016-11-11 NOTE — Telephone Encounter (Signed)
PT SAID THAT WHEN SHE CAME IN TO SEE THE DR LAST MONTH HE PLACED HER ON MOVANTIK TO HELP HER GO TO THE BATHROOM SINCE SHE IS ON PAIN MEDICATION AND SHE HAS NOT BEEN ABLE TO GO TO THE BATHROOM BUT 2 TIMES SINCE 2 TO 3 WKS AGO. WANTS TO KNOW WHAT SHE CAN TAKE FOR SHE IS IN DISCOMFORT FROM NOT BEING ABLE TO GO TO THE BATHROOM IN SO LONG.  PLEASE ADVISE.

## 2016-11-12 NOTE — Telephone Encounter (Signed)
Referral to gastroenterology has been placed

## 2016-11-12 NOTE — Telephone Encounter (Signed)
Annette Arnold LET ME KNOW WHEN YOU HAVE DONE THIS PLEASE. tHANKS

## 2016-11-12 NOTE — Telephone Encounter (Signed)
SPOKE WITH PATIENT THIS MORNING AND SHE SAYS GO AHEAD WITH PLACING A REFERRAL IN FOR HER FOR SHE IS NO BETTER.

## 2016-11-12 NOTE — Telephone Encounter (Signed)
Patient was informed that Dr. Lucilla Lame is with [facility] in [place]. Their number was given so that she could set-up an appt.

## 2016-11-12 NOTE — Telephone Encounter (Signed)
LFT MESSAGE °

## 2016-11-12 NOTE — Telephone Encounter (Signed)
Sent to [facility] Attn Dr. Allen Norris

## 2016-11-13 ENCOUNTER — Encounter: Payer: Self-pay | Admitting: Family Medicine

## 2016-11-13 ENCOUNTER — Ambulatory Visit (INDEPENDENT_AMBULATORY_CARE_PROVIDER_SITE_OTHER): Payer: Medicare Other | Admitting: Family Medicine

## 2016-11-13 VITALS — BP 110/69 | HR 89 | Temp 98.3°F | Resp 17 | Ht 61.0 in | Wt 119.1 lb

## 2016-11-13 DIAGNOSIS — F419 Anxiety disorder, unspecified: Secondary | ICD-10-CM | POA: Diagnosis not present

## 2016-11-13 DIAGNOSIS — K581 Irritable bowel syndrome with constipation: Secondary | ICD-10-CM

## 2016-11-13 DIAGNOSIS — G8929 Other chronic pain: Secondary | ICD-10-CM | POA: Diagnosis not present

## 2016-11-13 DIAGNOSIS — M542 Cervicalgia: Secondary | ICD-10-CM

## 2016-11-13 MED ORDER — LINACLOTIDE 290 MCG PO CAPS: 290.0000 ug | ORAL_CAPSULE | Freq: Every day | ORAL | 0 refills | Status: DC

## 2016-11-13 MED ORDER — ALPRAZOLAM 0.5 MG PO TABS: 0.5000 mg | ORAL_TABLET | Freq: Two times a day (BID) | ORAL | 0 refills | Status: DC | PRN

## 2016-11-13 MED ORDER — OXYCODONE HCL 10 MG PO TABS: 10.0000 mg | ORAL_TABLET | Freq: Three times a day (TID) | ORAL | 0 refills | Status: DC | PRN

## 2016-11-13 NOTE — Progress Notes (Signed)
Name: Annette Arnold   MRN: [id number]    DOB: 1961/12/14   Date:11/13/2016       Progress Note  Subjective  Chief Complaint  Chief Complaint  Patient presents with  . Follow-up    1 mo  . Medication Refill    Anxiety  Presents for follow-up visit. The problem has been gradually improving. Symptoms include excessive worry, irritability, muscle tension, nervous/anxious behavior and panic. Patient reports no nausea. The severity of symptoms is moderate and causing significant distress. The quality of sleep is fair.   Her past medical history is significant for anxiety/panic attacks.  Neck Pain   This is a chronic problem. The problem has been unchanged. The pain is present in the midline. The pain is at a severity of 6/10. The symptoms are aggravated by position (sleeping wrong, sitting up too long can make it worse.). Pertinent negatives include no fever. She has tried oral narcotics and neck support for the symptoms. The treatment provided significant relief.  Constipation  This is a chronic problem. The problem has been gradually worsening since onset. Her stool frequency is 1 time per week or less. The stool is described as formed. The patient is on a high fiber diet. She does not exercise regularly. There has not been adequate water intake. Associated symptoms include abdominal pain (bloating), back pain, bloating and flatus. Pertinent negatives include no fever, nausea or vomiting. Risk factors include stress (opioid therapy vs CIC, has history of constipation going back to when she was not on chronic opioid therapy). She has tried laxatives (Miralax. Movantik, which only helped marginally) for the symptoms. The treatment provided mild relief.      Past Medical History:  Diagnosis Date  . Anxiety   . Chronic pain   . Hyperlipidemia     Past Surgical History:  Procedure Laterality Date  . ABDOMINAL HYSTERECTOMY    . APPENDECTOMY    . BREAST LUMPECTOMY    . CARPAL TUNNEL  RELEASE    . CHOLECYSTECTOMY      Family History  Problem Relation Age of Onset  . Cancer Mother   . Diabetes Mother   . Thyroid disease Mother   . Heart disease Father   . Alcohol abuse Father   . Drug abuse Sister   . Paranoid behavior Sister     Social History   Social History  . Marital status: Married    Spouse name: N/A  . Number of children: N/A  . Years of education: N/A   Occupational History  . Not on file.   Social History Main Topics  . Smoking status: Current Every Day Smoker    Packs/day: 1.00    Years: 30.00    Types: Cigarettes  . Smokeless tobacco: Never Used  . Alcohol use No  . Drug use: No  . Sexual activity: Yes   Other Topics Concern  . Not on file   Social History Narrative  . No narrative on file     Current Outpatient Prescriptions:  .  ALPRAZolam (XANAX) 0.5 MG tablet, Take 1 tablet (0.5 mg total) by mouth 2 (two) times daily as needed for anxiety., Disp: 60 tablet, Rfl: 0 .  Apremilast (OTEZLA) 30 MG TABS, Take by mouth 2 (two) times daily., Disp: , Rfl:  .  Aspirin-Salicylamide-Caffeine (BC HEADACHE POWDER PO), Take by mouth. Reported on 07/18/2016, Disp: , Rfl:  .  calcipotriene (DOVONOX) 0.005 % cream, apply to affected area once daily to twice a day if  needed for ps...  (REFER TO PRESCRIPTION NOTES)., Disp: , Rfl: 0 .  clobetasol cream (TEMOVATE) 0.05 %, Apply topically 2 (two) times daily., Disp: 30 g, Rfl: 0 .  cyclobenzaprine (FLEXERIL) 5 MG tablet, Take 1 tablet (5 mg total) by mouth at bedtime., Disp: 30 tablet, Rfl: 0 .  diclofenac sodium (VOLTAREN) 1 % GEL, Apply 2 g topically 4 (four) times daily., Disp: 1 Tube, Rfl: 0 .  lidocaine (LIDODERM) 5 %, , Disp: , Rfl: 0 .  naloxegol oxalate (MOVANTIK) 25 MG TABS tablet, Take 1 tablet (25 mg total) by mouth daily., Disp: 30 tablet, Rfl: 2 .  Oxycodone HCl 10 MG TABS, Take 1 tablet (10 mg total) by mouth 3 (three) times daily as needed., Disp: 90 tablet, Rfl: 0 .  pravastatin  (PRAVACHOL) 40 MG tablet, Take 1 tablet (40 mg total) by mouth daily., Disp: 90 tablet, Rfl: 1 .  prazosin (MINIPRESS) 2 MG capsule, Take 1 capsule (2 mg total) by mouth at bedtime., Disp: 30 capsule, Rfl: 0  Allergies  Allergen Reactions  . Codeine Nausea And Vomiting     Review of Systems  Constitutional: Positive for irritability. Negative for fever.  Gastrointestinal: Positive for abdominal pain (bloating), bloating, constipation and flatus. Negative for nausea and vomiting.  Musculoskeletal: Positive for back pain and neck pain.  Psychiatric/Behavioral: The patient is nervous/anxious.      Objective  Vitals:   11/13/16 1021  BP: 110/69  Pulse: 89  Resp: 17  Temp: 98.3 F (36.8 C)  TempSrc: Oral  SpO2: 96%  Weight: 119 lb 1.6 oz (54 kg)  Height: 5\' 1"  (1.549 m)    Physical Exam  Constitutional: She is oriented to person, place, and time and well-developed, well-nourished, and in no distress.  HENT:  Head: Normocephalic and atraumatic.  Cardiovascular: Normal rate, regular rhythm, S1 normal, S2 normal and normal heart sounds.   No murmur heard. Pulmonary/Chest: Effort normal and breath sounds normal. She has no wheezes.  Abdominal: Soft. Bowel sounds are normal. There is generalized tenderness (general soreness and bloating, no tenderness to palpation.).  Musculoskeletal:       Cervical back: She exhibits tenderness, pain and spasm.       Back:       Right upper leg: She exhibits no swelling and no edema.       Left upper leg: She exhibits no swelling and no edema.  Neurological: She is alert and oriented to person, place, and time.  Psychiatric: Mood, memory, affect and judgment normal. Her mood appears not anxious. She does not exhibit a depressed mood.  Nursing note and vitals reviewed.     Assessment & Plan  1. Anxiety Stable and responsive to alprazolam taken twice daily when needed, patient compliant with controlled substances agreement and understands  the dependence potential, side effects and drug interactions of benzodiazepines. - ALPRAZolam (XANAX) 0.5 MG tablet; Take 1 tablet (0.5 mg total) by mouth 2 (two) times daily as needed for anxiety.  Dispense: 60 tablet; Refill: 0  2. Chronic neck pain Stable and responsive to oxycodone taken up to 3 times daily when needed for pain, refills provided and follow-up in one month - Oxycodone HCl 10 MG TABS; Take 1 tablet (10 mg total) by mouth 3 (three) times daily as needed.  Dispense: 90 tablet; Refill: 0  3. Irritable bowel syndrome with constipation DC Movantik and start patient on Linzess for possible IBS-C, she has already been referred to gastroenterology - linaclotide Rolan Lipa) 290  MCG CAPS capsule; Take 1 capsule (290 mcg total) by mouth daily before breakfast.  Dispense: 30 capsule; Refill: 0   Varie Machamer [facility] 11/13/2016 10:28 AM

## 2016-11-14 ENCOUNTER — Telehealth: Payer: Self-pay | Admitting: Family Medicine

## 2016-11-14 NOTE — Telephone Encounter (Signed)
lvm to reschedule appt for 12/18 to January 8th in p.m. - knb

## 2016-11-18 ENCOUNTER — Other Ambulatory Visit: Payer: Self-pay

## 2016-11-18 NOTE — Telephone Encounter (Signed)
Re-scheduled pt for January 8th lvm to confirm -knb

## 2016-11-20 ENCOUNTER — Ambulatory Visit (INDEPENDENT_AMBULATORY_CARE_PROVIDER_SITE_OTHER): Payer: Medicare Other | Admitting: Gastroenterology

## 2016-11-20 ENCOUNTER — Encounter: Payer: Self-pay | Admitting: Gastroenterology

## 2016-11-20 ENCOUNTER — Telehealth: Payer: Self-pay | Admitting: Family Medicine

## 2016-11-20 VITALS — BP 113/78 | HR 97 | Temp 97.8°F | Ht 61.0 in | Wt 117.8 lb

## 2016-11-20 DIAGNOSIS — G8929 Other chronic pain: Secondary | ICD-10-CM

## 2016-11-20 DIAGNOSIS — M542 Cervicalgia: Principal | ICD-10-CM

## 2016-11-20 DIAGNOSIS — K5900 Constipation, unspecified: Secondary | ICD-10-CM | POA: Diagnosis not present

## 2016-11-20 MED ORDER — NA SULFATE-K SULFATE-MG SULF 17.5-3.13-1.6 GM/177ML PO SOLN: 1.0000 | ORAL | 0 refills | Status: DC

## 2016-11-20 MED ORDER — MAGNESIUM CITRATE PO SOLN: 1.0000 | Freq: Once | ORAL | 0 refills | Status: AC

## 2016-11-20 NOTE — Progress Notes (Signed)
Gastroenterology Consultation  Referring Provider:     Roselee Nova, MD Primary Care Physician:  Keith Rake, MD Primary Gastroenterologist:  Dr. Jonathon Bellows  Reason for Consultation:     Constipation        HPI:   Annette Arnold is a 55 y.o. y/o female referred for consultation & management  by Dr. Keith Rake, MD.    She has been referred for constipation after being seen at the office of Dr Manuella Ghazi on 10/16/2016 . At that time was prescribed Movantix for possible opiod induced constipation.  Constipation: Onset ongoing for 15 years, gradually getting worse after after her hysterectomy 15 years back .  Frequency of bowel movements can go uptoi 14 days without a bowel movement  Consistency attimes like "rocks"  Shape round , any new change-more pencil like  Pain yes has pain before the bowel movement  Bloating/abdominal distension yes Bleeding no  Last colonoscopy she has not had one  Weight loss no  Family history of colon cancer no  Diet not much fruit , not much vegetables either  Narcotic/anticholinergic medications - on oxycodone- for back issues  Laxative use She has tried miralax, movantix which didn't work. Was then started on linzess 72 mcg which made her dizzy  But it did work.  Thyroid abnormalities not checked recently.    Past Medical History:  Diagnosis Date  . Anxiety   . Chronic pain   . Hyperlipidemia     Past Surgical History:  Procedure Laterality Date  . ABDOMINAL HYSTERECTOMY    . APPENDECTOMY    . BREAST LUMPECTOMY    . CARPAL TUNNEL RELEASE    . CHOLECYSTECTOMY      Prior to Admission medications   Medication Sig Start Date End Date Taking? Authorizing Provider  ALPRAZolam Duanne Moron) 0.5 MG tablet Take 1 tablet (0.5 mg total) by mouth 2 (two) times daily as needed for anxiety. 11/13/16  Yes Syed Richmond Campbell, MD  Apremilast (OTEZLA) 30 MG TABS Take by mouth 2 (two) times daily.   Yes Historical Provider, MD  Aspirin-Salicylamide-Caffeine (BC  HEADACHE POWDER PO) Take by mouth. Reported on 07/18/2016   Yes Historical Provider, MD  calcipotriene (DOVONOX) 0.005 % cream apply to affected area once daily to twice a day if needed for ps...  (REFER TO PRESCRIPTION NOTES). 04/26/16  Yes Historical Provider, MD  cyclobenzaprine (FLEXERIL) 5 MG tablet Take 1 tablet (5 mg total) by mouth at bedtime. 04/24/16  Yes Roselee Nova, MD  linaclotide Rolan Lipa) 290 MCG CAPS capsule Take 1 capsule (290 mcg total) by mouth daily before breakfast. 11/13/16  Yes Roselee Nova, MD  Oxycodone HCl 10 MG TABS Take 1 tablet (10 mg total) by mouth 3 (three) times daily as needed. 11/13/16  Yes Roselee Nova, MD  pravastatin (PRAVACHOL) 40 MG tablet Take 1 tablet (40 mg total) by mouth daily. 09/11/16  Yes Roselee Nova, MD    Family History  Problem Relation Age of Onset  . Cancer Mother   . Diabetes Mother   . Thyroid disease Mother   . Heart disease Father   . Alcohol abuse Father   . Drug abuse Sister   . Paranoid behavior Sister      Social History  Substance Use Topics  . Smoking status: Current Every Day Smoker    Packs/day: 1.00    Years: 30.00    Types: Cigarettes  . Smokeless tobacco: Never Used  .  Alcohol use No    Allergies as of 11/20/2016 - Review Complete 11/20/2016  Allergen Reaction Noted  . Codeine Nausea And Vomiting 06/02/2015    Review of Systems:    All systems reviewed and negative except where noted in HPI.   Physical Exam:  BP 113/78   Pulse 97   Temp 97.8 F (36.6 C) (Oral)   Ht 5\' 1"  (1.549 m)   Wt 117 lb 12.8 oz (53.4 kg)   BMI 22.26 kg/m  No LMP recorded. Patient has had a hysterectomy. Psych:  Alert and cooperative. Normal mood and affect. General:   Alert,  Well-developed, well-nourished, pleasant and cooperative in NAD Head:  Normocephalic and atraumatic. Eyes:  Sclera clear, no icterus.   Conjunctiva pink. Ears:  Normal auditory acuity. Nose:  No deformity, discharge, or lesions. Mouth:  No  deformity or lesions,oropharynx pink & moist. Neck:  Supple; no masses or thyromegaly. Lungs:  Respirations even and unlabored.  Clear throughout to auscultation.   No wheezes, crackles, or rhonchi. No acute distress. Heart:  Regular rate and rhythm; no murmurs, clicks, rubs, or gallops. Abdomen:  Normal bowel sounds.  No bruits.  Soft, non-tender and non-distended without masses, hepatosplenomegaly or hernias noted.  No guarding or rebound tenderness.    Skin:  Intact without significant lesions or rashes. No jaundice. Lymph Nodes:  No significant cervical adenopathy. Psych:  Alert and cooperative. Normal mood and affect.  Imaging Studies: No results found.   Assessment and Plan:   Annette Arnold is a 55 y.o. y/o female has been referred for constipation.Long standing , likely due to a combination of change in the anorectal angle from prior hysterectomy, opiod use, diet devoid of fiber.   Plan  1. Clean out with magnesium citrate today and then restart on linzess 72 mcg, she will call me in a week to inform how she is doing 2. 25-30 grams of fiber per day  3. Colonoscopy to rule out any luminal lesion 4. Check TSH  I have discussed alternative options, risks & benefits,  which include, but are not limited to, bleeding, infection, perforation,respiratory complication & drug reaction.  The patient agrees with this plan & written consent will be obtained.     Follow up in 4 weeks   Dr Jonathon Bellows MD

## 2016-11-20 NOTE — Patient Instructions (Addendum)
High-Fiber Diet Fiber, also called dietary fiber, is a type of carbohydrate found in fruits, vegetables, whole grains, and beans. A high-fiber diet can have many health benefits. Your health care provider may recommend a high-fiber diet to help:  Prevent constipation. Fiber can make your bowel movements more regular.  Lower your cholesterol.  Relieve hemorrhoids, uncomplicated diverticulosis, or irritable bowel syndrome.  Prevent overeating as part of a weight-loss plan.  Prevent heart disease, type 2 diabetes, and certain cancers. What is my plan? The recommended daily intake of fiber includes:  38 grams for men under age 66.  42 grams for men over age 84.  36 grams for women under age 46.  15 grams for women over age 15. You can get the recommended daily intake of dietary fiber by eating a variety of fruits, vegetables, grains, and beans. Your health care provider may also recommend a fiber supplement if it is not possible to get enough fiber through your diet. What do I need to know about a high-fiber diet?  Fiber supplements have not been widely studied for their effectiveness, so it is better to get fiber through food sources.  Always check the fiber content on thenutrition facts label of any prepackaged food. Look for foods that contain at least 5 grams of fiber per serving.  Ask your dietitian if you have questions about specific foods that are related to your condition, especially if those foods are not listed in the following section.  Increase your daily fiber consumption gradually. Increasing your intake of dietary fiber too quickly may cause bloating, cramping, or gas.  Drink plenty of water. Water helps you to digest fiber. What foods can I eat? Grains  Whole-grain breads. Multigrain cereal. Oats and oatmeal. Brown rice. Barley. Bulgur wheat. [place]. Bran muffins. Popcorn. Rye wafer crackers. Vegetables  Sweet potatoes. Spinach. Kale. Artichokes. Cabbage. Broccoli.  Green peas. Carrots. Squash. Fruits  Berries. Pears. Apples. Oranges. Avocados. Prunes and raisins. Dried figs. Meats and Other Protein Sources  [place], kidney, pinto, and soy beans. Split peas. Lentils. Nuts and seeds. Dairy  Fiber-fortified yogurt. Beverages  Fiber-fortified soy milk. Fiber-fortified orange juice. Other  Fiber bars. The items listed above may not be a complete list of recommended foods or beverages. Contact your dietitian for more options.  What foods are not recommended? Grains  White bread. Pasta made with refined flour. White rice. Vegetables  Fried potatoes. Canned vegetables. Well-cooked vegetables. Fruits  Fruit juice. Cooked, strained fruit. Meats and Other Protein Sources  Fatty cuts of meat. Fried Sales executive or fried fish. Dairy  Milk. Yogurt. Cream cheese. Sour cream. Beverages  Soft drinks. Other  Cakes and pastries. Butter and oils. The items listed above may not be a complete list of foods and beverages to avoid. Contact your dietitian for more information.  What are some tips for including high-fiber foods in my diet?  Eat a wide variety of high-fiber foods.  Make sure that half of all grains consumed each day are whole grains.  Replace breads and cereals made from refined flour or white flour with whole-grain breads and cereals.  Replace white rice with brown rice, bulgur wheat, or millet.  Start the day with a breakfast that is high in fiber, such as a cereal that contains at least 5 grams of fiber per serving.  Use beans in place of meat in soups, salads, or pasta.  Eat high-fiber snacks, such as berries, raw vegetables, nuts, or popcorn. This information is not intended to replace  advice given to you by your health care provider. Make sure you discuss any questions you have with your health care provider. Document Released: 12/16/2005 Document Revised: 05/23/2016 Document Reviewed: 05/31/2014 Elsevier Interactive Patient Education  2017  [employer]  Constipation, Adult Constipation is when a person:  Poops (has a bowel movement) fewer times in a week than normal.  Has a hard time pooping.  Has poop that is dry, hard, or bigger than normal. Follow these instructions at home: Eating and drinking  Eat foods that have a lot of fiber, such as:  Fresh fruits and vegetables.  Whole grains.  Beans.  Eat less of foods that are high in fat, low in fiber, or overly processed, such as:  [place] fries.  Hamburgers.  Cookies.  Candy.  Soda.  Drink enough fluid to keep your pee (urine) clear or pale yellow. General instructions  Exercise regularly or as told by your doctor.  Go to the restroom when you feel like you need to poop. Do not hold it in.  Take over-the-counter and prescription medicines only as told by your doctor. These include any fiber supplements.  Do pelvic floor retraining exercises, such as:  Doing deep breathing while relaxing your lower belly (abdomen).  Relaxing your pelvic floor while pooping.  Watch your condition for any changes.  Keep all follow-up visits as told by your doctor. This is important. Contact a doctor if:  You have pain that gets worse.  You have a fever.  You have not pooped for 4 days.  You throw up (vomit).  You are not hungry.  You lose weight.  You are bleeding from the anus.  You have thin, pencil-like poop (stool). Get help right away if:  You have a fever, and your symptoms suddenly get worse.  You leak poop or have blood in your poop.  Your belly feels hard or bigger than normal (is bloated).  You have very bad belly pain.  You feel dizzy or you faint. This information is not intended to replace advice given to you by your health care provider. Make sure you discuss any questions you have with your health care provider. Document Released: 06/03/2008 Document Revised: 07/05/2016 Document Reviewed: 06/05/2016 Elsevier Interactive Patient  Education  2017 [employer].

## 2016-11-20 NOTE — Telephone Encounter (Signed)
Routed to Dr. Manuella Ghazi for patient call back and advice  Pt states she has had a headache since she was in here last. She  is requesting a referral to orthopaedic for her neck. Pt states her headaches are coming from her neck. She is having issues with motion of her neck.. Please advise.

## 2016-11-20 NOTE — Telephone Encounter (Signed)
Referral to orthopedics is entered 

## 2016-11-20 NOTE — Telephone Encounter (Signed)
Pt states she has had a headache since she was in here last. She  is requesting a referral to orthopaedic for her neck. Pt states her headaches are coming from her neck. She is having issues with motion of her neck.. Please advise.

## 2016-11-25 ENCOUNTER — Telehealth: Payer: Self-pay | Admitting: Gastroenterology

## 2016-11-25 NOTE — Telephone Encounter (Signed)
Patient called because she is still backed up and ,"it's going on a month now." Patient states that something has to be done soon. She wants to know if she needs to go to the ED or what? Please advise.

## 2016-11-25 NOTE — Telephone Encounter (Signed)
Advised pt to do another magnesium citrate or OTC enema. Also advised her if she is in that much pain she should be accessed at the urgent care or ER to make sure there are no blockages. Pt will contact me if no improvement. Pt is scheduled for a colonoscopy on Thursday.

## 2016-11-27 ENCOUNTER — Encounter: Payer: Self-pay | Admitting: *Deleted

## 2016-11-28 ENCOUNTER — Ambulatory Visit: Admission: RE | Admit: 2016-11-28 | Payer: Medicare Other | Source: Ambulatory Visit | Admitting: Gastroenterology

## 2016-11-28 ENCOUNTER — Telehealth: Payer: Self-pay | Admitting: Gastroenterology

## 2016-11-28 ENCOUNTER — Encounter: Admission: RE | Payer: Self-pay | Source: Ambulatory Visit

## 2016-11-28 SURGERY — COLONOSCOPY WITH PROPOFOL
Anesthesia: General

## 2016-11-28 NOTE — Telephone Encounter (Signed)
Patient left a voice message for you to call her. °

## 2016-11-29 NOTE — Telephone Encounter (Signed)
LVM for pt to return my call.

## 2016-12-04 DIAGNOSIS — M542 Cervicalgia: Secondary | ICD-10-CM | POA: Diagnosis not present

## 2016-12-13 ENCOUNTER — Ambulatory Visit (INDEPENDENT_AMBULATORY_CARE_PROVIDER_SITE_OTHER): Payer: Medicare Other | Admitting: Family Medicine

## 2016-12-13 ENCOUNTER — Encounter: Payer: Self-pay | Admitting: Family Medicine

## 2016-12-13 VITALS — BP 122/76 | HR 100 | Temp 97.9°F | Resp 16 | Ht 61.0 in | Wt 116.5 lb

## 2016-12-13 DIAGNOSIS — W010XXA Fall on same level from slipping, tripping and stumbling without subsequent striking against object, initial encounter: Secondary | ICD-10-CM

## 2016-12-13 DIAGNOSIS — G8929 Other chronic pain: Secondary | ICD-10-CM | POA: Diagnosis not present

## 2016-12-13 DIAGNOSIS — F419 Anxiety disorder, unspecified: Secondary | ICD-10-CM

## 2016-12-13 DIAGNOSIS — M542 Cervicalgia: Secondary | ICD-10-CM

## 2016-12-13 MED ORDER — OXYCODONE HCL 10 MG PO TABS: 10.0000 mg | ORAL_TABLET | Freq: Three times a day (TID) | ORAL | 0 refills | Status: DC | PRN

## 2016-12-13 MED ORDER — ALPRAZOLAM 0.5 MG PO TABS: 0.5000 mg | ORAL_TABLET | Freq: Two times a day (BID) | ORAL | 0 refills | Status: DC | PRN

## 2016-12-13 NOTE — Progress Notes (Signed)
Name: Annette Arnold   MRN: [id number]    DOB: 1961/04/16   Date:12/13/2016       Progress Note  Subjective  Chief Complaint  Chief Complaint  Patient presents with  . Follow-up     medication refills    Fall  The fall occurred while walking (stepped onto a clear sheet of ice on her deck, fell down knee first and then her right hip). She landed on hard floor. There was no blood loss. The point of impact was the right knee and right hip. The pain is present in the right upper leg and right hip. The pain is at a severity of 7/10. The pain is moderate. The symptoms are aggravated by standing. She has tried rest and heat (Oxycodone 10 mg helps relieve the pain) for the symptoms.  Anxiety  Presents for follow-up visit. The problem has been gradually improving. Symptoms include excessive worry, irritability, muscle tension, nervous/anxious behavior and panic. The severity of symptoms is moderate and causing significant distress. The quality of sleep is fair.   Her past medical history is significant for anxiety/panic attacks.  Neck Pain   This is a chronic problem. The problem has been unchanged. The pain is present in the midline. The pain is at a severity of 6/10. The symptoms are aggravated by position. She has tried oral narcotics and neck support for the symptoms. The treatment provided significant relief.     Past Medical History:  Diagnosis Date  . Anxiety   . Chronic pain   . Hyperlipidemia     Past Surgical History:  Procedure Laterality Date  . ABDOMINAL HYSTERECTOMY    . APPENDECTOMY    . BREAST LUMPECTOMY    . CARPAL TUNNEL RELEASE    . CHOLECYSTECTOMY      Family History  Problem Relation Age of Onset  . Cancer Mother   . Diabetes Mother   . Thyroid disease Mother   . Heart disease Father   . Alcohol abuse Father   . Drug abuse Sister   . Paranoid behavior Sister     Social History   Social History  . Marital status: Married    Spouse name: N/A  . Number  of children: N/A  . Years of education: N/A   Occupational History  . Not on file.   Social History Main Topics  . Smoking status: Current Every Day Smoker    Packs/day: 1.00    Years: 30.00    Types: Cigarettes  . Smokeless tobacco: Never Used  . Alcohol use No  . Drug use: No  . Sexual activity: Yes   Other Topics Concern  . Not on file   Social History Narrative  . No narrative on file     Current Outpatient Prescriptions:  .  ALPRAZolam (XANAX) 0.5 MG tablet, Take 1 tablet (0.5 mg total) by mouth 2 (two) times daily as needed for anxiety., Disp: 60 tablet, Rfl: 0 .  Apremilast (OTEZLA) 30 MG TABS, Take by mouth 2 (two) times daily., Disp: , Rfl:  .  Aspirin-Salicylamide-Caffeine (BC HEADACHE POWDER PO), Take by mouth. Reported on 07/18/2016, Disp: , Rfl:  .  calcipotriene (DOVONOX) 0.005 % cream, apply to affected area once daily to twice a day if needed for ps...  (REFER TO PRESCRIPTION NOTES)., Disp: , Rfl: 0 .  cyclobenzaprine (FLEXERIL) 5 MG tablet, Take 1 tablet (5 mg total) by mouth at bedtime., Disp: 30 tablet, Rfl: 0 .  Na Sulfate-K Sulfate-Mg Sulf (SUPREP  BOWEL PREP KIT) 17.5-3.13-1.6 GM/180ML SOLN, Take 1 kit by mouth as directed., Disp: 1 Bottle, Rfl: 0 .  Oxycodone HCl 10 MG TABS, Take 1 tablet (10 mg total) by mouth 3 (three) times daily as needed., Disp: 90 tablet, Rfl: 0 .  pravastatin (PRAVACHOL) 40 MG tablet, Take 1 tablet (40 mg total) by mouth daily., Disp: 90 tablet, Rfl: 1  Allergies  Allergen Reactions  . Codeine Nausea And Vomiting     Review of Systems  Constitutional: Positive for irritability.  Musculoskeletal: Positive for neck pain.  Psychiatric/Behavioral: The patient is nervous/anxious.      Objective  Vitals:   12/13/16 1143  BP: 122/76  Pulse: 100  Resp: 16  Temp: 97.9 F (36.6 C)  TempSrc: Oral  SpO2: 96%  Weight: 116 lb 8 oz (52.8 kg)  Height: 5' 1"  (1.549 m)    Physical Exam  Constitutional: She is oriented to  person, place, and time and well-developed, well-nourished, and in no distress.  HENT:  Head: Normocephalic and atraumatic.  Cardiovascular: Normal rate, regular rhythm and normal heart sounds.   No murmur heard. Pulmonary/Chest: Effort normal and breath sounds normal. She has no wheezes.  Abdominal: Soft. Bowel sounds are normal.  Musculoskeletal:       Right hip: She exhibits tenderness. She exhibits no swelling.       Right knee: She exhibits no swelling. Tenderness found. Lateral joint line tenderness noted.       Cervical back: She exhibits tenderness, pain and spasm.       Back:       Right upper leg: She exhibits no swelling and no edema.       Left upper leg: She exhibits no swelling and no edema.       Legs: Neurological: She is alert and oriented to person, place, and time.  Psychiatric: Mood, memory, affect and judgment normal.  Nursing note and vitals reviewed.    Assessment & Plan  1. Anxiety Overall stable, continue on alprazolam as prescribed. Patient compliant with controlled substances agreement. Refills provided - ALPRAZolam (XANAX) 0.5 MG tablet; Take 1 tablet (0.5 mg total) by mouth 2 (two) times daily as needed for anxiety.  Dispense: 60 tablet; Refill: 0  2. Chronic neck pain Stable and responsive to opioid therapy, patient compliant with controlled substances agreement. Refills provided - Oxycodone HCl 10 MG TABS; Take 1 tablet (10 mg total) by mouth 3 (three) times daily as needed.  Dispense: 90 tablet; Refill: 0  3. Fall from slipping on wet surface, initial encounter Patient expressed a fall on her deck when she slipped on a 10 she does ice last weekend, pain is improving, she is taking oxycodone for chronic neck pain which helps relieve the pain in her hip as well. No indication for x-ray at this time. Reassured   Luisa Louk [facility] 12/13/2016 11:47 AM

## 2016-12-16 ENCOUNTER — Ambulatory Visit: Payer: Self-pay

## 2016-12-16 NOTE — Telephone Encounter (Signed)
LVM again for pt to return my call.  

## 2017-01-06 ENCOUNTER — Ambulatory Visit (INDEPENDENT_AMBULATORY_CARE_PROVIDER_SITE_OTHER): Payer: Medicare Other

## 2017-01-06 VITALS — BP 98/60 | HR 84 | Temp 97.5°F | Ht 61.0 in | Wt 121.2 lb

## 2017-01-06 DIAGNOSIS — Z1239 Encounter for other screening for malignant neoplasm of breast: Secondary | ICD-10-CM

## 2017-01-06 DIAGNOSIS — Z0000 Encounter for general adult medical examination without abnormal findings: Secondary | ICD-10-CM

## 2017-01-06 DIAGNOSIS — Z1231 Encounter for screening mammogram for malignant neoplasm of breast: Secondary | ICD-10-CM

## 2017-01-06 NOTE — Patient Instructions (Signed)
Annette Arnold , Thank you for taking time to come for your Medicare Wellness Visit. I appreciate your ongoing commitment to your health goals. Please review the following plan we discussed and let me know if I can assist you in the future.   These are the goals we discussed: Goals    . Increase water intake          Starting 01/06/17, I will increase my water intake to 3 glasses a day.       This is a list of the screening recommended for you and due dates:  Health Maintenance  Topic Date Due  . Mammogram  01/30/2017*  . Flu Shot  08/13/2017*  . Colon Cancer Screening  12/30/2017*  .  Hepatitis C: One time screening is recommended by Center for Disease Control  (CDC) for  adults born from [birth year] through 1963.   12/30/2017*  . HIV Screening  12/30/2017*  . Pap Smear  12/30/2026*  . Tetanus Vaccine  03/22/2020  *Topic was postponed. The date shown is not the original due date.   Preventive Care for Adults  A healthy lifestyle and preventive care can promote health and wellness. Preventive health guidelines for adults include the following key practices.  . A routine yearly physical is a good way to check with your health care provider about your health and preventive screening. It is a chance to share any concerns and updates on your health and to receive a thorough exam.  . Visit your dentist for a routine exam and preventive care every 6 months. Brush your teeth twice a day and floss once a day. Good oral hygiene prevents tooth decay and gum disease.  . The frequency of eye exams is based on your age, health, family medical history, use  of contact lenses, and other factors. Follow your health care provider's ecommendations for frequency of eye exams.  . Eat a healthy diet. Foods like vegetables, fruits, whole grains, low-fat dairy products, and lean protein foods contain the nutrients you need without too many calories. Decrease your intake of foods high in solid fats, added sugars, and  salt. Eat the right amount of calories for you. Get information about a proper diet from your health care provider, if necessary.  . Regular physical exercise is one of the most important things you can do for your health. Most adults should get at least 150 minutes of moderate-intensity exercise (any activity that increases your heart rate and causes you to sweat) each week. In addition, most adults need muscle-strengthening exercises on 2 or more days a week.  Silver Sneakers may be a benefit available to you. To determine eligibility, you may visit the website: www.silversneakers.com or contact program at (503) 826-7562 Mon-Fri between 8AM-8PM.   . Maintain a healthy weight. The body mass index (BMI) is a screening tool to identify possible weight problems. It provides an estimate of body fat based on height and weight. Your health care provider can find your BMI and can help you achieve or maintain a healthy weight.   For adults 20 years and older: ? A BMI below 18.5 is considered underweight. ? A BMI of 18.5 to 24.9 is normal. ? A BMI of 25 to 29.9 is considered overweight. ? A BMI of 30 and above is considered obese.   . Maintain normal blood lipids and cholesterol levels by exercising and minimizing your intake of saturated fat. Eat a balanced diet with plenty of fruit and vegetables. Blood tests for lipids  and cholesterol should begin at age 64 and be repeated every 5 years. If your lipid or cholesterol levels are high, you are over 50, or you are at high risk for heart disease, you may need your cholesterol levels checked more frequently. Ongoing high lipid and cholesterol levels should be treated with medicines if diet and exercise are not working.  . If you smoke, find out from your health care provider how to quit. If you do not use tobacco, please do not start.  . If you choose to drink alcohol, please do not consume more than 2 drinks per day. One drink is considered to be 12 ounces  (355 mL) of beer, 5 ounces (148 mL) of wine, or 1.5 ounces (44 mL) of liquor.  . If you are 13-7 years old, ask your health care provider if you should take aspirin to prevent strokes.  . Use sunscreen. Apply sunscreen liberally and repeatedly throughout the day. You should seek shade when your shadow is shorter than you. Protect yourself by wearing long sleeves, pants, a wide-brimmed hat, and sunglasses year round, whenever you are outdoors.  . Once a month, do a whole body skin exam, using a mirror to look at the skin on your back. Tell your health care provider of new moles, moles that have irregular borders, moles that are larger than a pencil eraser, or moles that have changed in shape or color.

## 2017-01-06 NOTE — Progress Notes (Signed)
Subjective:   Annette Arnold is a 56 y.o. female who presents for an Initial Medicare Annual Wellness Visit.  Review of Systems    N/A  Cardiac Risk Factors include: dyslipidemia;smoking/ tobacco exposure     Objective:    Today's Vitals   01/06/17 1353 01/06/17 1400  BP: 98/60   Pulse: 84   Temp: 97.5 F (36.4 C)   TempSrc: Oral   Weight: 121 lb 4 oz (55 kg)   Height: 5' 1"  (1.549 m)   PainSc: 3  3   PainLoc: Neck    Body mass index is 22.91 kg/m.   Current Medications (verified) Outpatient Encounter Prescriptions as of 01/06/2017  Medication Sig  . ALPRAZolam (XANAX) 0.5 MG tablet Take 1 tablet (0.5 mg total) by mouth 2 (two) times daily as needed for anxiety.  Marland Kitchen Apremilast (OTEZLA) 30 MG TABS Take by mouth 2 (two) times daily.  . Aspirin-Salicylamide-Caffeine (BC HEADACHE POWDER PO) Take by mouth. Reported on 07/18/2016  . calcipotriene (DOVONOX) 0.005 % cream apply to affected area once daily to twice a day if needed for ps...  (REFER TO PRESCRIPTION NOTES).  . cyclobenzaprine (FLEXERIL) 5 MG tablet Take 1 tablet (5 mg total) by mouth at bedtime.  . Oxycodone HCl 10 MG TABS Take 1 tablet (10 mg total) by mouth 3 (three) times daily as needed.  . polyethylene glycol (MIRALAX / GLYCOLAX) packet Take 17 g by mouth daily.  . pravastatin (PRAVACHOL) 40 MG tablet Take 1 tablet (40 mg total) by mouth daily.  . Na Sulfate-K Sulfate-Mg Sulf (SUPREP BOWEL PREP KIT) 17.5-3.13-1.6 GM/180ML SOLN Take 1 kit by mouth as directed. (Patient not taking: Reported on 01/06/2017)   No facility-administered encounter medications on file as of 01/06/2017.     Allergies (verified) Codeine   History: Past Medical History:  Diagnosis Date  . Anxiety   . Chronic pain   . Hyperlipidemia    Past Surgical History:  Procedure Laterality Date  . ABDOMINAL HYSTERECTOMY    . APPENDECTOMY    . BREAST LUMPECTOMY    . CARPAL TUNNEL RELEASE    . CHOLECYSTECTOMY     Family History  Problem  Relation Age of Onset  . Cancer Mother   . Diabetes Mother   . Thyroid disease Mother   . Heart disease Father   . Alcohol abuse Father   . Drug abuse Sister   . Paranoid behavior Sister    Social History   Occupational History  . Not on file.   Social History Main Topics  . Smoking status: Current Every Day Smoker    Packs/day: 1.00    Years: 30.00    Types: Cigarettes  . Smokeless tobacco: Never Used  . Alcohol use No  . Drug use: No  . Sexual activity: Yes    Tobacco Counseling Ready to quit: Not Answered Counseling given: Not Answered   Activities of Daily Living In your present state of health, do you have any difficulty performing the following activities: 01/06/2017 11/13/2016  Hearing? N N  Vision? N N  Difficulty concentrating or making decisions? N N  Walking or climbing stairs? N N  Dressing or bathing? N N  Doing errands, shopping? N N  Preparing Food and eating ? N -  Using the Toilet? N -  In the past six months, have you accidently leaked urine? N -  Do you have problems with loss of bowel control? N -  Managing your Medications? N -  Managing your Finances? N -  Housekeeping or managing your Housekeeping? N -  Some recent data might be hidden    Immunizations and Health Maintenance Immunization History  Administered Date(s) Administered  . Tdap 03/22/2010   There are no preventive care reminders to display for this patient.  Patient Care Team: Roselee Nova, MD as PCP - General (Family Medicine)  Indicate any recent Medical Services you may have received from other than [facility] providers in the past year (date may be approximate).     Assessment:   This is a routine wellness examination for Annette Arnold.   Hearing/Vision screen Vision Screening Comments: Pt does not have regular vision checks and denies issues with vision.  Dietary issues and exercise activities discussed: Current Exercise Habits: Home exercise routine, Type of exercise:  walking, Time (Minutes): 15, Frequency (Times/Week): 7, Weekly Exercise (Minutes/Week): 105, Intensity: Mild  Goals    . Increase water intake          Starting 01/06/17, I will increase my water intake to 3 glasses a day.      Depression Screen PHQ 2/9 Scores 01/06/2017 11/13/2016 09/11/2016 07/18/2016 06/19/2016 05/24/2016 04/24/2016  PHQ - 2 Score 0 0 0 0 0 0 0    Fall Risk Fall Risk  01/06/2017 11/13/2016 09/11/2016 07/18/2016 06/19/2016  Falls in the past year? Yes No No No No  Number falls in past yr: 1 - - - -  Injury with Fall? No - - - -  Follow up Falls evaluation completed - - - -    Cognitive Function:     6CIT Screen 01/06/2017  What Year? 0 points  What month? 0 points  What time? 0 points  Count back from 20 0 points  Months in reverse 0 points  Repeat phrase 4 points  Total Score 4    Screening Tests Health Maintenance  Topic Date Due  . MAMMOGRAM  01/30/2017 (Originally 03/10/2016)  . INFLUENZA VACCINE  08/13/2017 (Originally 07/30/2016)  . COLONOSCOPY  12/30/2017 (Originally 09/06/2013)  . Hepatitis C Screening  12/30/2017 (Originally 1961/11/10)  . HIV Screening  12/30/2017 (Originally 09/06/1978)  . PAP SMEAR  12/30/2026 (Originally 09/06/1984)  . TETANUS/TDAP  03/22/2020      Plan:  I have personally reviewed and addressed the Medicare Annual Wellness questionnaire and have noted the following in the patient's chart:  A. Medical and social history B. Use of alcohol, tobacco or illicit drugs  C. Current medications and supplements D. Functional ability and status E.  Nutritional status F.  Physical activity G. Advance directives H. List of other physicians I.  Hospitalizations, surgeries, and ER visits in previous 12 months J.  [place] such as hearing and vision if needed, cognitive and depression L. Referrals and appointments - none  In addition, I have reviewed and discussed with patient certain preventive protocols, quality metrics, and best  practice recommendations. A written personalized care plan for preventive services as well as general preventive health recommendations were provided to patient.  See attached scanned questionnaire for additional information.   Signed,  Fabio Neighbors, LPN Nurse Health Advisor   MD Recommendations: Follow up on on mammogram screening. Pt declined colonoscopy, influenza vaccine, pap smear, Hepatitis C screening and HIV screening.   I, as supervising physician, have reviewed the nurse health advisor's Medicare Wellness Visit note for this patient and concur with the findings and recommendations listed above.  Signed Syed Asad A. Manuella Ghazi MD Attending Physician.

## 2017-01-10 ENCOUNTER — Telehealth: Payer: Self-pay | Admitting: Family Medicine

## 2017-01-13 ENCOUNTER — Ambulatory Visit (INDEPENDENT_AMBULATORY_CARE_PROVIDER_SITE_OTHER): Payer: Medicare Other | Admitting: Family Medicine

## 2017-01-13 ENCOUNTER — Telehealth: Payer: Self-pay

## 2017-01-13 ENCOUNTER — Encounter: Payer: Self-pay | Admitting: Family Medicine

## 2017-01-13 DIAGNOSIS — R102 Pelvic and perineal pain unspecified side: Secondary | ICD-10-CM

## 2017-01-13 DIAGNOSIS — F419 Anxiety disorder, unspecified: Secondary | ICD-10-CM

## 2017-01-13 DIAGNOSIS — M542 Cervicalgia: Secondary | ICD-10-CM

## 2017-01-13 DIAGNOSIS — G8929 Other chronic pain: Secondary | ICD-10-CM | POA: Diagnosis not present

## 2017-01-13 DIAGNOSIS — N951 Menopausal and female climacteric states: Secondary | ICD-10-CM

## 2017-01-13 MED ORDER — VENLAFAXINE HCL ER 37.5 MG PO CP24: 37.5000 mg | ORAL_CAPSULE | Freq: Every day | ORAL | 2 refills | Status: DC

## 2017-01-13 MED ORDER — ALPRAZOLAM 0.5 MG PO TABS: 0.5000 mg | ORAL_TABLET | Freq: Two times a day (BID) | ORAL | 0 refills | Status: DC | PRN

## 2017-01-13 MED ORDER — OXYCODONE HCL 10 MG PO TABS: 10.0000 mg | ORAL_TABLET | Freq: Three times a day (TID) | ORAL | 0 refills | Status: DC | PRN

## 2017-01-13 NOTE — Progress Notes (Signed)
Name: Annette Arnold   MRN: [id number]    DOB: 11-27-1961   Date:01/13/2017       Progress Note  Subjective  Chief Complaint  Chief Complaint  Patient presents with  . Follow-up    medication refills    Anxiety  Presents for initial visit. The problem has been unchanged. Symptoms include depressed mood, excessive worry, insomnia, nervous/anxious behavior and panic. The severity of symptoms is moderate. The quality of sleep is fair.   Past treatments include benzodiazephines. The treatment provided significant relief. Compliance with prior treatments has been good.  Neck Pain   This is a chronic problem. The problem occurs constantly. The pain is present in the midline and left side. The quality of the pain is described as aching. The pain is at a severity of 3/10. The symptoms are aggravated by bending and position (mainly change in position, can also get migraine from prolonged neck pain). She has tried oral narcotics for the symptoms. The treatment provided moderate relief.   Hot Flashes: Gradually getting worse, first stated a few months ago, waking up many times at night due to hot flashes and night sweats, then has trouble going back to sleep, she had partial hysterectomy (still has ovaries) and finds these hot flashes very troublesome.  Lower abdominal/pelvic pain: Pt. Has lower abdominal/pelvic pain, present for over a month, intermittent but worse since last week. No episodes of nausea, vomiting, difficulty urinating, constipation. SHe has history of ovarian cysts and this pain seems similar in nature to that time when she had ovarian cysts.  She is requesting an Ultrasound to evaluate and rule out any recurrence of ovarian cysts.   Past Medical History:  Diagnosis Date  . Anxiety   . Chronic pain   . Hyperlipidemia     Past Surgical History:  Procedure Laterality Date  . ABDOMINAL HYSTERECTOMY    . APPENDECTOMY    . BREAST LUMPECTOMY    . CARPAL TUNNEL RELEASE    .  CHOLECYSTECTOMY      Family History  Problem Relation Age of Onset  . Cancer Mother   . Diabetes Mother   . Thyroid disease Mother   . Heart disease Father   . Alcohol abuse Father   . Drug abuse Sister   . Paranoid behavior Sister     Social History   Social History  . Marital status: Married    Spouse name: N/A  . Number of children: N/A  . Years of education: N/A   Occupational History  . Not on file.   Social History Main Topics  . Smoking status: Current Every Day Smoker    Packs/day: 1.00    Years: 30.00    Types: Cigarettes  . Smokeless tobacco: Never Used  . Alcohol use No  . Drug use: No  . Sexual activity: Yes   Other Topics Concern  . Not on file   Social History Narrative  . No narrative on file     Current Outpatient Prescriptions:  .  ALPRAZolam (XANAX) 0.5 MG tablet, Take 1 tablet (0.5 mg total) by mouth 2 (two) times daily as needed for anxiety., Disp: 60 tablet, Rfl: 0 .  Apremilast (OTEZLA) 30 MG TABS, Take by mouth 2 (two) times daily., Disp: , Rfl:  .  Aspirin-Salicylamide-Caffeine (BC HEADACHE POWDER PO), Take by mouth. Reported on 07/18/2016, Disp: , Rfl:  .  calcipotriene (DOVONOX) 0.005 % cream, apply to affected area once daily to twice a day if needed for  ps.Marland Kitchen.  (REFER TO PRESCRIPTION NOTES)., Disp: , Rfl: 0 .  cyclobenzaprine (FLEXERIL) 5 MG tablet, Take 1 tablet (5 mg total) by mouth at bedtime., Disp: 30 tablet, Rfl: 0 .  Na Sulfate-K Sulfate-Mg Sulf (SUPREP BOWEL PREP KIT) 17.5-3.13-1.6 GM/180ML SOLN, Take 1 kit by mouth as directed., Disp: 1 Bottle, Rfl: 0 .  Oxycodone HCl 10 MG TABS, Take 1 tablet (10 mg total) by mouth 3 (three) times daily as needed., Disp: 90 tablet, Rfl: 0 .  polyethylene glycol (MIRALAX / GLYCOLAX) packet, Take 17 g by mouth daily., Disp: , Rfl:  .  pravastatin (PRAVACHOL) 40 MG tablet, Take 1 tablet (40 mg total) by mouth daily., Disp: 90 tablet, Rfl: 1  Allergies  Allergen Reactions  . Codeine Nausea And  Vomiting     Review of Systems  Musculoskeletal: Positive for neck pain.  Psychiatric/Behavioral: The patient is nervous/anxious and has insomnia.    Please see HPI for complete discussion of ROS.  Objective  Vitals:   01/13/17 0929  BP: 110/72  Pulse: 78  Resp: 16  Temp: 97.2 F (36.2 C)  TempSrc: Oral  SpO2: 96%  Weight: 118 lb 6.4 oz (53.7 kg)  Height: 5' 1"  (1.549 m)    Physical Exam  Constitutional: She is oriented to person, place, and time and well-developed, well-nourished, and in no distress.  HENT:  Head: Normocephalic and atraumatic.  Cardiovascular: Normal rate, regular rhythm and normal heart sounds.   No murmur heard. Pulmonary/Chest: Effort normal and breath sounds normal. She has no wheezes.  Abdominal: Soft. Bowel sounds are normal.  Musculoskeletal:       Right hip: She exhibits tenderness. She exhibits no swelling.       Right knee: She exhibits no swelling. Tenderness found. Lateral joint line tenderness noted.       Cervical back: She exhibits tenderness, pain and spasm.       Back:       Right upper leg: She exhibits no swelling and no edema.       Left upper leg: She exhibits no swelling and no edema.       Legs: Neurological: She is alert and oriented to person, place, and time.  Psychiatric: Mood, memory, affect and judgment normal.  Nursing note and vitals reviewed.   Assessment & Plan  1. Anxiety Stable, continue on Alprazolam as prescribed.  - ALPRAZolam (XANAX) 0.5 MG tablet; Take 1 tablet (0.5 mg total) by mouth 2 (two) times daily as needed for anxiety.  Dispense: 60 tablet; Refill: 0  2. Chronic neck pain Continue on Oxycodone 10 mg as prescribed, patient compliant with controlled substances agreement.  - Oxycodone HCl 10 MG TABS; Take 1 tablet (10 mg total) by mouth 3 (three) times daily as needed.  Dispense: 90 tablet; Refill: 0  3. Hot flashes due to menopause Start on Venlafaxine 37.51m XR daily for treatment. -  venlafaxine XR (EFFEXOR-XR) 37.5 MG 24 hr capsule; Take 1 capsule (37.5 mg total) by mouth daily with breakfast.  Dispense: 30 capsule; Refill: 2  4. Acute pelvic pain Rule out ovarian pathology, obtain pelvic Ultrasound.  - [place] Non[place]OB; Future - [place] Complete; Future   Dinero Chavira Asad A. [facility] 01/13/2017 9:37 AM

## 2017-01-13 NOTE — Telephone Encounter (Signed)
Patient was called to inform her that her appt has been scheduled for 01/20/17 @ 3pm [facility]). She was instructed to start drinking 32 ounces of water an hour before and to be finished within 30 mins without voiding.  She expressed verbally understanding and said thanks.

## 2017-01-20 ENCOUNTER — Ambulatory Visit
Admission: RE | Admit: 2017-01-20 | Discharge: 2017-01-20 | Disposition: A | Discharge status: Home / Self Care | Payer: Medicare Other | Source: Ambulatory Visit | Attending: Family Medicine | Admitting: Family Medicine

## 2017-01-20 DIAGNOSIS — R102 Pelvic and perineal pain: Secondary | ICD-10-CM

## 2017-01-30 ENCOUNTER — Ambulatory Visit
Admission: RE | Admit: 2017-01-30 | Discharge: 2017-01-30 | Disposition: A | Discharge status: Home / Self Care | Payer: Medicare Other | Source: Ambulatory Visit | Attending: Family Medicine | Admitting: Family Medicine

## 2017-01-30 DIAGNOSIS — R102 Pelvic and perineal pain: Secondary | ICD-10-CM | POA: Insufficient documentation

## 2017-01-30 DIAGNOSIS — R140 Abdominal distension (gaseous): Secondary | ICD-10-CM | POA: Diagnosis not present

## 2017-02-03 ENCOUNTER — Ambulatory Visit (INDEPENDENT_AMBULATORY_CARE_PROVIDER_SITE_OTHER): Payer: Medicare Other | Admitting: Family Medicine

## 2017-02-03 VITALS — BP 118/68 | HR 94 | Temp 98.0°F | Resp 16 | Ht 61.0 in | Wt 118.1 lb

## 2017-02-03 DIAGNOSIS — Z1211 Encounter for screening for malignant neoplasm of colon: Secondary | ICD-10-CM | POA: Diagnosis not present

## 2017-02-03 DIAGNOSIS — Z0000 Encounter for general adult medical examination without abnormal findings: Secondary | ICD-10-CM

## 2017-02-03 NOTE — Progress Notes (Signed)
Name: Annette Arnold   MRN: [id number]    DOB: 01-16-1961   Date:02/03/2017       Progress Note  Subjective  Chief Complaint  Chief Complaint  Patient presents with  . Medicare Wellness    pt has already seen [facility]    HPI  Pt. Presents for Medicare Physical part 2. She had completed the first part in January 2018.  She has been followed by Gyn in the past, had hysterectomy for excessive menstrual bleeding.  She is due for mammogram, has already been ordered.  Never had a colonoscopy.    Past Medical History:  Diagnosis Date  . Anxiety   . Chronic pain   . Hyperlipidemia     Past Surgical History:  Procedure Laterality Date  . ABDOMINAL HYSTERECTOMY    . APPENDECTOMY    . BREAST LUMPECTOMY    . CARPAL TUNNEL RELEASE    . CHOLECYSTECTOMY      Family History  Problem Relation Age of Onset  . Cancer Mother   . Diabetes Mother   . Thyroid disease Mother   . Heart disease Father   . Alcohol abuse Father   . Drug abuse Sister   . Paranoid behavior Sister     Social History   Social History  . Marital status: Married    Spouse name: N/A  . Number of children: N/A  . Years of education: N/A   Occupational History  . Not on file.   Social History Main Topics  . Smoking status: Current Every Day Smoker    Packs/day: 1.00    Years: 30.00    Types: Cigarettes  . Smokeless tobacco: Never Used  . Alcohol use No  . Drug use: No  . Sexual activity: Yes   Other Topics Concern  . Not on file   Social History Narrative  . No narrative on file     Current Outpatient Prescriptions:  .  ALPRAZolam (XANAX) 0.5 MG tablet, Take 1 tablet (0.5 mg total) by mouth 2 (two) times daily as needed for anxiety., Disp: 60 tablet, Rfl: 0 .  Apremilast (OTEZLA) 30 MG TABS, Take by mouth 2 (two) times daily., Disp: , Rfl:  .  Aspirin-Salicylamide-Caffeine (BC HEADACHE POWDER PO), Take by mouth. Reported on 07/18/2016, Disp: , Rfl:  .  calcipotriene (DOVONOX) 0.005 % cream,  apply to affected area once daily to twice a day if needed for ps...  (REFER TO PRESCRIPTION NOTES)., Disp: , Rfl: 0 .  cyclobenzaprine (FLEXERIL) 5 MG tablet, Take 1 tablet (5 mg total) by mouth at bedtime., Disp: 30 tablet, Rfl: 0 .  Na Sulfate-K Sulfate-Mg Sulf (SUPREP BOWEL PREP KIT) 17.5-3.13-1.6 GM/180ML SOLN, Take 1 kit by mouth as directed., Disp: 1 Bottle, Rfl: 0 .  Oxycodone HCl 10 MG TABS, Take 1 tablet (10 mg total) by mouth 3 (three) times daily as needed., Disp: 90 tablet, Rfl: 0 .  polyethylene glycol (MIRALAX / GLYCOLAX) packet, Take 17 g by mouth daily., Disp: , Rfl:  .  pravastatin (PRAVACHOL) 40 MG tablet, Take 1 tablet (40 mg total) by mouth daily., Disp: 90 tablet, Rfl: 1 .  venlafaxine XR (EFFEXOR-XR) 37.5 MG 24 hr capsule, Take 1 capsule (37.5 mg total) by mouth daily with breakfast., Disp: 30 capsule, Rfl: 2  Allergies  Allergen Reactions  . Codeine Nausea And Vomiting     Review of Systems  Constitutional: Negative for chills, fever, malaise/fatigue and weight loss.  HENT: Negative for congestion, ear pain and  sore throat.   Eyes: Negative for blurred vision and double vision.  Respiratory: Negative for cough and shortness of breath.   Cardiovascular: Negative for chest pain and leg swelling.  Gastrointestinal: Positive for constipation (occasional constipation. ). Negative for abdominal pain and blood in stool.  Genitourinary: Negative for hematuria.  Musculoskeletal: Positive for back pain and neck pain.  Neurological: Positive for tingling (had 1 episode of burning sensation on the inner right thigh and right side of the face. ) and headaches. Negative for dizziness.  Psychiatric/Behavioral: Negative for depression. The patient is nervous/anxious.     Objective  Vitals:   02/03/17 1358  BP: 118/68  Pulse: 94  Resp: 16  Temp: 98 F (36.7 C)  SpO2: 97%  Weight: 118 lb 2 oz (53.6 kg)  Height: 5' 1"  (1.549 m)    Physical Exam  Constitutional: She is  oriented to person, place, and time and well-developed, well-nourished, and in no distress.  HENT:  Head: Normocephalic and atraumatic.  Cardiovascular: Normal rate, regular rhythm and normal heart sounds.   No murmur heard. Pulmonary/Chest: Effort normal and breath sounds normal. She has no wheezes.  Abdominal: Soft. Bowel sounds are normal. There is no tenderness.  Neurological: She is alert and oriented to person, place, and time.  Psychiatric: Mood, memory, affect and judgment normal.  Nursing note and vitals reviewed.     Assessment & Plan  1. Well woman exam without gynecological exam Pt. has not scheduled for mammogram, will call to schedule. Asked to follow up with Gynecology for female exams and screenings.    2. Colon cancer screening  - Cologuard   Rory Xiang Asad A. [facility] 02/03/2017 2:08 PM

## 2017-02-07 ENCOUNTER — Other Ambulatory Visit: Payer: Self-pay | Admitting: Family Medicine

## 2017-02-07 DIAGNOSIS — R102 Pelvic and perineal pain unspecified side: Secondary | ICD-10-CM

## 2017-02-13 ENCOUNTER — Other Ambulatory Visit: Payer: Self-pay | Admitting: Family Medicine

## 2017-02-13 ENCOUNTER — Ambulatory Visit (INDEPENDENT_AMBULATORY_CARE_PROVIDER_SITE_OTHER): Payer: Medicare Other | Admitting: Family Medicine

## 2017-02-13 ENCOUNTER — Encounter: Payer: Self-pay | Admitting: Family Medicine

## 2017-02-13 DIAGNOSIS — M542 Cervicalgia: Secondary | ICD-10-CM | POA: Diagnosis not present

## 2017-02-13 DIAGNOSIS — F419 Anxiety disorder, unspecified: Secondary | ICD-10-CM | POA: Diagnosis not present

## 2017-02-13 DIAGNOSIS — G8929 Other chronic pain: Secondary | ICD-10-CM

## 2017-02-13 DIAGNOSIS — E785 Hyperlipidemia, unspecified: Secondary | ICD-10-CM | POA: Diagnosis not present

## 2017-02-13 LAB — LIPID PANEL
CHOL/HDL RATIO: 7.5 ratio — AB (ref <5.0)
Cholesterol: 270 mg/dL — ABNORMAL HIGH (ref <200)
HDL: 36 mg/dL — ABNORMAL LOW (ref >50)
LDL CALC: 169 mg/dL — AB (ref <100)
Triglycerides: 324 mg/dL — ABNORMAL HIGH (ref <150)
VLDL: 65 mg/dL — AB (ref <30)

## 2017-02-13 LAB — COMPLETE METABOLIC PANEL WITH GFR
ALT: 5 U/L — AB (ref 6–29)
AST: 17 U/L (ref 10–35)
Albumin: 4 g/dL (ref 3.6–5.1)
Alkaline Phosphatase: 99 U/L (ref 33–130)
BUN: 9 mg/dL (ref 7–25)
CHLORIDE: 106 mmol/L (ref 98–110)
CO2: 23 mmol/L (ref 20–31)
CREATININE: 0.68 mg/dL (ref 0.50–1.05)
Calcium: 9.5 mg/dL (ref 8.6–10.4)
GFR, Est African American: >89 mL/min (ref >=60)
GFR, Est Non African American: >89 mL/min (ref >=60)
GLUCOSE: 99 mg/dL (ref 65–99)
Potassium: 4.2 mmol/L (ref 3.5–5.3)
SODIUM: 138 mmol/L (ref 135–146)
Total Bilirubin: 0.4 mg/dL (ref 0.2–1.2)
Total Protein: 7.6 g/dL (ref 6.1–8.1)

## 2017-02-13 MED ORDER — OXYCODONE HCL 10 MG PO TABS: 10.0000 mg | ORAL_TABLET | Freq: Three times a day (TID) | ORAL | 0 refills | Status: DC | PRN

## 2017-02-13 MED ORDER — ALPRAZOLAM 0.5 MG PO TABS: 0.5000 mg | ORAL_TABLET | Freq: Two times a day (BID) | ORAL | 0 refills | Status: DC | PRN

## 2017-02-13 NOTE — Progress Notes (Signed)
Name: Annette Arnold   MRN: [id number]    DOB: [birth year]   Date:02/13/2017       Progress Note  Subjective  Chief Complaint  Chief Complaint  Patient presents with  . Follow-up    1 mo  . Medication Refill    Anxiety  Presents for initial visit. The problem has been unchanged. Symptoms include excessive worry, insomnia, nervous/anxious behavior and panic. Patient reports no depressed mood. The severity of symptoms is moderate. The quality of sleep is fair.   Her past medical history is significant for anxiety/panic attacks. Past treatments include benzodiazephines. The treatment provided significant relief. Compliance with prior treatments has been good.  Neck Pain   This is a chronic problem. The problem occurs constantly. The pain is present in the midline and left side. The quality of the pain is described as aching. The pain is at a severity of 3/10. The symptoms are aggravated by bending and position (mainly change in position, also gets a headache from prolonged neck pain.). Pertinent negatives include no visual change or weakness. She has tried oral narcotics for the symptoms. The treatment provided moderate relief.     Past Medical History:  Diagnosis Date  . Anxiety   . Chronic pain   . Hyperlipidemia     Past Surgical History:  Procedure Laterality Date  . ABDOMINAL HYSTERECTOMY    . APPENDECTOMY    . BREAST LUMPECTOMY    . CARPAL TUNNEL RELEASE    . CHOLECYSTECTOMY      Family History  Problem Relation Age of Onset  . Cancer Mother   . Diabetes Mother   . Thyroid disease Mother   . Heart disease Father   . Alcohol abuse Father   . Drug abuse Sister   . Paranoid behavior Sister     Social History   Social History  . Marital status: Married    Spouse name: N/A  . Number of children: N/A  . Years of education: N/A   Occupational History  . Not on file.   Social History Main Topics  . Smoking status: Current Every Day Smoker    Packs/day: 1.00   Years: 30.00    Types: Cigarettes  . Smokeless tobacco: Never Used  . Alcohol use No  . Drug use: No  . Sexual activity: Yes   Other Topics Concern  . Not on file   Social History Narrative  . No narrative on file     Current Outpatient Prescriptions:  .  ALPRAZolam (XANAX) 0.5 MG tablet, Take 1 tablet (0.5 mg total) by mouth 2 (two) times daily as needed for anxiety., Disp: 60 tablet, Rfl: 0 .  Apremilast (OTEZLA) 30 MG TABS, Take by mouth 2 (two) times daily., Disp: , Rfl:  .  Aspirin-Salicylamide-Caffeine (BC HEADACHE POWDER PO), Take by mouth. Reported on 07/18/2016, Disp: , Rfl:  .  calcipotriene (DOVONOX) 0.005 % cream, apply to affected area once daily to twice a day if needed for ps...  (REFER TO PRESCRIPTION NOTES)., Disp: , Rfl: 0 .  cyclobenzaprine (FLEXERIL) 5 MG tablet, Take 1 tablet (5 mg total) by mouth at bedtime., Disp: 30 tablet, Rfl: 0 .  Na Sulfate-K Sulfate-Mg Sulf (SUPREP BOWEL PREP KIT) 17.5-3.13-1.6 GM/180ML SOLN, Take 1 kit by mouth as directed., Disp: 1 Bottle, Rfl: 0 .  Oxycodone HCl 10 MG TABS, Take 1 tablet (10 mg total) by mouth 3 (three) times daily as needed., Disp: 90 tablet, Rfl: 0 .  polyethylene glycol (MIRALAX /  GLYCOLAX) packet, Take 17 g by mouth daily., Disp: , Rfl:  .  pravastatin (PRAVACHOL) 40 MG tablet, Take 1 tablet (40 mg total) by mouth daily., Disp: 90 tablet, Rfl: 1 .  venlafaxine XR (EFFEXOR-XR) 37.5 MG 24 hr capsule, Take 1 capsule (37.5 mg total) by mouth daily with breakfast., Disp: 30 capsule, Rfl: 2  Allergies  Allergen Reactions  . Codeine Nausea And Vomiting     Review of Systems  Neurological: Negative for weakness.  Psychiatric/Behavioral: The patient is nervous/anxious and has insomnia.      Objective  Vitals:   02/13/17 0930  BP: 120/68  Pulse: (!) 111  Resp: 17  Temp: 97.6 F (36.4 C)  TempSrc: Oral  SpO2: 98%  Weight: 119 lb 8 oz (54.2 kg)  Height: 5' 1"  (1.549 m)    Physical Exam  Constitutional: She  is oriented to person, place, and time and well-developed, well-nourished, and in no distress.  HENT:  Head: Normocephalic and atraumatic.  Cardiovascular: Normal rate, regular rhythm and normal heart sounds.   No murmur heard. Pulmonary/Chest: Effort normal and breath sounds normal. She has no wheezes.  Abdominal: Soft. Bowel sounds are normal.  Musculoskeletal:       Right knee: She exhibits no swelling. Tenderness found. Lateral joint line tenderness noted.       Cervical back: She exhibits tenderness, pain and spasm.       Back:       Right upper leg: She exhibits no swelling and no edema.       Left upper leg: She exhibits no swelling and no edema.  Neurological: She is alert and oriented to person, place, and time.  Psychiatric: Mood, memory, affect and judgment normal.  Nursing note and vitals reviewed.   Assessment & Plan  1. Anxiety Stable on alprazolam taken twice daily when needed. Refills provided - ALPRAZolam (XANAX) 0.5 MG tablet; Take 1 tablet (0.5 mg total) by mouth 2 (two) times daily as needed for anxiety.  Dispense: 60 tablet; Refill: 0  2. Chronic neck pain Responsive to opioid therapy, patient compliant with controlled substances agreement. Understands the dependence potential, side effects, and drug interactions of opioids. Refills provided and follow-up in one month - Oxycodone HCl 10 MG TABS; Take 1 tablet (10 mg total) by mouth 3 (three) times daily as needed.  Dispense: 90 tablet; Refill: 0   Avilyn Virtue Asad A. [facility] 02/13/2017 6:09 PM

## 2017-02-17 ENCOUNTER — Ambulatory Visit: Admission: RE | Admit: 2017-02-17 | Payer: Medicare Other | Source: Ambulatory Visit

## 2017-02-19 ENCOUNTER — Ambulatory Visit: Admission: RE | Admit: 2017-02-19 | Payer: Medicare Other | Source: Ambulatory Visit

## 2017-02-24 ENCOUNTER — Ambulatory Visit
Admission: RE | Admit: 2017-02-24 | Discharge: 2017-02-24 | Disposition: A | Discharge status: Home / Self Care | Payer: Medicare Other | Source: Ambulatory Visit | Attending: Family Medicine | Admitting: Family Medicine

## 2017-02-24 DIAGNOSIS — I700 Atherosclerosis of aorta: Secondary | ICD-10-CM | POA: Diagnosis not present

## 2017-02-24 DIAGNOSIS — Z9049 Acquired absence of other specified parts of digestive tract: Secondary | ICD-10-CM | POA: Diagnosis not present

## 2017-02-24 DIAGNOSIS — N2889 Other specified disorders of kidney and ureter: Secondary | ICD-10-CM | POA: Diagnosis not present

## 2017-02-24 DIAGNOSIS — R102 Pelvic and perineal pain unspecified side: Secondary | ICD-10-CM

## 2017-02-24 MED ORDER — IOPAMIDOL (ISOVUE-300) INJECTION 61%
100.0000 mL | Freq: Once | INTRAVENOUS | Status: AC | PRN
  Administered 2017-02-24: 100 mL via INTRAVENOUS

## 2017-02-26 ENCOUNTER — Other Ambulatory Visit: Payer: Self-pay

## 2017-02-26 ENCOUNTER — Telehealth: Payer: Self-pay | Admitting: Family Medicine

## 2017-02-26 ENCOUNTER — Other Ambulatory Visit: Payer: Self-pay | Admitting: Family Medicine

## 2017-02-26 DIAGNOSIS — N2889 Other specified disorders of kidney and ureter: Secondary | ICD-10-CM

## 2017-02-26 DIAGNOSIS — K838 Other specified diseases of biliary tract: Secondary | ICD-10-CM

## 2017-02-26 NOTE — Telephone Encounter (Signed)
Pt states she would like to be referred to [facility] across the [address] from [place] to see Dr Devin Going. Please verify that this can be done.

## 2017-02-26 NOTE — Progress Notes (Signed)
Referral clerk notified. Appointment made ASAP

## 2017-03-02 NOTE — Telephone Encounter (Signed)
Referral to nephrology can certainly be ordered but patient has a mass on her right kidney thought to be renal carcinoma. She will need to be seen by urology for management.

## 2017-03-06 ENCOUNTER — Ambulatory Visit
Admission: RE | Admit: 2017-03-06 | Discharge: 2017-03-06 | Disposition: A | Discharge status: Home / Self Care | Payer: Medicare Other | Source: Ambulatory Visit | Attending: Urology | Admitting: Urology

## 2017-03-06 ENCOUNTER — Encounter: Payer: Self-pay | Admitting: Urology

## 2017-03-06 ENCOUNTER — Ambulatory Visit (INDEPENDENT_AMBULATORY_CARE_PROVIDER_SITE_OTHER): Payer: Medicare Other | Admitting: Urology

## 2017-03-06 VITALS — BP 113/71 | HR 112 | Ht 61.0 in | Wt 121.0 lb

## 2017-03-06 DIAGNOSIS — I700 Atherosclerosis of aorta: Secondary | ICD-10-CM | POA: Insufficient documentation

## 2017-03-06 DIAGNOSIS — N2889 Other specified disorders of kidney and ureter: Secondary | ICD-10-CM | POA: Insufficient documentation

## 2017-03-06 DIAGNOSIS — R918 Other nonspecific abnormal finding of lung field: Secondary | ICD-10-CM | POA: Diagnosis not present

## 2017-03-06 NOTE — Progress Notes (Signed)
 03/06/2017 10:26 AM   Annette Arnold 04/19/1961 [id number]  Referring provider: Syed Asad A Shah, MD [address] [place], [place] [address]  Chief Complaint  Patient presents with  . New Patient (Initial Visit)    New Patient    HPI:  55-year-old female who presents for further evaluation of an incidental right renal mass. She initially presented to her primary care physician complaining of lower abdominal/pelvic pain. She underwent pelvic ultrasound, however visualization was poor due to excessive bowel gas. As such, CT abdomen pelvis with contrast was ordered.  CT abdomen and pelvis demonstrated a 6.0 x 5.5 x 4.6 enhancing right posterior upper pole renal mass highly concerning for renal cell carcinoma. There is no associated lymphadenopathy, evidence of metastatic disease, or renal vein involvement.  She denies a family history of renal cell carcinoma.  She denies gross hematuria. No recent weight loss.  She does have some mild right flank pain as well as right lower back pain and bilateral pelvic pain.    Previous abdominal surgeries include laparoscopic cholecystectomy ,open appendectomy, lap tubal ligation and vaginal hysterectomy.  No recent chest imaging.  Normal LFT/ alk phos on 02/13/2017.   PMH: Past Medical History:  Diagnosis Date  . Anxiety   . Chronic pain   . Hyperlipidemia     Surgical History: Past Surgical History:  Procedure Laterality Date  . ABDOMINAL HYSTERECTOMY    . APPENDECTOMY    . BREAST LUMPECTOMY    . CARPAL TUNNEL RELEASE    . CHOLECYSTECTOMY      Home Medications:  Allergies as of 03/06/2017      Reactions   Codeine Nausea And Vomiting      Medication List       Accurate as of 03/06/17 11:59 PM. Always use your most recent med list.          ALPRAZolam 0.5 MG tablet Commonly known as:  XANAX Take 1 tablet (0.5 mg total) by mouth 2 (two) times daily as needed for anxiety.   BC HEADACHE POWDER PO Take by  mouth. Reported on 07/18/2016   calcipotriene 0.005 % cream Commonly known as:  DOVONOX apply to affected area once daily to twice a day if needed for ps...  (REFER TO PRESCRIPTION NOTES).   cyclobenzaprine 5 MG tablet Commonly known as:  FLEXERIL Take 1 tablet (5 mg total) by mouth at bedtime.   Oxycodone HCl 10 MG Tabs Take 1 tablet (10 mg total) by mouth 3 (three) times daily as needed.   polyethylene glycol packet Commonly known as:  MIRALAX / GLYCOLAX Take 17 g by mouth daily.   pravastatin 40 MG tablet Commonly known as:  PRAVACHOL Take 1 tablet (40 mg total) by mouth daily.   venlafaxine XR 37.5 MG 24 hr capsule Commonly known as:  EFFEXOR-XR Take 1 capsule (37.5 mg total) by mouth daily with breakfast.       Allergies:  Allergies  Allergen Reactions  . Codeine Nausea And Vomiting    Family History: Family History  Problem Relation Age of Onset  . Cancer Mother   . Diabetes Mother   . Thyroid disease Mother   . Heart disease Father   . Alcohol abuse Father   . Drug abuse Sister   . Paranoid behavior Sister   . Prostate cancer Neg Hx   . Kidney cancer Neg Hx     Social History:  reports that she has been smoking Cigarettes.  She has a 30.00   pack-year smoking history. She has never used smokeless tobacco. She reports that she does not drink alcohol or use drugs.  ROS: UROLOGY Frequent Urination?: No Hard to postpone urination?: No Burning/pain with urination?: No Get up at night to urinate?: Yes Leakage of urine?: No Urine stream starts and stops?: No Trouble starting stream?: No Do you have to strain to urinate?: No Blood in urine?: No Urinary tract infection?: No Sexually transmitted disease?: No Injury to kidneys or bladder?: No Painful intercourse?: No Weak stream?: No Currently pregnant?: No Vaginal bleeding?: No Last menstrual period?: n  Gastrointestinal Nausea?: No Vomiting?: No Indigestion/heartburn?: No Diarrhea?:  No Constipation?: Yes  Constitutional Fever: No Night sweats?: Yes Weight loss?: No Fatigue?: No  Skin Skin rash/lesions?: Yes Itching?: Yes  Eyes Blurred vision?: No Double vision?: No  Ears/Nose/Throat Sore throat?: No Sinus problems?: No  Hematologic/Lymphatic Swollen glands?: No Easy bruising?: No  Cardiovascular Leg swelling?: No Chest pain?: Yes  Respiratory Cough?: No Shortness of breath?: No  Endocrine Excessive thirst?: No  Musculoskeletal Back pain?: Yes Joint pain?: Yes  Neurological Headaches?: Yes Dizziness?: No  Psychologic Depression?: No Anxiety?: Yes  Physical Exam: BP 113/71   Pulse (!) 112   Ht 5' 1" (1.549 m)   Wt 121 lb (54.9 kg)   BMI 22.86 kg/m   Constitutional:  Alert and oriented, No acute distress.  Accompanied by daughter and boyfriend today. HEENT: [place] AT, moist mucus membranes.  Trachea midline, no masses. Cardiovascular: No clubbing, cyanosis, or edema. Respiratory: Normal respiratory effort, no increased work of breathing. GI: Abdomen is soft, nontender, nondistended, no abdominal masses.  Multiple previous laparoscopic incision sites including umbilicus appreciated. Open left lower quadrant scar appreciated. GU: No CVA tenderness.  Skin: No rashes, bruises or suspicious lesions. Lymph: No cervical or inguinal adenopathy. Neurologic: Grossly intact, no focal deficits, moving all 4 extremities. Psychiatric: Anxious and tearful at times.  Laboratory Data: Lab Results  Component Value Date   WBC 11.1 (H) 01/09/2013   HGB 13.4 01/09/2013   HCT 39.6 01/09/2013   MCV 89 01/09/2013   PLT 229 01/09/2013    Lab Results  Component Value Date   CREATININE 0.68 02/13/2017    Hepatic Function Latest Ref Rng & Units 02/13/2017 03/07/2016  Total Protein 6.1 - 8.1 g/dL 7.6 7.4  Albumin 3.6 - 5.1 g/dL 4.0 4.3  AST 10 - 35 U/L 17 14  ALT 6 - 29 U/L 5(L) 3  Alk Phosphatase 33 - 130 U/L 99 122(H)  Total Bilirubin 0.2 - 1.2  mg/dL 0.4 0.2    Urinalysis No results found for: COLORURINE, APPEARANCEUR, LABSPEC, PHURINE, GLUCOSEU, HGBUR, BILIRUBINUR, KETONESUR, PROTEINUR, UROBILINOGEN, NITRITE, LEUKOCYTESUR  Pertinent Imaging: CLINICAL DATA:  Intermittent pelvic pain and bloating for 4 months.  EXAM: CT ABDOMEN AND PELVIS WITH CONTRAST  TECHNIQUE: Multidetector CT imaging of the abdomen and pelvis was performed using the standard protocol following bolus administration of intravenous contrast.  CONTRAST:  114m ISOVUE-300 IOPAMIDOL (ISOVUE-300) INJECTION 61%  COMPARISON:  Ultrasound of January 30, 2017.  FINDINGS: Lower chest: No acute abnormality.  Hepatobiliary: Status post cholecystectomy. No focal abnormality is seen in the liver. Common bile duct measures 15 mm without obstructing calculus.  Pancreas: Unremarkable. No pancreatic ductal dilatation or surrounding inflammatory changes.  Spleen: Normal in size without focal abnormality.  Adrenals/Urinary Tract: Adrenal glands appear normal. Left kidney and ureter appear normal. 6.0 x 5.5 x 4.6 cm enhancing mass is seen arising from upper pole of right kidney consistent with renal  cell carcinoma. No hydronephrosis or renal obstruction is noted. Urinary bladder is unremarkable.  Stomach/Bowel: There is no evidence of bowel obstruction.  Vascular/Lymphatic: Aortic atherosclerosis. No enlarged abdominal or pelvic lymph nodes.  Reproductive: Status post hysterectomy.  Ovaries are unremarkable.  Other: No abdominal wall hernia or abnormality. No abdominopelvic ascites.  Musculoskeletal: No acute or significant osseous findings.  IMPRESSION: 6 cm enhancing mass is seen arising from upper pole of right kidney consistent with renal cell carcinoma.  Status post cholecystectomy. Common bile duct is significantly dilated at 15 mm. This may be due to post cholecystectomy status, but clinical correlation is recommended to rule out  distal common bile duct obstruction.  Aortic atherosclerosis.  These results will be called to the ordering clinician or representative by the Radiologist Assistant, and communication documented in the PACS or zVision Dashboard.   Electronically Signed   By: James  Green Jr, M.D.   On: 02/24/2017 15:56  CT scan personally reviewed today with the patient.  Assessment & Plan:    1. Right renal mass A solid renal mass raises the suspicion of primary renal malignancy.  We discussed this in detail and in regards to the spectrum of renal masses which includes cysts (pure cysts are considered benign), solid masses and everything in between. The risk of metastasis increases as the size of solid renal mass increases. In general, it is believed that the risk of metastasis for renal masses less than 3-4 cm is small (up to approximately 5%) based mainly on large retrospective studies.  There is no evidence of metastatic disease. Chest x-ray ordered today for completion of metastatic workup.  In some cases and especially in patients of older age and multiple comorbidities a surveillance approach may be appropriate. The treatment of solid renal masses includes: surveillance, cryoablation (percutaneous and laparoscopic) in addition to partial and complete nephrectomy (each with option of laparoscopic, robotic and open depending on appropriateness). Furthermore, nephrectomy appears to be an independent risk factor for the development of chronic kidney disease suggesting that nephron sparing approaches should be implored whenever feasible. We reviewed these options in context of the patients current situation as well as the pros and cons of each.  On careful review of these images, I do feel that this lesion is amenable to partial nephrectomy.  The lesion does appear to involve the collecting system but is peripheral. I recommend proceeding with robotic partial nephrectomy. Alternatives including  laparoscopic radical nephrectomy was also discussed, however, would increase her risk for progression to end-stage renal disease and as such, would recommend against this procedure. Risks of the procedure were reviewed today in detail. These include risk of need for conversion to open procedure, conversion to radical nephrectomy, bleeding, infection, damage to surrounding structures, urine leak, delayed bleeding, AV malformation, pseudoaneurysm, development of CKD, amongst others. The postoperative course including need for drain and Foley catheter were discussed along with typical hospital admission for 1-2 days postop.  All her questions were answered in detail. She is interested in proceeding with robotic partial nephrectomy would like a second opinion at [facility]. Referral was placed today.   Her boyfriend has a history of renal transplant and is followed by nephrology here in [place]. She is also requesting a referral to Dr. Lateef. I explained that at this point, her renal function is normal but she would like to see him preoperatively.  She will call us and let us know should like to proceed with surgery here at .  Orders Placed This   Encounter  Procedures  . Chest 1 View    Standing Status:   Future    Number of Occurrences:   1    Standing Expiration Date:   05/06/2018    Order Specific Question:   Reason for Exam (SYMPTOM  OR DIAGNOSIS REQUIRED)    Answer:   renal mass, staging    Order Specific Question:   Is patient pregnant?    Answer:   No    Order Specific Question:   Preferred imaging location?    Answer:   [facility]  . Ambulatory referral to Urology    Referral Priority:   Routine    Referral Type:   Consultation    Referral Reason:   Specialty Services Required    Requested Specialty:   Urology    Number of Visits Requested:   1  . Ambulatory referral to Nephrology    Referral Priority:   Routine    Referral Type:   Consultation    Referral Reason:    Specialty Services Required    Requested Specialty:   Nephrology    Number of Visits Requested:   1     Return if symptoms worsen or fail to improve.  Hollice Espy, MD  [facility] [address] [place], [place] [address] [phone]  I spent 45 min with this patient of which greater than 50% was spent in counseling and coordination of care with the patient.

## 2017-03-07 ENCOUNTER — Telehealth: Payer: Self-pay

## 2017-03-07 NOTE — Telephone Encounter (Signed)
Spoke with pt in reference to cxr results. Pt voiced understanding.

## 2017-03-07 NOTE — Telephone Encounter (Signed)
-----   Message from Hollice Espy, MD sent at 03/06/2017  2:32 PM EST ----- Please let this patient know that her chest x-ray was perfectly normal. No evidence of metastatic disease.

## 2017-03-08 ENCOUNTER — Other Ambulatory Visit: Payer: Self-pay | Admitting: Family Medicine

## 2017-03-08 DIAGNOSIS — E785 Hyperlipidemia, unspecified: Secondary | ICD-10-CM

## 2017-03-11 ENCOUNTER — Ambulatory Visit (INDEPENDENT_AMBULATORY_CARE_PROVIDER_SITE_OTHER): Payer: Medicare Other | Admitting: Family Medicine

## 2017-03-11 ENCOUNTER — Encounter: Payer: Self-pay | Admitting: Family Medicine

## 2017-03-11 VITALS — BP 120/72 | HR 87 | Temp 98.0°F | Resp 16 | Ht 61.0 in | Wt 122.5 lb

## 2017-03-11 DIAGNOSIS — G8929 Other chronic pain: Secondary | ICD-10-CM

## 2017-03-11 DIAGNOSIS — F419 Anxiety disorder, unspecified: Secondary | ICD-10-CM | POA: Diagnosis not present

## 2017-03-11 DIAGNOSIS — N2889 Other specified disorders of kidney and ureter: Secondary | ICD-10-CM

## 2017-03-11 DIAGNOSIS — M542 Cervicalgia: Secondary | ICD-10-CM

## 2017-03-11 MED ORDER — ALPRAZOLAM 0.5 MG PO TABS: 0.5000 mg | ORAL_TABLET | Freq: Two times a day (BID) | ORAL | 0 refills | Status: DC | PRN

## 2017-03-11 MED ORDER — OXYCODONE HCL 10 MG PO TABS: 10.0000 mg | ORAL_TABLET | Freq: Three times a day (TID) | ORAL | 0 refills | Status: DC | PRN

## 2017-03-11 NOTE — Progress Notes (Signed)
Name: Annette Arnold   MRN: [id number]    DOB: [birth year]   Date:03/11/2017       Progress Note  Subjective  Chief Complaint  Chief Complaint  Patient presents with  . Follow-up    1 month recheck    Anxiety  Presents for follow-up visit. The problem has been unchanged. Symptoms include excessive worry, insomnia, malaise, nervous/anxious behavior and panic. Patient reports no depressed mood. The severity of symptoms is moderate and causing significant distress. The quality of sleep is fair.   Her past medical history is significant for anxiety/panic attacks. Past treatments include benzodiazephines. The treatment provided significant relief. Compliance with prior treatments has been good.  Neck Pain   This is a chronic problem. The problem occurs constantly. The pain is present in the midline and left side. The quality of the pain is described as aching. The pain is at a severity of 2/10. The symptoms are aggravated by bending and position (mainly change in position, also gets a headache from prolonged neck pain.). Pertinent negatives include no visual change or weakness. She has tried oral narcotics for the symptoms. The treatment provided moderate relief.     Past Medical History:  Diagnosis Date  . Anxiety   . Chronic pain   . Hyperlipidemia     Past Surgical History:  Procedure Laterality Date  . ABDOMINAL HYSTERECTOMY    . APPENDECTOMY    . BREAST LUMPECTOMY    . CARPAL TUNNEL RELEASE    . CHOLECYSTECTOMY      Family History  Problem Relation Age of Onset  . Cancer Mother   . Diabetes Mother   . Thyroid disease Mother   . Heart disease Father   . Alcohol abuse Father   . Drug abuse Sister   . Paranoid behavior Sister   . Prostate cancer Neg Hx   . Kidney cancer Neg Hx     Social History   Social History  . Marital status: Married    Spouse name: N/A  . Number of children: N/A  . Years of education: N/A   Occupational History  . Not on file.   Social  History Main Topics  . Smoking status: Current Every Day Smoker    Packs/day: 1.00    Years: 30.00    Types: Cigarettes  . Smokeless tobacco: Never Used  . Alcohol use No  . Drug use: No  . Sexual activity: Yes   Other Topics Concern  . Not on file   Social History Narrative  . No narrative on file     Current Outpatient Prescriptions:  .  ALPRAZolam (XANAX) 0.5 MG tablet, Take 1 tablet (0.5 mg total) by mouth 2 (two) times daily as needed for anxiety., Disp: 60 tablet, Rfl: 0 .  Aspirin-Salicylamide-Caffeine (BC HEADACHE POWDER PO), Take by mouth. Reported on 07/18/2016, Disp: , Rfl:  .  calcipotriene (DOVONOX) 0.005 % cream, apply to affected area once daily to twice a day if needed for ps...  (REFER TO PRESCRIPTION NOTES)., Disp: , Rfl: 0 .  cyclobenzaprine (FLEXERIL) 5 MG tablet, Take 1 tablet (5 mg total) by mouth at bedtime., Disp: 30 tablet, Rfl: 0 .  Oxycodone HCl 10 MG TABS, Take 1 tablet (10 mg total) by mouth 3 (three) times daily as needed., Disp: 90 tablet, Rfl: 0 .  polyethylene glycol (MIRALAX / GLYCOLAX) packet, Take 17 g by mouth daily., Disp: , Rfl:  .  pravastatin (PRAVACHOL) 40 MG tablet, Take 1 tablet (40 mg  total) by mouth daily., Disp: 90 tablet, Rfl: 1 .  venlafaxine XR (EFFEXOR-XR) 37.5 MG 24 hr capsule, Take 1 capsule (37.5 mg total) by mouth daily with breakfast., Disp: 30 capsule, Rfl: 2  Allergies  Allergen Reactions  . Codeine Nausea And Vomiting     Review of Systems  Musculoskeletal: Positive for neck pain.  Neurological: Negative for weakness.  Psychiatric/Behavioral: The patient is nervous/anxious and has insomnia.       Objective  Vitals:   03/11/17 1109  BP: 120/72  Pulse: 87  Resp: 16  Temp: 98 F (36.7 C)  TempSrc: Oral  SpO2: 94%  Weight: 122 lb 8 oz (55.6 kg)  Height: 5\' 1"  (1.549 m)    Physical Exam  Constitutional: She is oriented to person, place, and time and well-developed, well-nourished, and in no distress.    HENT:  Head: Normocephalic and atraumatic.  Cardiovascular: Normal rate, regular rhythm and normal heart sounds.   No murmur heard. Pulmonary/Chest: Effort normal and breath sounds normal. She has no wheezes.  Abdominal: Soft. Bowel sounds are normal.  Musculoskeletal:       Cervical back: She exhibits tenderness, pain and spasm.       Back:       Right upper leg: She exhibits no swelling and no edema.       Left upper leg: She exhibits no swelling and no edema.  Neurological: She is alert and oriented to person, place, and time.  Psychiatric: Mood, memory, affect and judgment normal.  Nursing note and vitals reviewed.     Assessment & Plan  1. Anxiety Stable, responsive to alprazolam taken twice daily as needed, refills provided. Follow-up in one month - ALPRAZolam (XANAX) 0.5 MG tablet; Take 1 tablet (0.5 mg total) by mouth 2 (two) times daily as needed for anxiety.  Dispense: 60 tablet; Refill: 0  2. Chronic neck pain Stable and responsive to chronic opioid therapy, patient compliant with controlled substances agreement and understands the dependence potential, side effects and drug interactions of opioids. Refills provided and follow-up in one month - Oxycodone HCl 10 MG TABS; Take 1 tablet (10 mg total) by mouth 3 (three) times daily as needed.  Dispense: 90 tablet; Refill: 0  3. Renal mass, right This has been read as renal carcinoma by the radiologist, patient has seen urology and wishes to follow and continue care at [facility],   [place]. [facility] 03/11/2017 11:56 AM

## 2017-03-13 ENCOUNTER — Ambulatory Visit: Payer: Self-pay | Admitting: Family Medicine

## 2017-03-24 ENCOUNTER — Ambulatory Visit: Payer: Medicare Other | Admitting: Gastroenterology

## 2017-04-07 ENCOUNTER — Other Ambulatory Visit: Payer: Self-pay | Admitting: Family Medicine

## 2017-04-08 ENCOUNTER — Telehealth: Payer: Self-pay | Admitting: Emergency Medicine

## 2017-04-08 NOTE — Telephone Encounter (Signed)
The prescription request was denied because the patch was prescribed over 6 months ago and she needs an appointment to evaluate and get a new prescription

## 2017-04-08 NOTE — Telephone Encounter (Signed)
Would like to know why pain patch was denied at pharmacy. Had a few left and this is what has been helping her.

## 2017-04-09 NOTE — Telephone Encounter (Signed)
Patient has appointment on Friday, will discuss at that time

## 2017-04-10 DIAGNOSIS — N2889 Other specified disorders of kidney and ureter: Secondary | ICD-10-CM | POA: Diagnosis not present

## 2017-04-11 ENCOUNTER — Ambulatory Visit (INDEPENDENT_AMBULATORY_CARE_PROVIDER_SITE_OTHER): Payer: Medicare Other | Admitting: Family Medicine

## 2017-04-11 ENCOUNTER — Encounter: Payer: Self-pay | Admitting: Family Medicine

## 2017-04-11 VITALS — BP 120/71 | HR 79 | Temp 98.0°F | Resp 16 | Ht 61.0 in | Wt 122.3 lb

## 2017-04-11 DIAGNOSIS — M5441 Lumbago with sciatica, right side: Secondary | ICD-10-CM

## 2017-04-11 DIAGNOSIS — M542 Cervicalgia: Secondary | ICD-10-CM

## 2017-04-11 DIAGNOSIS — G8929 Other chronic pain: Secondary | ICD-10-CM

## 2017-04-11 DIAGNOSIS — F419 Anxiety disorder, unspecified: Secondary | ICD-10-CM | POA: Diagnosis not present

## 2017-04-11 MED ORDER — ALPRAZOLAM 0.5 MG PO TABS: 0.5000 mg | ORAL_TABLET | Freq: Two times a day (BID) | ORAL | 0 refills | Status: DC | PRN

## 2017-04-11 MED ORDER — OXYCODONE HCL 10 MG PO TABS: 10.0000 mg | ORAL_TABLET | Freq: Three times a day (TID) | ORAL | 0 refills | Status: DC | PRN

## 2017-04-11 MED ORDER — LIDOCAINE 5 % EX PTCH: 1.0000 | MEDICATED_PATCH | CUTANEOUS | 0 refills | Status: DC

## 2017-04-11 NOTE — Progress Notes (Signed)
Name: Annette Arnold   MRN: [id number]    DOB: [birth year]   Date:04/11/2017       Progress Note  Subjective  Chief Complaint  Chief Complaint  Patient presents with  . Follow-up    1 mo  . Medication Refill    Anxiety  Presents for follow-up visit. The problem has been unchanged. Symptoms include excessive worry, insomnia, malaise, nervous/anxious behavior and panic. Patient reports no depressed mood or shortness of breath. Primary symptoms comment: recently worse becasue of new diagnosis of renal mass. The severity of symptoms is moderate and causing significant distress. The quality of sleep is fair.   Her past medical history is significant for anxiety/panic attacks. Past treatments include benzodiazephines. The treatment provided significant relief. Compliance with prior treatments has been good.  Neck Pain   This is a chronic problem. The problem occurs constantly. The pain is present in the midline and left side. The quality of the pain is described as aching. The pain is at a severity of 3/10. The symptoms are aggravated by bending and position (mainly change in position, also gets a headache from prolonged neck pain.). Stiffness is present in the morning. Pertinent negatives include no leg pain, numbness, visual change or weakness. She has tried oral narcotics for the symptoms. The treatment provided moderate relief.  Back Pain  This is a chronic problem. The pain is present in the gluteal and lumbar spine. The quality of the pain is described as aching. The pain is at a severity of 3/10. The pain is moderate. The symptoms are aggravated by bending, position and standing. Pertinent negatives include no bladder incontinence, bowel incontinence, leg pain, numbness or weakness. She has tried analgesics (has been using Lidoderm patches for relief) for the symptoms.     Past Medical History:  Diagnosis Date  . Anxiety   . Chronic pain   . Hyperlipidemia     Past Surgical History:    Procedure Laterality Date  . ABDOMINAL HYSTERECTOMY    . APPENDECTOMY    . BREAST LUMPECTOMY    . CARPAL TUNNEL RELEASE    . CHOLECYSTECTOMY      Family History  Problem Relation Age of Onset  . Cancer Mother   . Diabetes Mother   . Thyroid disease Mother   . Heart disease Father   . Alcohol abuse Father   . Drug abuse Sister   . Paranoid behavior Sister   . Prostate cancer Neg Hx   . Kidney cancer Neg Hx     Social History   Social History  . Marital status: Married    Spouse name: N/A  . Number of children: N/A  . Years of education: N/A   Occupational History  . Not on file.   Social History Main Topics  . Smoking status: Current Every Day Smoker    Packs/day: 1.00    Years: 30.00    Types: Cigarettes  . Smokeless tobacco: Never Used  . Alcohol use No  . Drug use: No  . Sexual activity: Yes   Other Topics Concern  . Not on file   Social History Narrative  . No narrative on file     Current Outpatient Prescriptions:  .  ALPRAZolam (XANAX) 0.5 MG tablet, Take 1 tablet (0.5 mg total) by mouth 2 (two) times daily as needed for anxiety., Disp: 60 tablet, Rfl: 0 .  Aspirin-Salicylamide-Caffeine (BC HEADACHE POWDER PO), Take by mouth. Reported on 07/18/2016, Disp: , Rfl:  .  calcipotriene (  DOVONOX) 0.005 % cream, apply to affected area once daily to twice a day if needed for ps...  (REFER TO PRESCRIPTION NOTES)., Disp: , Rfl: 0 .  cyclobenzaprine (FLEXERIL) 5 MG tablet, Take 1 tablet (5 mg total) by mouth at bedtime., Disp: 30 tablet, Rfl: 0 .  Oxycodone HCl 10 MG TABS, Take 1 tablet (10 mg total) by mouth 3 (three) times daily as needed., Disp: 90 tablet, Rfl: 0 .  polyethylene glycol (MIRALAX / GLYCOLAX) packet, Take 17 g by mouth daily., Disp: , Rfl:  .  pravastatin (PRAVACHOL) 40 MG tablet, take 1 tablet by mouth once daily, Disp: 90 tablet, Rfl: 1 .  venlafaxine XR (EFFEXOR-XR) 37.5 MG 24 hr capsule, Take 1 capsule (37.5 mg total) by mouth daily with  breakfast., Disp: 30 capsule, Rfl: 2  Allergies  Allergen Reactions  . Codeine Nausea And Vomiting     Review of Systems  Respiratory: Negative for shortness of breath.   Gastrointestinal: Negative for bowel incontinence.  Genitourinary: Negative for bladder incontinence.  Musculoskeletal: Positive for back pain and neck pain.  Neurological: Negative for weakness and numbness.  Psychiatric/Behavioral: The patient is nervous/anxious and has insomnia.      Objective  Vitals:   04/11/17 0935  BP: 120/71  Pulse: 79  Resp: 16  Temp: 98 F (36.7 C)  TempSrc: Oral  SpO2: 96%  Weight: 122 lb 4.8 oz (55.5 kg)  Height: 5\' 1"  (1.549 m)    Physical Exam  Constitutional: She is oriented to person, place, and time and well-developed, well-nourished, and in no distress.  HENT:  Head: Normocephalic and atraumatic.  Cardiovascular: Normal rate, regular rhythm and normal heart sounds.   No murmur heard. Pulmonary/Chest: Effort normal and breath sounds normal. She has no wheezes.  Abdominal: Soft. Bowel sounds are normal.  Musculoskeletal: She exhibits no edema.       Cervical back: She exhibits tenderness, pain and spasm.       Back:       Right upper leg: She exhibits no swelling and no edema.       Left upper leg: She exhibits no swelling and no edema.  Neurological: She is alert and oriented to person, place, and time.  Psychiatric: Mood, memory, affect and judgment normal.  Nursing note and vitals reviewed.      Assessment & Plan  1. Chronic right-sided low back pain with right-sided sciatica Using lidocaine patch with good symptom relief, prescription provided - lidocaine (LIDODERM) 5 %; Place 1 patch onto the skin daily. Remove & Discard patch within 12 hours or as directed by MD  Dispense: 30 patch; Refill: 0  2. Chronic neck pain Stable and responsive to opioid therapy, compliant with controlled substances agreement. Refills provided - Oxycodone HCl 10 MG TABS;  Take 1 tablet (10 mg total) by mouth 3 (three) times daily as needed.  Dispense: 90 tablet; Refill: 0  3. Anxiety Symptoms of anxiety are worse because of new diagnosis of renal mass, continue alprazolam as prescribed. Patient compliant with controlled substances agreement. Refills provided - ALPRAZolam (XANAX) 0.5 MG tablet; Take 1 tablet (0.5 mg total) by mouth 2 (two) times daily as needed for anxiety.  Dispense: 60 tablet; Refill: 0    Emmerie Battaglia [facility] 04/11/2017 9:48 AM

## 2017-04-24 ENCOUNTER — Encounter: Payer: Self-pay | Admitting: Gastroenterology

## 2017-04-24 ENCOUNTER — Ambulatory Visit (INDEPENDENT_AMBULATORY_CARE_PROVIDER_SITE_OTHER): Payer: Medicare Other | Admitting: Gastroenterology

## 2017-04-24 VITALS — BP 129/67 | HR 87 | Temp 98.1°F | Ht 61.0 in | Wt 123.0 lb

## 2017-04-24 DIAGNOSIS — K838 Other specified diseases of biliary tract: Secondary | ICD-10-CM

## 2017-04-24 NOTE — Progress Notes (Signed)
Gastroenterology Consultation  Referring Provider:     Roselee Nova, MD Primary Care Physician:  Keith Rake, MD Primary Gastroenterologist:  Dr. Allen Norris     Reason for Consultation:     Dilated common bile duct        HPI:   Annette Arnold is a 56 y.o. y/o female referred for consultation & management of Dilated common bile duct by Dr. Keith Rake, MD.  This patient comes in today with a history of having her gallbladder out. The patient was having some lower abdominal pain and had a CT scan that showed her to have a renal mass suggestive of renal cell carcinoma. The patient was also found to have a dilated common bile duct of 15 mm. The patient denies any jaundice or dark urine. The patient's liver enzymes have also been normal. The patient has been set up to be evaluated for her kidney lesion at [facility]. The patient is now being sent to me for evaluation of the dilated common bile duct.  Past Medical History:  Diagnosis Date  . Anxiety   . Chronic pain   . Hyperlipidemia     Past Surgical History:  Procedure Laterality Date  . ABDOMINAL HYSTERECTOMY    . APPENDECTOMY    . BREAST LUMPECTOMY    . CARPAL TUNNEL RELEASE    . CHOLECYSTECTOMY      Prior to Admission medications   Medication Sig Start Date End Date Taking? Authorizing Provider  ALPRAZolam Duanne Moron) 0.5 MG tablet Take 1 tablet (0.5 mg total) by mouth 2 (two) times daily as needed for anxiety. 04/11/17  Yes Roselee Nova, MD  Aspirin-Salicylamide-Caffeine [facility] HEADACHE POWDER PO) Take by mouth. Reported on 07/18/2016   Yes Historical Provider, MD  calcipotriene (DOVONOX) 0.005 % cream apply to affected area once daily to twice a day if needed for ps...  (REFER TO PRESCRIPTION NOTES). 04/26/16  Yes Historical Provider, MD  cyclobenzaprine (FLEXERIL) 5 MG tablet Take 1 tablet (5 mg total) by mouth at bedtime. 04/24/16  Yes Roselee Nova, MD  lidocaine (LIDODERM) 5 % Place 1 patch onto the skin daily.  Remove & Discard patch within 12 hours or as directed by MD 04/11/17  Yes Roselee Nova, MD  Oxycodone HCl 10 MG TABS Take 1 tablet (10 mg total) by mouth 3 (three) times daily as needed. 04/11/17  Yes Roselee Nova, MD  polyethylene glycol (MIRALAX / GLYCOLAX) packet Take 17 g by mouth daily.   Yes Historical Provider, MD  pravastatin (PRAVACHOL) 40 MG tablet take 1 tablet by mouth once daily 03/11/17  Yes Roselee Nova, MD  venlafaxine XR (EFFEXOR-XR) 37.5 MG 24 hr capsule Take 1 capsule (37.5 mg total) by mouth daily with breakfast. 01/13/17  Yes Roselee Nova, MD    Family History  Problem Relation Age of Onset  . Cancer Mother   . Diabetes Mother   . Thyroid disease Mother   . Heart disease Father   . Alcohol abuse Father   . Drug abuse Sister   . Paranoid behavior Sister   . Prostate cancer Neg Hx   . Kidney cancer Neg Hx      Social History  Substance Use Topics  . Smoking status: Current Every Day Smoker    Packs/day: 1.00    Years: 30.00    Types: Cigarettes  . Smokeless tobacco: Never Used  . Alcohol use No  Allergies as of 04/24/2017 - Review Complete 04/24/2017  Allergen Reaction Noted  . Codeine Nausea And Vomiting 06/02/2015    Review of Systems:    All systems reviewed and negative except where noted in HPI.   Physical Exam:  BP 129/67   Pulse 87   Temp 98.1 F (36.7 C) (Oral)   Ht 5\' 1"  (1.549 m)   Wt 123 lb (55.8 kg)   BMI 23.24 kg/m  No LMP recorded. Patient has had a hysterectomy. Psych:  Alert and cooperative. Normal mood and affect. General:   Alert,  Well-developed, well-nourished, pleasant and cooperative in NAD Head:  Normocephalic and atraumatic. Eyes:  Sclera clear, no icterus.   Conjunctiva pink. Ears:  Normal auditory acuity. Nose:  No deformity, discharge, or lesions. Mouth:  No deformity or lesions,oropharynx pink & moist. Neck:  Supple; no masses or thyromegaly. Lungs:  Respirations even and unlabored.  Clear throughout  to auscultation.   No wheezes, crackles, or rhonchi. No acute distress. Heart:  Regular rate and rhythm; no murmurs, clicks, rubs, or gallops. Abdomen:  Normal bowel sounds.  No bruits.  Soft, non-tender and non-distended without masses, hepatosplenomegaly or hernias noted.  No guarding or rebound tenderness.  Negative Carnett sign.   Rectal:  Deferred.  Msk:  Symmetrical without gross deformities.  Good, equal movement & strength bilaterally. Pulses:  Normal pulses noted. Extremities:  No clubbing or edema.  No cyanosis. Neurologic:  Alert and oriented x3;  grossly normal neurologically. Skin:  Intact without significant lesions or rashes.  No jaundice. Lymph Nodes:  No significant cervical adenopathy. Psych:  Alert and cooperative. Normal mood and affect.  Imaging Studies: No results found.  Assessment and Plan:   Annette Arnold is a 56 y.o. y/o female with an incidental finding of a common bile duct dilatation with her CBD being 15 mm. This is enlarged even for post cholecystectomy. The patient has no signs of jaundice or abnormal liver enzymes. The patient will be set up for an MRCP to look at the bile duct to see if there is any abnormalities that may explain her dilated common bile duct. If this is negative the patient may need to undergo an upper endoscopy to look at the ampulla. The patient has been explained the plan and agrees with it.    Lucilla Lame, MD. [facility]   Note: This dictation was prepared with Dragon dictation along with smaller phrase technology. Any transcriptional errors that result from this process are unintentional.

## 2017-04-24 NOTE — Patient Instructions (Addendum)
You have a dilated bile duct. This drains your liver. This can be seen when you have your gall bladder out but not to this degree. We are ordering a MRI of the duct to make sure there is no blackage.  You are scheduled for an MRCP with and without contrast at [facility] on Thursday, May 3rd at 1:15pm. If you need to reschedule this appt for any reason, please contact Central scheduling at [phone].

## 2017-04-30 ENCOUNTER — Other Ambulatory Visit: Payer: Self-pay | Admitting: Gastroenterology

## 2017-04-30 DIAGNOSIS — K838 Other specified diseases of biliary tract: Secondary | ICD-10-CM

## 2017-05-01 ENCOUNTER — Ambulatory Visit
Admission: RE | Admit: 2017-05-01 | Discharge: 2017-05-01 | Disposition: A | Discharge status: Home / Self Care | Payer: Medicare Other | Source: Ambulatory Visit | Attending: Gastroenterology | Admitting: Gastroenterology

## 2017-05-01 DIAGNOSIS — N2889 Other specified disorders of kidney and ureter: Secondary | ICD-10-CM | POA: Diagnosis not present

## 2017-05-01 DIAGNOSIS — D1803 Hemangioma of intra-abdominal structures: Secondary | ICD-10-CM | POA: Insufficient documentation

## 2017-05-01 DIAGNOSIS — Z9049 Acquired absence of other specified parts of digestive tract: Secondary | ICD-10-CM | POA: Insufficient documentation

## 2017-05-01 DIAGNOSIS — I700 Atherosclerosis of aorta: Secondary | ICD-10-CM | POA: Diagnosis not present

## 2017-05-01 DIAGNOSIS — K838 Other specified diseases of biliary tract: Secondary | ICD-10-CM | POA: Diagnosis not present

## 2017-05-01 MED ORDER — GADOBENATE DIMEGLUMINE 529 MG/ML IV SOLN
10.0000 mL | Freq: Once | INTRAVENOUS | Status: AC | PRN
  Administered 2017-05-01: 10 mL via INTRAVENOUS

## 2017-05-07 ENCOUNTER — Ambulatory Visit (INDEPENDENT_AMBULATORY_CARE_PROVIDER_SITE_OTHER): Payer: Medicare Other | Admitting: Family Medicine

## 2017-05-07 ENCOUNTER — Encounter: Payer: Self-pay | Admitting: Family Medicine

## 2017-05-07 VITALS — BP 127/64 | HR 93 | Temp 98.0°F | Resp 16 | Ht 61.0 in | Wt 120.3 lb

## 2017-05-07 DIAGNOSIS — M62838 Other muscle spasm: Secondary | ICD-10-CM

## 2017-05-07 DIAGNOSIS — F419 Anxiety disorder, unspecified: Secondary | ICD-10-CM | POA: Diagnosis not present

## 2017-05-07 DIAGNOSIS — G8929 Other chronic pain: Secondary | ICD-10-CM | POA: Diagnosis not present

## 2017-05-07 DIAGNOSIS — M542 Cervicalgia: Secondary | ICD-10-CM

## 2017-05-07 MED ORDER — CYCLOBENZAPRINE HCL 5 MG PO TABS: 5.0000 mg | ORAL_TABLET | Freq: Every day | ORAL | 0 refills | Status: DC

## 2017-05-07 MED ORDER — OXYCODONE HCL 10 MG PO TABS: 10.0000 mg | ORAL_TABLET | Freq: Three times a day (TID) | ORAL | 0 refills | Status: DC | PRN

## 2017-05-07 MED ORDER — ALPRAZOLAM 0.5 MG PO TABS: 0.5000 mg | ORAL_TABLET | Freq: Two times a day (BID) | ORAL | 0 refills | Status: DC | PRN

## 2017-05-07 NOTE — Progress Notes (Signed)
Name: Annette Arnold   MRN: [id number]    DOB: 02-Sep-1961   Date:05/07/2017       Progress Note  Subjective  Chief Complaint  Chief Complaint  Patient presents with  . Medication Refill    Anxiety  Presents for follow-up visit. The problem has been unchanged. Symptoms include excessive worry, insomnia, malaise, nervous/anxious behavior and panic. Patient reports no depressed mood. Primary symptoms comment: worried about the mass in kidney, also about her daughter who is pregnant. The severity of symptoms is moderate and causing significant distress. The quality of sleep is fair.   Her past medical history is significant for anxiety/panic attacks. Past treatments include benzodiazephines. The treatment provided significant relief. Compliance with prior treatments has been good.  Neck Pain   This is a chronic problem. The problem occurs constantly. The pain is at a severity of 0/10 (no pain today, in general its a 6-8/10). The symptoms are aggravated by bending and position. Pertinent negatives include no visual change or weakness. She has tried oral narcotics for the symptoms. The treatment provided moderate relief.     Past Medical History:  Diagnosis Date  . Anxiety   . Chronic pain   . Hyperlipidemia     Past Surgical History:  Procedure Laterality Date  . ABDOMINAL HYSTERECTOMY    . APPENDECTOMY    . BREAST LUMPECTOMY    . CARPAL TUNNEL RELEASE    . CHOLECYSTECTOMY      Family History  Problem Relation Age of Onset  . Cancer Mother   . Diabetes Mother   . Thyroid disease Mother   . Heart disease Father   . Alcohol abuse Father   . Drug abuse Sister   . Paranoid behavior Sister   . Prostate cancer Neg Hx   . Kidney cancer Neg Hx     Social History   Social History  . Marital status: Divorced    Spouse name: N/A  . Number of children: N/A  . Years of education: N/A   Occupational History  . Not on file.   Social History Main Topics  . Smoking status:  Current Every Day Smoker    Packs/day: 1.00    Years: 30.00    Types: Cigarettes  . Smokeless tobacco: Never Used  . Alcohol use No  . Drug use: No  . Sexual activity: Yes   Other Topics Concern  . Not on file   Social History Narrative  . No narrative on file     Current Outpatient Prescriptions:  .  ALPRAZolam (XANAX) 0.5 MG tablet, Take 1 tablet (0.5 mg total) by mouth 2 (two) times daily as needed for anxiety., Disp: 60 tablet, Rfl: 0 .  Aspirin-Salicylamide-Caffeine (BC HEADACHE POWDER PO), Take by mouth. Reported on 07/18/2016, Disp: , Rfl:  .  calcipotriene (DOVONOX) 0.005 % cream, apply to affected area once daily to twice a day if needed for ps...  (REFER TO PRESCRIPTION NOTES)., Disp: , Rfl: 0 .  cyclobenzaprine (FLEXERIL) 5 MG tablet, Take 1 tablet (5 mg total) by mouth at bedtime., Disp: 30 tablet, Rfl: 0 .  lidocaine (LIDODERM) 5 %, Place 1 patch onto the skin daily. Remove & Discard patch within 12 hours or as directed by MD, Disp: 30 patch, Rfl: 0 .  Oxycodone HCl 10 MG TABS, Take 1 tablet (10 mg total) by mouth 3 (three) times daily as needed., Disp: 90 tablet, Rfl: 0 .  polyethylene glycol (MIRALAX / GLYCOLAX) packet, Take 17 g by  mouth daily., Disp: , Rfl:  .  pravastatin (PRAVACHOL) 40 MG tablet, take 1 tablet by mouth once daily, Disp: 90 tablet, Rfl: 1 .  venlafaxine XR (EFFEXOR-XR) 37.5 MG 24 hr capsule, Take 1 capsule (37.5 mg total) by mouth daily with breakfast., Disp: 30 capsule, Rfl: 2  Allergies  Allergen Reactions  . Codeine Nausea And Vomiting     Review of Systems  Musculoskeletal: Positive for neck pain.  Neurological: Negative for weakness.  Psychiatric/Behavioral: The patient is nervous/anxious and has insomnia.      Objective  Vitals:   05/07/17 1143  BP: 127/64  Pulse: 93  Resp: 16  Temp: 98 F (36.7 C)  TempSrc: Oral  SpO2: 96%  Weight: 120 lb 4.8 oz (54.6 kg)  Height: 5\' 1"  (1.549 m)    Physical Exam  Constitutional: She is  oriented to person, place, and time and well-developed, well-nourished, and in no distress.  HENT:  Head: Normocephalic and atraumatic.  Cardiovascular: Normal rate, regular rhythm and normal heart sounds.   No murmur heard. Pulmonary/Chest: Effort normal and breath sounds normal. She has no wheezes.  Abdominal: Soft. Bowel sounds are normal.  Musculoskeletal: She exhibits no edema.       Cervical back: She exhibits tenderness, pain and spasm.       Back:       Right upper leg: She exhibits no swelling and no edema.       Left upper leg: She exhibits no swelling and no edema.  Neurological: She is alert and oriented to person, place, and time.  Psychiatric: Mood, memory, affect and judgment normal.  Nursing note and vitals reviewed.     Assessment & Plan  1. Chronic neck pain Stable, responsive to opioid therapy, patient compliant with controlled substances agreement and understands the dependence potential, side effects and drug interactions of opioids. Refills provided - Oxycodone HCl 10 MG TABS; Take 1 tablet (10 mg total) by mouth 3 (three) times daily as needed.  Dispense: 90 tablet; Refill: 0  2. Anxiety Symptoms of anxiety are stable and responsive to alprazolam taken twice daily as needed - ALPRAZolam (XANAX) 0.5 MG tablet; Take 1 tablet (0.5 mg total) by mouth 2 (two) times daily as needed for anxiety.  Dispense: 60 tablet; Refill: 0  3. Muscle spasms of neck  - cyclobenzaprine (FLEXERIL) 5 MG tablet; Take 1 tablet (5 mg total) by mouth at bedtime.  Dispense: 30 tablet; Refill: 0   Laquilla Dault [facility] 05/07/2017 11:47 AM

## 2017-05-09 ENCOUNTER — Ambulatory Visit: Payer: Self-pay | Admitting: Family Medicine

## 2017-05-15 DIAGNOSIS — N2889 Other specified disorders of kidney and ureter: Secondary | ICD-10-CM | POA: Diagnosis not present

## 2017-05-22 ENCOUNTER — Telehealth: Payer: Self-pay

## 2017-05-22 NOTE — Telephone Encounter (Signed)
-----   Message from Lucilla Lame, MD sent at 05/21/2017  8:15 AM EDT ----- Let the patient know that her bile duct was dilated and further investigation will need to be done but first she should have her kidney lesion addressed and when this has resolved follow-up with [place].

## 2017-05-22 NOTE — Telephone Encounter (Signed)
Pt notified of MRI results. Pt is currently waiting on a call from [facility] to schedule appt to address kidney lesion.

## 2017-05-23 NOTE — Telephone Encounter (Signed)
ERRENOUS °

## 2017-06-04 ENCOUNTER — Ambulatory Visit (INDEPENDENT_AMBULATORY_CARE_PROVIDER_SITE_OTHER): Payer: Medicare Other | Admitting: Family Medicine

## 2017-06-04 ENCOUNTER — Encounter: Payer: Self-pay | Admitting: Family Medicine

## 2017-06-04 VITALS — BP 122/63 | HR 87 | Temp 98.1°F | Resp 16 | Ht 61.0 in | Wt 116.8 lb

## 2017-06-04 DIAGNOSIS — F419 Anxiety disorder, unspecified: Secondary | ICD-10-CM | POA: Diagnosis not present

## 2017-06-04 DIAGNOSIS — G2581 Restless legs syndrome: Secondary | ICD-10-CM

## 2017-06-04 DIAGNOSIS — M542 Cervicalgia: Secondary | ICD-10-CM

## 2017-06-04 DIAGNOSIS — G8929 Other chronic pain: Secondary | ICD-10-CM

## 2017-06-04 LAB — CBC WITH DIFFERENTIAL/PLATELET
BASOS PCT: 0 %
Basophils Absolute: 0 cells/uL (ref 0–200)
Eosinophils Absolute: 78 cells/uL (ref 15–500)
Eosinophils Relative: 1 %
HCT: 40.8 % (ref 35.0–45.0)
HEMOGLOBIN: 13.3 g/dL (ref 11.7–15.5)
LYMPHS ABS: 3276 {cells}/uL (ref 850–3900)
LYMPHS PCT: 42 %
MCH: 28.3 pg (ref 27.0–33.0)
MCHC: 32.6 g/dL (ref 32.0–36.0)
MCV: 86.8 fL (ref 80.0–100.0)
MONO ABS: 390 {cells}/uL (ref 200–950)
MPV: 9.8 fL (ref 7.5–12.5)
Monocytes Relative: 5 %
NEUTROS ABS: 4056 {cells}/uL (ref 1500–7800)
Neutrophils Relative %: 52 %
Platelets: 280 10*3/uL (ref 140–400)
RBC: 4.7 MIL/uL (ref 3.80–5.10)
RDW: 14 % (ref 11.0–15.0)
WBC: 7.8 10*3/uL (ref 3.8–10.8)

## 2017-06-04 MED ORDER — OXYCODONE HCL 10 MG PO TABS: 10.0000 mg | ORAL_TABLET | Freq: Three times a day (TID) | ORAL | 0 refills | Status: DC | PRN

## 2017-06-04 MED ORDER — ALPRAZOLAM 0.5 MG PO TABS: 0.5000 mg | ORAL_TABLET | Freq: Two times a day (BID) | ORAL | 0 refills | Status: DC | PRN

## 2017-06-04 MED ORDER — ROPINIROLE HCL 0.25 MG PO TABS: 0.2500 mg | ORAL_TABLET | Freq: Every day | ORAL | 0 refills | Status: DC

## 2017-06-04 NOTE — Progress Notes (Signed)
Name: Annette Arnold   MRN: [id number]    DOB: 14-Apr-1961   Date:06/04/2017       Progress Note  Subjective  Chief Complaint  Chief Complaint  Patient presents with  . Follow-up    1 MO  . Medication Refill    Anxiety  Presents for follow-up visit. The problem has been unchanged. Symptoms include excessive worry, insomnia, malaise, nervous/anxious behavior and panic. Patient reports no depressed mood or shortness of breath. The severity of symptoms is moderate and causing significant distress. The quality of sleep is poor (has not slept well in last 3-4 days).   Her past medical history is significant for anxiety/panic attacks. Past treatments include benzodiazephines. The treatment provided significant relief. Compliance with prior treatments has been good.  Neck Pain   This is a chronic problem. The problem occurs constantly. The problem has been unchanged. The pain is present in the midline and left side. The quality of the pain is described as aching. The pain is at a severity of 3/10. The symptoms are aggravated by bending and position (mainly change in position, also gets a headache from prolonged neck pain.). Stiffness is present in the morning. Associated symptoms include leg pain (has discomfort in legs, mainly at night, feels the urge to move her legs constantly, ). Pertinent negatives include no numbness, visual change or weakness. She has tried oral narcotics for the symptoms. The treatment provided moderate relief.  Back Pain  This is a chronic problem. The pain is present in the gluteal and lumbar spine. The quality of the pain is described as aching. The pain is at a severity of 4/10. The pain is moderate. The symptoms are aggravated by bending, position and standing. Associated symptoms include leg pain (has discomfort in legs, mainly at night, feels the urge to move her legs constantly, ). Pertinent negatives include no bladder incontinence, bowel incontinence, numbness or  weakness. She has tried analgesics (has been using Lidoderm patches for relief) for the symptoms.      Past Medical History:  Diagnosis Date  . Anxiety   . Chronic pain   . Hyperlipidemia     Past Surgical History:  Procedure Laterality Date  . ABDOMINAL HYSTERECTOMY    . APPENDECTOMY    . BREAST LUMPECTOMY    . CARPAL TUNNEL RELEASE    . CHOLECYSTECTOMY      Family History  Problem Relation Age of Onset  . Cancer Mother   . Diabetes Mother   . Thyroid disease Mother   . Heart disease Father   . Alcohol abuse Father   . Drug abuse Sister   . Paranoid behavior Sister   . Prostate cancer Neg Hx   . Kidney cancer Neg Hx     Social History   Social History  . Marital status: Divorced    Spouse name: N/A  . Number of children: N/A  . Years of education: N/A   Occupational History  . Not on file.   Social History Main Topics  . Smoking status: Current Every Day Smoker    Packs/day: 1.00    Years: 30.00    Types: Cigarettes  . Smokeless tobacco: Never Used  . Alcohol use No  . Drug use: No  . Sexual activity: Yes   Other Topics Concern  . Not on file   Social History Narrative  . No narrative on file     Current Outpatient Prescriptions:  .  ALPRAZolam (XANAX) 0.5 MG tablet, Take 1  tablet (0.5 mg total) by mouth 2 (two) times daily as needed for anxiety., Disp: 60 tablet, Rfl: 0 .  Aspirin-Salicylamide-Caffeine (BC HEADACHE POWDER PO), Take by mouth. Reported on 07/18/2016, Disp: , Rfl:  .  calcipotriene (DOVONOX) 0.005 % cream, apply to affected area once daily to twice a day if needed for ps...  (REFER TO PRESCRIPTION NOTES)., Disp: , Rfl: 0 .  cyclobenzaprine (FLEXERIL) 5 MG tablet, Take 1 tablet (5 mg total) by mouth at bedtime., Disp: 30 tablet, Rfl: 0 .  lidocaine (LIDODERM) 5 %, Place 1 patch onto the skin daily. Remove & Discard patch within 12 hours or as directed by MD, Disp: 30 patch, Rfl: 0 .  Oxycodone HCl 10 MG TABS, Take 1 tablet (10 mg  total) by mouth 3 (three) times daily as needed., Disp: 90 tablet, Rfl: 0 .  polyethylene glycol (MIRALAX / GLYCOLAX) packet, Take 17 g by mouth daily., Disp: , Rfl:  .  pravastatin (PRAVACHOL) 40 MG tablet, take 1 tablet by mouth once daily, Disp: 90 tablet, Rfl: 1 .  venlafaxine XR (EFFEXOR-XR) 37.5 MG 24 hr capsule, Take 1 capsule (37.5 mg total) by mouth daily with breakfast., Disp: 30 capsule, Rfl: 2  Allergies  Allergen Reactions  . Codeine Nausea And Vomiting     Review of Systems  Respiratory: Negative for shortness of breath.   Gastrointestinal: Negative for bowel incontinence.  Genitourinary: Negative for bladder incontinence.  Musculoskeletal: Positive for back pain and neck pain.  Neurological: Negative for weakness and numbness.  Psychiatric/Behavioral: The patient is nervous/anxious and has insomnia.      Objective  Vitals:   06/04/17 1029  BP: 122/63  Pulse: 87  Resp: 16  Temp: 98.1 F (36.7 C)  TempSrc: Oral  SpO2: 97%  Weight: 116 lb 12.8 oz (53 kg)  Height: 5\' 1"  (1.549 m)    Physical Exam  Constitutional: She is oriented to person, place, and time and well-developed, well-nourished, and in no distress.  HENT:  Head: Normocephalic and atraumatic.  Cardiovascular: Normal rate, regular rhythm and normal heart sounds.   No murmur heard. Pulmonary/Chest: Effort normal and breath sounds normal. She has no wheezes.  Musculoskeletal:       Right ankle: She exhibits no swelling.       Left ankle: She exhibits no swelling.       Back:       Right lower leg: Normal.       Left lower leg: Normal.  Neurological: She is alert and oriented to person, place, and time.  Psychiatric: Mood, memory, affect and judgment normal.  Nursing note and vitals reviewed.     Assessment & Plan  1. Anxiety Multiple family stressors, continue on alprazolam as prescribed, refills provided - ALPRAZolam (XANAX) 0.5 MG tablet; Take 1 tablet (0.5 mg total) by mouth 2 (two)  times daily as needed for anxiety.  Dispense: 60 tablet; Refill: 0  2. Chronic neck pain Chronic neck and back pain relieved with oxycodone taken up to 3 times daily when needed. Patient compliant with controlled substances agreement. Refills provided - Oxycodone HCl 10 MG TABS; Take 1 tablet (10 mg total) by mouth 3 (three) times daily as needed.  Dispense: 90 tablet; Refill: 0  3. Restless legs Start on trial of ropinirole 0.25 mg to be taken at night, reassess in one month - rOPINIRole (REQUIP) 0.25 MG tablet; Take 1 tablet (0.25 mg total) by mouth at bedtime.  Dispense: 30 tablet; Refill: 0 - CBC  with Differential/Platelet - Iron, TIBC and Ferritin Panel   Spirit Wernli [facility] 06/04/2017 10:41 AM

## 2017-06-06 ENCOUNTER — Ambulatory Visit: Payer: Self-pay | Admitting: Family Medicine

## 2017-06-06 DIAGNOSIS — M419 Scoliosis, unspecified: Secondary | ICD-10-CM | POA: Diagnosis not present

## 2017-06-06 DIAGNOSIS — N2889 Other specified disorders of kidney and ureter: Secondary | ICD-10-CM | POA: Diagnosis not present

## 2017-07-01 ENCOUNTER — Ambulatory Visit (INDEPENDENT_AMBULATORY_CARE_PROVIDER_SITE_OTHER): Payer: Medicare Other | Admitting: Family Medicine

## 2017-07-01 ENCOUNTER — Encounter: Payer: Self-pay | Admitting: Family Medicine

## 2017-07-01 DIAGNOSIS — G8929 Other chronic pain: Secondary | ICD-10-CM

## 2017-07-01 DIAGNOSIS — G2581 Restless legs syndrome: Secondary | ICD-10-CM | POA: Diagnosis not present

## 2017-07-01 DIAGNOSIS — M542 Cervicalgia: Secondary | ICD-10-CM

## 2017-07-01 DIAGNOSIS — F419 Anxiety disorder, unspecified: Secondary | ICD-10-CM | POA: Diagnosis not present

## 2017-07-01 MED ORDER — OXYCODONE HCL 10 MG PO TABS: 10.0000 mg | ORAL_TABLET | Freq: Three times a day (TID) | ORAL | 0 refills | Status: DC | PRN

## 2017-07-01 MED ORDER — ROPINIROLE HCL 0.25 MG PO TABS: 0.5000 mg | ORAL_TABLET | Freq: Every day | ORAL | 2 refills | Status: DC

## 2017-07-01 MED ORDER — ALPRAZOLAM 0.5 MG PO TABS: 0.5000 mg | ORAL_TABLET | Freq: Two times a day (BID) | ORAL | 0 refills | Status: DC | PRN

## 2017-07-01 NOTE — Progress Notes (Signed)
Name: Annette Arnold   MRN: [id number]    DOB: 01-13-1961   Date:07/01/2017       Progress Note  Subjective  Chief Complaint  Chief Complaint  Patient presents with  . Medication Refill    pain meds, aniexty and requip and cream    Anxiety  Presents for follow-up visit. The problem has been unchanged. Symptoms include excessive worry, insomnia, malaise, nervous/anxious behavior and panic. Patient reports no depressed mood or shortness of breath. Primary symptoms comment: recently worse becasue of new diagnosis of renal mass, still pending surgery. The severity of symptoms is moderate and causing significant distress. The quality of sleep is fair.   Her past medical history is significant for anxiety/panic attacks. Past treatments include benzodiazephines. The treatment provided significant relief. Compliance with prior treatments has been good.  Neck Pain   This is a chronic problem. The problem occurs constantly. The pain is present in the midline and left side. The quality of the pain is described as aching. The pain is at a severity of 4/10. The symptoms are aggravated by bending and position (mainly change in position, also gets a headache from prolonged neck pain.). Stiffness is present in the morning. Pertinent negatives include no leg pain, numbness, visual change or weakness. She has tried oral narcotics for the symptoms. The treatment provided moderate relief.  Back Pain  This is a chronic problem. The pain is present in the gluteal and lumbar spine. The quality of the pain is described as aching. The pain is at a severity of 3/10. The pain is mild. The symptoms are aggravated by bending, position and standing. Pertinent negatives include no bladder incontinence, bowel incontinence, leg pain, numbness or weakness. She has tried analgesics (has been using Lidoderm patches for relief) for the symptoms.   Patient was started on Requip 0.25 mg at bedtime for treatment of restless legs, she  believes it works great, would like to increase the dosage of Requip as tolerated. No reported side effects  Past Medical History:  Diagnosis Date  . Anxiety   . Chronic pain   . Hyperlipidemia     Past Surgical History:  Procedure Laterality Date  . ABDOMINAL HYSTERECTOMY    . APPENDECTOMY    . BREAST LUMPECTOMY    . CARPAL TUNNEL RELEASE    . CHOLECYSTECTOMY      Family History  Problem Relation Age of Onset  . Cancer Mother   . Diabetes Mother   . Thyroid disease Mother   . Heart disease Father   . Alcohol abuse Father   . Drug abuse Sister   . Paranoid behavior Sister   . Prostate cancer Neg Hx   . Kidney cancer Neg Hx     Social History   Social History  . Marital status: Divorced    Spouse name: N/A  . Number of children: N/A  . Years of education: N/A   Occupational History  . Not on file.   Social History Main Topics  . Smoking status: Current Every Day Smoker    Packs/day: 1.00    Years: 30.00    Types: Cigarettes  . Smokeless tobacco: Never Used  . Alcohol use No  . Drug use: No  . Sexual activity: Yes   Other Topics Concern  . Not on file   Social History Narrative  . No narrative on file     Current Outpatient Prescriptions:  .  ALPRAZolam (XANAX) 0.5 MG tablet, Take 1 tablet (0.5 mg  total) by mouth 2 (two) times daily as needed for anxiety., Disp: 60 tablet, Rfl: 0 .  Aspirin-Salicylamide-Caffeine (BC HEADACHE POWDER PO), Take by mouth. Reported on 07/18/2016, Disp: , Rfl:  .  calcipotriene (DOVONOX) 0.005 % cream, apply to affected area once daily to twice a day if needed for ps...  (REFER TO PRESCRIPTION NOTES)., Disp: , Rfl: 0 .  cyclobenzaprine (FLEXERIL) 5 MG tablet, Take 1 tablet (5 mg total) by mouth at bedtime., Disp: 30 tablet, Rfl: 0 .  lidocaine (LIDODERM) 5 %, Place 1 patch onto the skin daily. Remove & Discard patch within 12 hours or as directed by MD, Disp: 30 patch, Rfl: 0 .  Oxycodone HCl 10 MG TABS, Take 1 tablet (10 mg  total) by mouth 3 (three) times daily as needed., Disp: 90 tablet, Rfl: 0 .  polyethylene glycol (MIRALAX / GLYCOLAX) packet, Take 17 g by mouth daily., Disp: , Rfl:  .  pravastatin (PRAVACHOL) 40 MG tablet, take 1 tablet by mouth once daily, Disp: 90 tablet, Rfl: 1 .  rOPINIRole (REQUIP) 0.25 MG tablet, Take 1 tablet (0.25 mg total) by mouth at bedtime., Disp: 30 tablet, Rfl: 0 .  venlafaxine XR (EFFEXOR-XR) 37.5 MG 24 hr capsule, Take 1 capsule (37.5 mg total) by mouth daily with breakfast., Disp: 30 capsule, Rfl: 2  Allergies  Allergen Reactions  . Codeine Nausea And Vomiting     Review of Systems  Respiratory: Negative for shortness of breath.   Gastrointestinal: Negative for bowel incontinence.  Genitourinary: Negative for bladder incontinence.  Musculoskeletal: Positive for back pain and neck pain.  Neurological: Negative for weakness and numbness.  Psychiatric/Behavioral: The patient is nervous/anxious and has insomnia.       Objective  Vitals:   07/01/17 1552  BP: 118/62  Pulse: 81  Resp: 16  Temp: 97.8 F (36.6 C)  TempSrc: Oral  SpO2: 91%  Weight: 120 lb 12.8 oz (54.8 kg)  Height: 5\' 1"  (1.549 m)    Physical Exam  Constitutional: She is oriented to person, place, and time and well-developed, well-nourished, and in no distress.  HENT:  Head: Normocephalic and atraumatic.  Cardiovascular: Normal rate, regular rhythm and normal heart sounds.   No murmur heard. Pulmonary/Chest: Effort normal and breath sounds normal. She has no wheezes.  Abdominal: Soft. Bowel sounds are normal. There is no tenderness.  Musculoskeletal: She exhibits no edema.       Right ankle: She exhibits no swelling.       Left ankle: She exhibits no swelling.       Back:       Right lower leg: Normal.       Left lower leg: Normal.  Neurological: She is alert and oriented to person, place, and time.  Psychiatric: Mood, memory, affect and judgment normal.  Nursing note and vitals  reviewed.    Assessment & Plan  1. Anxiety Stable, continue alprazolam twice a day when necessary for treatment of anxiety, patient compliant with controlled substances agreement - ALPRAZolam (XANAX) 0.5 MG tablet; Take 1 tablet (0.5 mg total) by mouth 2 (two) times daily as needed for anxiety.  Dispense: 60 tablet; Refill: 0  2. Chronic neck pain Stable and responsive to opioids, patient compliant with controlled substances agreement, refills provided - Oxycodone HCl 10 MG TABS; Take 1 tablet (10 mg total) by mouth 3 (three) times daily as needed.  Dispense: 90 tablet; Refill: 0  3. Restless legs May increase Requip to 0.5 mg to be taken  at bedtime - rOPINIRole (REQUIP) 0.25 MG tablet; Take 2 tablets (0.5 mg total) by mouth at bedtime.  Dispense: 60 tablet; Refill: 2   Marlon Vonruden [facility] 07/01/2017 3:57 PM

## 2017-07-04 ENCOUNTER — Ambulatory Visit: Payer: Self-pay | Admitting: Family Medicine

## 2017-07-15 DIAGNOSIS — N2889 Other specified disorders of kidney and ureter: Secondary | ICD-10-CM | POA: Diagnosis not present

## 2017-07-23 ENCOUNTER — Ambulatory Visit: Payer: Self-pay | Admitting: Family Medicine

## 2017-07-30 ENCOUNTER — Encounter: Payer: Self-pay | Admitting: Family Medicine

## 2017-07-30 ENCOUNTER — Ambulatory Visit (INDEPENDENT_AMBULATORY_CARE_PROVIDER_SITE_OTHER): Payer: Medicare Other | Admitting: Family Medicine

## 2017-07-30 DIAGNOSIS — M5442 Lumbago with sciatica, left side: Secondary | ICD-10-CM

## 2017-07-30 DIAGNOSIS — M5441 Lumbago with sciatica, right side: Secondary | ICD-10-CM

## 2017-07-30 DIAGNOSIS — F419 Anxiety disorder, unspecified: Secondary | ICD-10-CM | POA: Diagnosis not present

## 2017-07-30 DIAGNOSIS — G8929 Other chronic pain: Secondary | ICD-10-CM

## 2017-07-30 DIAGNOSIS — M542 Cervicalgia: Secondary | ICD-10-CM | POA: Diagnosis not present

## 2017-07-30 MED ORDER — ALPRAZOLAM 0.5 MG PO TABS: 0.5000 mg | ORAL_TABLET | Freq: Two times a day (BID) | ORAL | 0 refills | Status: DC | PRN

## 2017-07-30 MED ORDER — OXYCODONE HCL 10 MG PO TABS: 10.0000 mg | ORAL_TABLET | Freq: Three times a day (TID) | ORAL | 0 refills | Status: DC | PRN

## 2017-07-30 NOTE — Progress Notes (Signed)
Name: Annette Arnold   MRN: [id number]    DOB: 1961/10/24   Date:07/30/2017       Progress Note  Subjective  Chief Complaint  Chief Complaint  Patient presents with  . Medication Refill    Anxiety  Presents for follow-up visit. The problem has been unchanged. Symptoms include excessive worry, insomnia, malaise, nervous/anxious behavior and panic. Patient reports no depressed mood or shortness of breath. Primary symptoms comment: recently worse becasue of new diagnosis of renal mass, still pending surgery, also worried about her daughter's health post partum. The severity of symptoms is moderate and causing significant distress. The quality of sleep is fair.   Her past medical history is significant for anxiety/panic attacks. Past treatments include benzodiazephines. The treatment provided significant relief. Compliance with prior treatments has been good.  Neck Pain   This is a chronic problem. The problem occurs constantly. The problem has been gradually worsening (worse over the last weekend, pain radiating into the left shoulderblade and under the armpit.). The pain is present in the midline, left side and right side. The quality of the pain is described as aching and stabbing. The pain is at a severity of 3/10. The pain is moderate. The symptoms are aggravated by bending and position (mainly change in position, also gets a headache from prolonged neck pain.). Stiffness is present in the morning. Pertinent negatives include no leg pain. She has tried oral narcotics for the symptoms. The treatment provided moderate relief.  Back Pain  This is a chronic problem. The pain is present in the gluteal and lumbar spine. The quality of the pain is described as aching. The pain is at a severity of 3/10. The pain is mild. The symptoms are aggravated by bending, position and standing. Pertinent negatives include no bladder incontinence, bowel incontinence or leg pain. She has tried analgesics for the  symptoms.     Past Medical History:  Diagnosis Date  . Anxiety   . Chronic pain   . Hyperlipidemia     Past Surgical History:  Procedure Laterality Date  . ABDOMINAL HYSTERECTOMY    . APPENDECTOMY    . BREAST LUMPECTOMY    . CARPAL TUNNEL RELEASE    . CHOLECYSTECTOMY      Family History  Problem Relation Age of Onset  . Cancer Mother   . Diabetes Mother   . Thyroid disease Mother   . Heart disease Father   . Alcohol abuse Father   . Drug abuse Sister   . Paranoid behavior Sister   . Prostate cancer Neg Hx   . Kidney cancer Neg Hx     Social History   Social History  . Marital status: Divorced    Spouse name: N/A  . Number of children: N/A  . Years of education: N/A   Occupational History  . Not on file.   Social History Main Topics  . Smoking status: Current Every Day Smoker    Packs/day: 1.00    Years: 30.00    Types: Cigarettes  . Smokeless tobacco: Never Used  . Alcohol use No  . Drug use: No  . Sexual activity: Yes   Other Topics Concern  . Not on file   Social History Narrative  . No narrative on file     Current Outpatient Prescriptions:  .  ALPRAZolam (XANAX) 0.5 MG tablet, Take 1 tablet (0.5 mg total) by mouth 2 (two) times daily as needed for anxiety., Disp: 60 tablet, Rfl: 0 .  Aspirin-Salicylamide-Caffeine (  BC HEADACHE POWDER PO), Take by mouth. Reported on 07/18/2016, Disp: , Rfl:  .  calcipotriene (DOVONOX) 0.005 % cream, apply to affected area once daily to twice a day if needed for ps...  (REFER TO PRESCRIPTION NOTES)., Disp: , Rfl: 0 .  cyclobenzaprine (FLEXERIL) 5 MG tablet, Take 1 tablet (5 mg total) by mouth at bedtime., Disp: 30 tablet, Rfl: 0 .  lidocaine (LIDODERM) 5 %, Place 1 patch onto the skin daily. Remove & Discard patch within 12 hours or as directed by MD, Disp: 30 patch, Rfl: 0 .  Oxycodone HCl 10 MG TABS, Take 1 tablet (10 mg total) by mouth 3 (three) times daily as needed., Disp: 90 tablet, Rfl: 0 .  polyethylene  glycol (MIRALAX / GLYCOLAX) packet, Take 17 g by mouth daily., Disp: , Rfl:  .  pravastatin (PRAVACHOL) 40 MG tablet, take 1 tablet by mouth once daily, Disp: 90 tablet, Rfl: 1 .  rOPINIRole (REQUIP) 0.25 MG tablet, Take 2 tablets (0.5 mg total) by mouth at bedtime., Disp: 60 tablet, Rfl: 2 .  venlafaxine XR (EFFEXOR-XR) 37.5 MG 24 hr capsule, Take 1 capsule (37.5 mg total) by mouth daily with breakfast., Disp: 30 capsule, Rfl: 2  Allergies  Allergen Reactions  . Codeine Nausea And Vomiting     Review of Systems  Respiratory: Negative for shortness of breath.   Gastrointestinal: Negative for bowel incontinence.  Genitourinary: Negative for bladder incontinence.  Musculoskeletal: Positive for back pain and neck pain.  Psychiatric/Behavioral: The patient is nervous/anxious and has insomnia.     Objective  Vitals:   07/30/17 1422  BP: 102/70  Pulse: (!) 102  Resp: 16  Temp: 98 F (36.7 C)  TempSrc: Oral  SpO2: 98%  Weight: 119 lb 12.8 oz (54.3 kg)  Height: 5\' 1"  (1.549 m)    Physical Exam  Constitutional: She is oriented to person, place, and time and well-developed, well-nourished, and in no distress.  HENT:  Head: Normocephalic and atraumatic.  Cardiovascular: Normal rate, regular rhythm and normal heart sounds.   No murmur heard. Pulmonary/Chest: Effort normal and breath sounds normal. She has no wheezes.  Musculoskeletal: She exhibits no edema.       Back:  Neurological: She is alert and oriented to person, place, and time.  Psychiatric: Mood, memory, affect and judgment normal.  Nursing note and vitals reviewed.         Assessment & Plan  1. Anxiety Overall worse, continue alprazolam as needed for relief, patient compliant with controlled substances agreement, understands the dependence potential, side effects and drug interactions of benzodiazepines with opioids, refills provided and follow-up in one month - ALPRAZolam (XANAX) 0.5 MG tablet; Take 1 tablet  (0.5 mg total) by mouth 2 (two) times daily as needed for anxiety.  Dispense: 60 tablet; Refill: 0  2. Chronic neck pain Marginally worse from last month, continue on oxycodone up to 3 times daily unchanged, patient compliant with controlled substances agreement - Oxycodone HCl 10 MG TABS; Take 1 tablet (10 mg total) by mouth 3 (three) times daily as needed.  Dispense: 90 tablet; Refill: 0  3. Chronic left-sided low back pain with bilateral sciatica Continue to use Lidoderm patches as needed for relief.   Tomislav Micale [facility] 07/30/2017 2:44 PM

## 2017-08-18 ENCOUNTER — Telehealth: Payer: Self-pay | Admitting: Family Medicine

## 2017-08-18 DIAGNOSIS — Z01818 Encounter for other preprocedural examination: Secondary | ICD-10-CM

## 2017-08-18 NOTE — Telephone Encounter (Signed)
Do you know anything about this patient needing to see a cardiologist?

## 2017-08-18 NOTE — Telephone Encounter (Signed)
Pt is asking that you please try to get her in to see a heart doctor here in [place]. She is not able to see the one in [place] her surgeon will not due the surgery until this is complete. please return call to discuss further [phone]

## 2017-08-20 NOTE — Telephone Encounter (Signed)
I have not received any official communication from patient's surgeon at [facility] but it appears that she needs a preoperative cardiac evaluation. Based on her records, she has seen Dr. Ubaldo Glassing at [facility] in the past and she may be referred to the same. Please advise

## 2017-08-21 NOTE — Addendum Note (Signed)
Addended by: Saunders Glance A on: 08/21/2017 07:54 AM   Modules accepted: Orders

## 2017-08-25 ENCOUNTER — Other Ambulatory Visit: Payer: Self-pay | Admitting: Cardiology

## 2017-08-25 DIAGNOSIS — R079 Chest pain, unspecified: Secondary | ICD-10-CM | POA: Diagnosis not present

## 2017-08-25 DIAGNOSIS — N2889 Other specified disorders of kidney and ureter: Secondary | ICD-10-CM | POA: Diagnosis not present

## 2017-08-27 ENCOUNTER — Encounter
Admission: RE | Admit: 2017-08-27 | Discharge: 2017-08-27 | Disposition: A | Discharge status: Home / Self Care | Payer: Medicare Other | Source: Ambulatory Visit | Attending: Cardiology | Admitting: Cardiology

## 2017-08-27 DIAGNOSIS — R079 Chest pain, unspecified: Secondary | ICD-10-CM

## 2017-08-27 MED ORDER — TECHNETIUM TC 99M TETROFOSMIN IV KIT: 13.0000 | PACK | Freq: Once | INTRAVENOUS | Status: DC | PRN

## 2017-08-28 ENCOUNTER — Ambulatory Visit (INDEPENDENT_AMBULATORY_CARE_PROVIDER_SITE_OTHER): Payer: Medicare Other | Admitting: Family Medicine

## 2017-08-28 ENCOUNTER — Encounter: Payer: Self-pay | Admitting: Family Medicine

## 2017-08-28 DIAGNOSIS — F419 Anxiety disorder, unspecified: Secondary | ICD-10-CM

## 2017-08-28 DIAGNOSIS — M62838 Other muscle spasm: Secondary | ICD-10-CM

## 2017-08-28 DIAGNOSIS — G8929 Other chronic pain: Secondary | ICD-10-CM

## 2017-08-28 DIAGNOSIS — M542 Cervicalgia: Secondary | ICD-10-CM | POA: Diagnosis not present

## 2017-08-28 MED ORDER — OXYCODONE HCL 10 MG PO TABS: 10.0000 mg | ORAL_TABLET | Freq: Three times a day (TID) | ORAL | 0 refills | Status: DC | PRN

## 2017-08-28 MED ORDER — ALPRAZOLAM 0.5 MG PO TABS: 0.5000 mg | ORAL_TABLET | Freq: Two times a day (BID) | ORAL | 0 refills | Status: DC | PRN

## 2017-08-28 MED ORDER — CYCLOBENZAPRINE HCL 5 MG PO TABS: 5.0000 mg | ORAL_TABLET | Freq: Every day | ORAL | 0 refills | Status: DC

## 2017-08-28 NOTE — Progress Notes (Signed)
Name: Annette Arnold   MRN: [id number]    DOB: [birth year]   Date:08/28/2017       Progress Note  Subjective  Chief Complaint  Chief Complaint  Patient presents with  . Follow-up    1 mo  . Medication Refill    Anxiety  Presents for follow-up visit. The problem has been unchanged. Symptoms include excessive worry, insomnia, malaise, nervous/anxious behavior and panic. Patient reports no depressed mood. Primary symptoms comment: worried about the mass in kidney, . The severity of symptoms is moderate and causing significant distress. The quality of sleep is fair.   Her past medical history is significant for anxiety/panic attacks. Past treatments include benzodiazephines. The treatment provided significant relief. Compliance with prior treatments has been good.  Neck Pain   This is a chronic problem. The problem occurs constantly. The problem has been unchanged. The pain is at a severity of 3/10. The symptoms are aggravated by bending and position. Pertinent negatives include no fever, headaches or leg pain. She has tried oral narcotics for the symptoms. The treatment provided moderate relief.   She has muscle spasm of her neck associated with chronic neck pain, takes Cyclobenzaprine 5 mg three times daily as needed, reports good symptomatic relief.    Past Medical History:  Diagnosis Date  . Anxiety   . Chronic pain   . Hyperlipidemia     Past Surgical History:  Procedure Laterality Date  . ABDOMINAL HYSTERECTOMY    . APPENDECTOMY    . BREAST LUMPECTOMY    . CARPAL TUNNEL RELEASE    . CHOLECYSTECTOMY      Family History  Problem Relation Age of Onset  . Cancer Mother   . Diabetes Mother   . Thyroid disease Mother   . Heart disease Father   . Alcohol abuse Father   . Drug abuse Sister   . Paranoid behavior Sister   . Prostate cancer Neg Hx   . Kidney cancer Neg Hx     Social History   Social History  . Marital status: Divorced    Spouse name: N/A  . Number of  children: N/A  . Years of education: N/A   Occupational History  . Not on file.   Social History Main Topics  . Smoking status: Current Every Day Smoker    Packs/day: 1.00    Years: 30.00    Types: Cigarettes  . Smokeless tobacco: Never Used  . Alcohol use No  . Drug use: No  . Sexual activity: Yes   Other Topics Concern  . Not on file   Social History Narrative  . No narrative on file     Current Outpatient Prescriptions:  .  ALPRAZolam (XANAX) 0.5 MG tablet, Take 1 tablet (0.5 mg total) by mouth 2 (two) times daily as needed for anxiety., Disp: 60 tablet, Rfl: 0 .  Aspirin-Salicylamide-Caffeine (BC HEADACHE POWDER PO), Take by mouth. Reported on 07/18/2016, Disp: , Rfl:  .  calcipotriene (DOVONOX) 0.005 % cream, apply to affected area once daily to twice a day if needed for ps...  (REFER TO PRESCRIPTION NOTES)., Disp: , Rfl: 0 .  cyclobenzaprine (FLEXERIL) 5 MG tablet, Take 1 tablet (5 mg total) by mouth at bedtime., Disp: 30 tablet, Rfl: 0 .  lidocaine (LIDODERM) 5 %, Place 1 patch onto the skin daily. Remove & Discard patch within 12 hours or as directed by MD, Disp: 30 patch, Rfl: 0 .  Oxycodone HCl 10 MG TABS, Take 1 tablet (10 mg  total) by mouth 3 (three) times daily as needed., Disp: 90 tablet, Rfl: 0 .  polyethylene glycol (MIRALAX / GLYCOLAX) packet, Take 17 g by mouth daily., Disp: , Rfl:  .  pravastatin (PRAVACHOL) 40 MG tablet, take 1 tablet by mouth once daily, Disp: 90 tablet, Rfl: 1 .  rOPINIRole (REQUIP) 0.25 MG tablet, Take 2 tablets (0.5 mg total) by mouth at bedtime., Disp: 60 tablet, Rfl: 2 .  venlafaxine XR (EFFEXOR-XR) 37.5 MG 24 hr capsule, Take 1 capsule (37.5 mg total) by mouth daily with breakfast., Disp: 30 capsule, Rfl: 2  Allergies  Allergen Reactions  . Codeine Nausea And Vomiting     Review of Systems  Constitutional: Negative for fever.  Musculoskeletal: Positive for neck pain.  Neurological: Negative for headaches.    Psychiatric/Behavioral: The patient is nervous/anxious and has insomnia.      Objective  Vitals:   08/28/17 1004  BP: 108/67  Pulse: 80  Resp: 16  Temp: 97.9 F (36.6 C)  TempSrc: Oral  SpO2: 95%  Weight: 121 lb 4.8 oz (55 kg)  Height: 5\' 1"  (1.549 m)    Physical Exam  Constitutional: She is oriented to person, place, and time and well-developed, well-nourished, and in no distress.  HENT:  Head: Normocephalic and atraumatic.  Cardiovascular: Normal rate, regular rhythm and normal heart sounds.   No murmur heard. Pulmonary/Chest: Effort normal and breath sounds normal. She has no wheezes.  Abdominal: Soft. Bowel sounds are normal.  Musculoskeletal: She exhibits no edema.       Cervical back: She exhibits tenderness, pain and spasm.       Back:       Right upper leg: She exhibits no swelling and no edema.       Left upper leg: She exhibits no swelling and no edema.  Neurological: She is alert and oriented to person, place, and time.  Psychiatric: Mood, memory, affect and judgment normal.  Nursing note and vitals reviewed.   Assessment & Plan  1. Anxiety Stable and responsive to alprazolam taken up to twice daily as needed, refills provided - ALPRAZolam (XANAX) 0.5 MG tablet; Take 1 tablet (0.5 mg total) by mouth 2 (two) times daily as needed for anxiety.  Dispense: 60 tablet; Refill: 0  2. Chronic neck pain Chronic neck and back pain is responsive to opioid treatment, patient compliant with controlled substances agreement and understands the dependence potential, side effects and drug reactions of opioids, refills provided - Oxycodone HCl 10 MG TABS; Take 1 tablet (10 mg total) by mouth 3 (three) times daily as needed.  Dispense: 90 tablet; Refill: 0  3. Muscle spasms of neck  - cyclobenzaprine (FLEXERIL) 5 MG tablet; Take 1 tablet (5 mg total) by mouth at bedtime.  Dispense: 30 tablet; Refill: 0   Wilson Dusenbery [facility] 08/28/2017 10:39 AM

## 2017-09-01 IMAGING — US US TRANSVAGINAL NON-OB
1 series · 14 of 25 positions shown · non-contrast
Comparison: Report of a pelvic ultrasound dated December 02, 2002

CLINICAL DATA: Four months of pelvic pain. History of previous
hysterectomy but the patient reports that she retains her ovaries.

EXAM:
TRANSABDOMINAL AND TRANSVAGINAL ULTRASOUND OF PELVIS
TECHNIQUE: Both transabdominal and transvaginal ultrasound examinations of the
pelvis were performed. Transabdominal technique was performed for
global imaging of the pelvis including uterus, ovaries, adnexal
regions, and pelvic cul-de-sac. It was necessary to proceed with
endovaginal exam following the transabdominal exam to visualize the
ovaries and adnexal regions..

[Series 1: us transvaginal non-ob · 0.23mm/px · 14 of 61 slices shown]
[im 1/61]
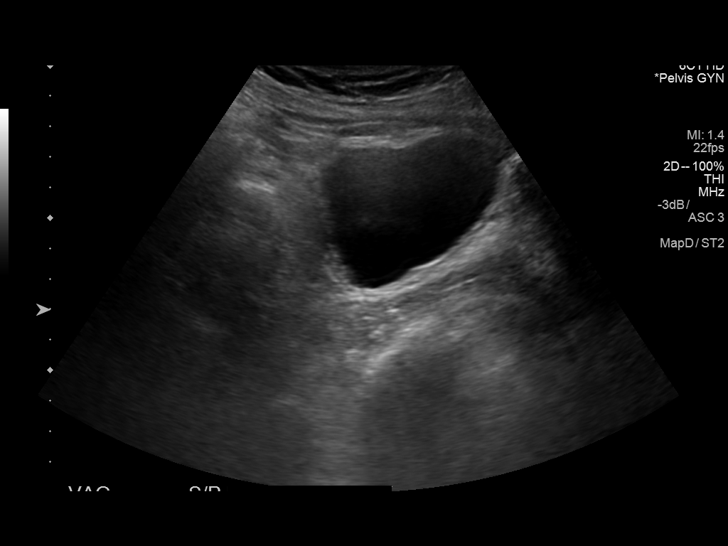
[im 6/61]
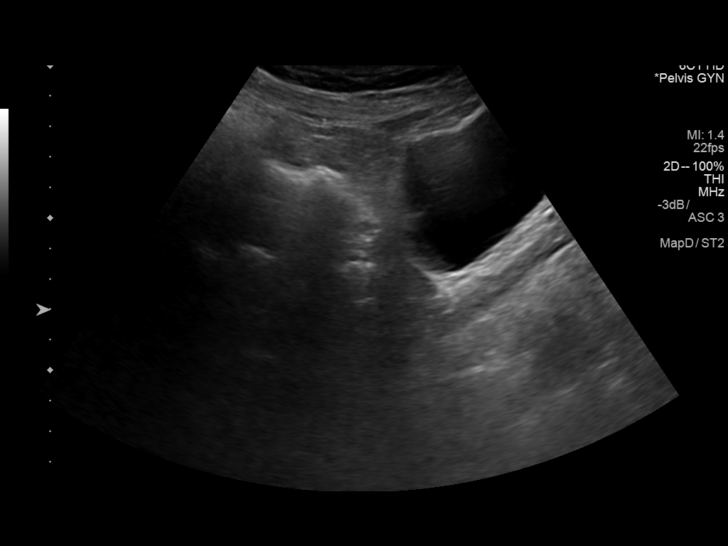
[im 11/61]
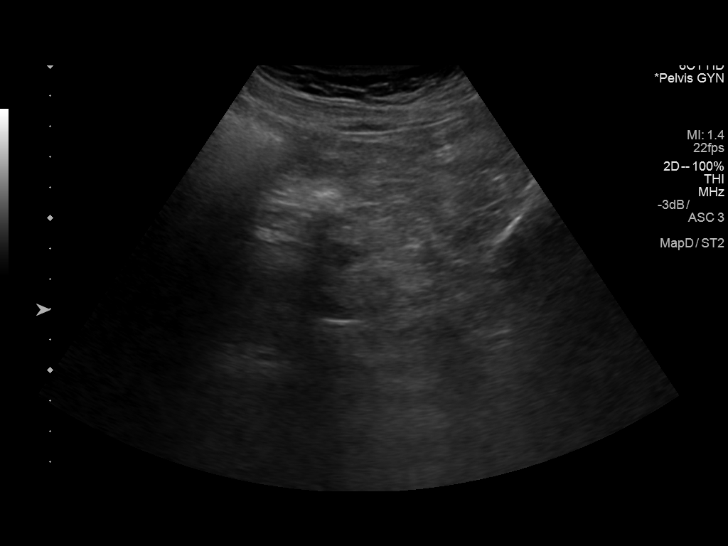
[im 16/61]
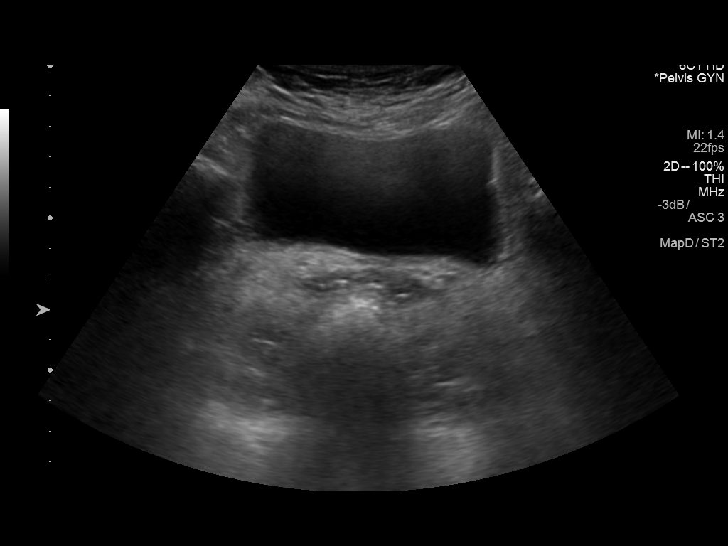
[im 21/61]
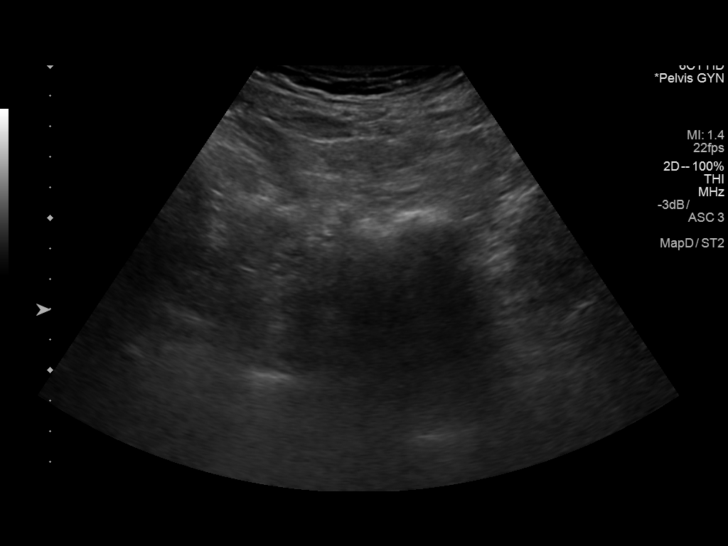
[im 23/61]
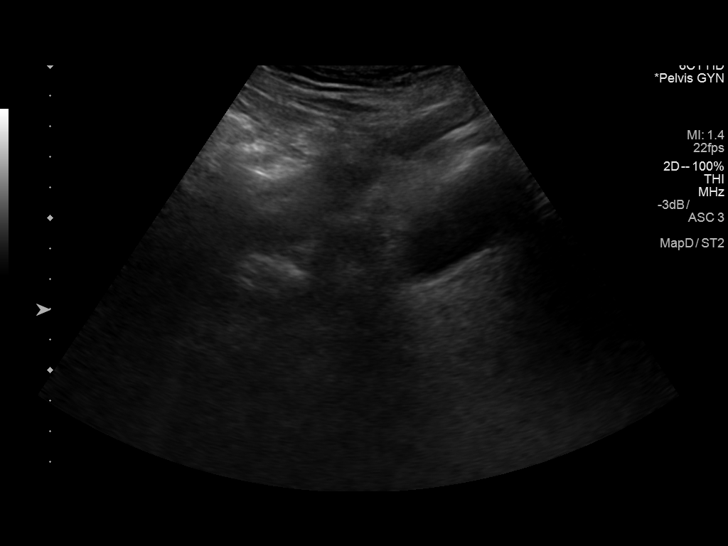
[im 28/61]
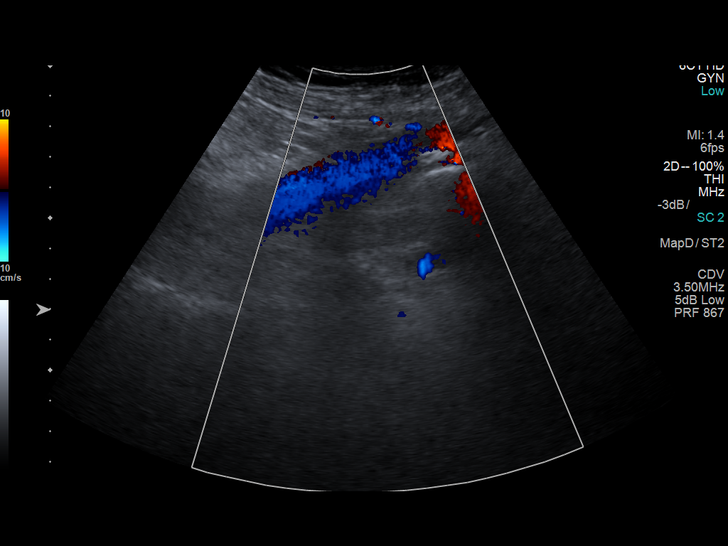
[im 33/61]
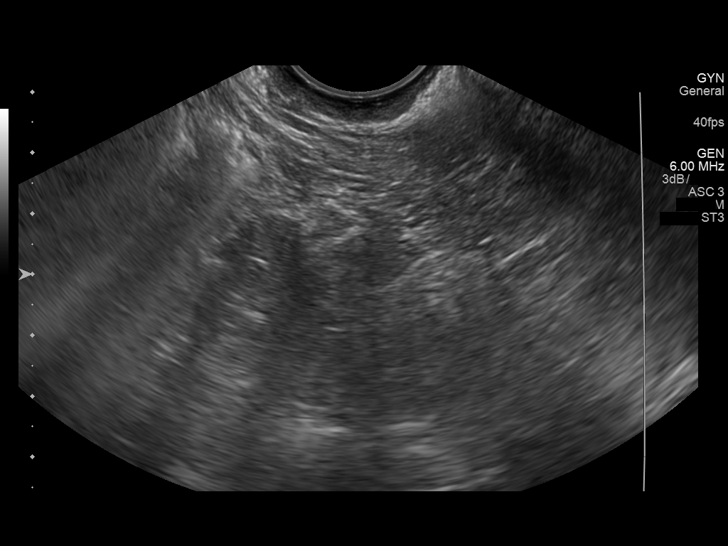
[im 38/61]
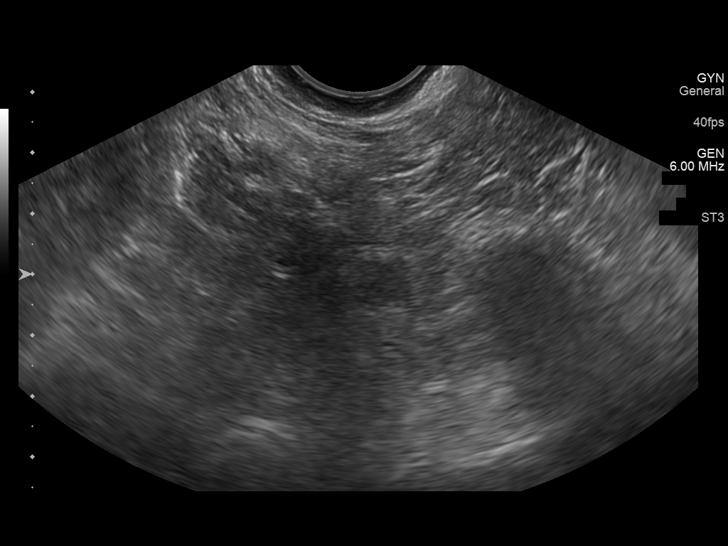
[im 41/61]
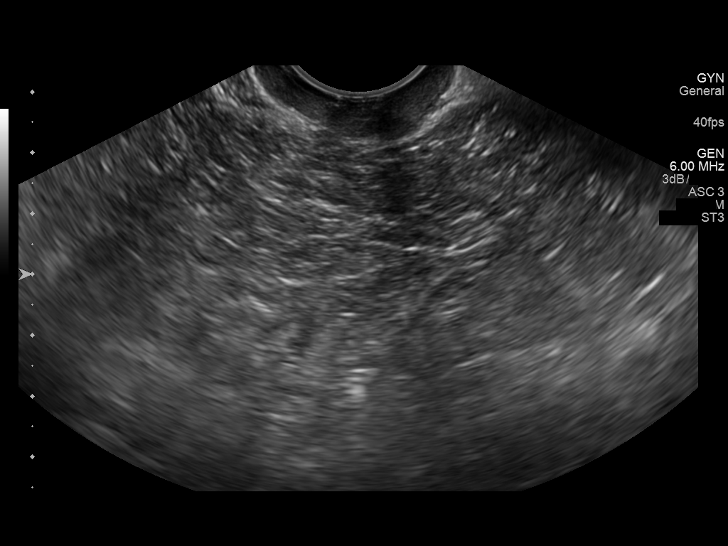
[im 46/61]
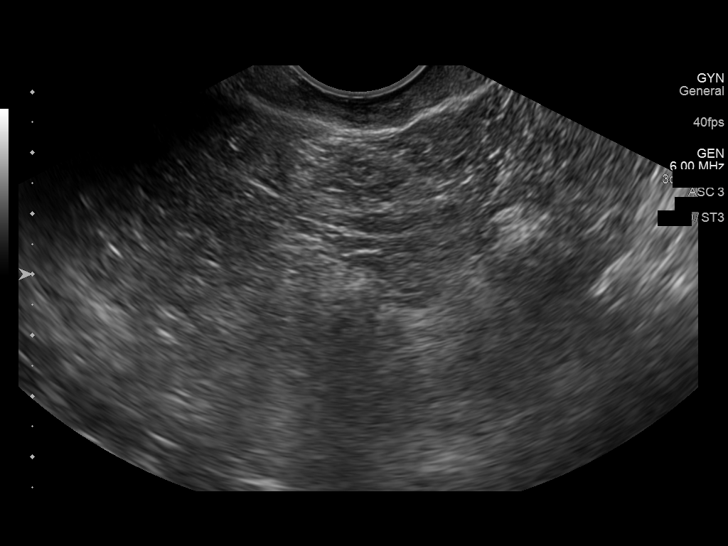
[im 51/61]
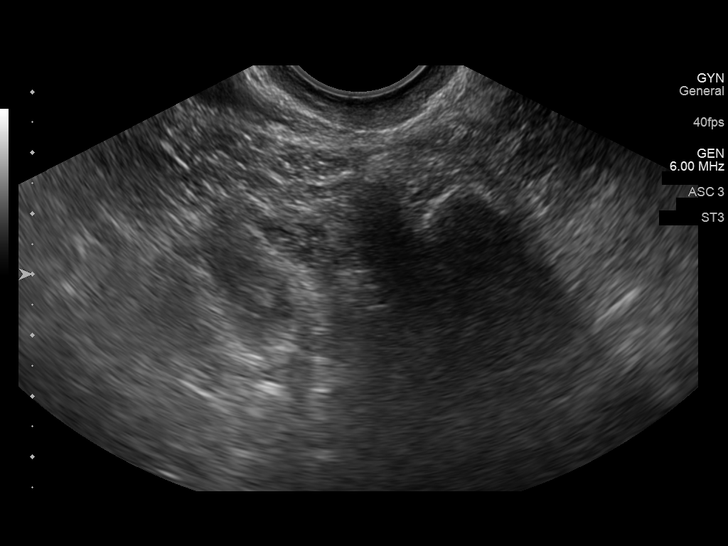
[im 56/61]
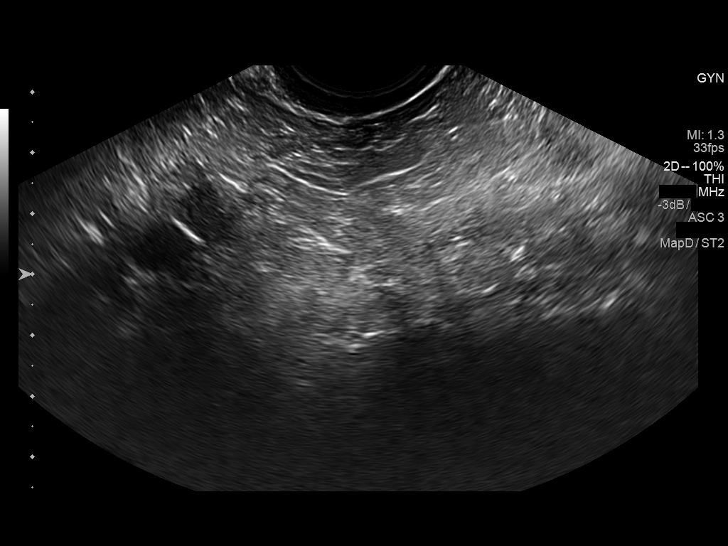
[im 61/61]
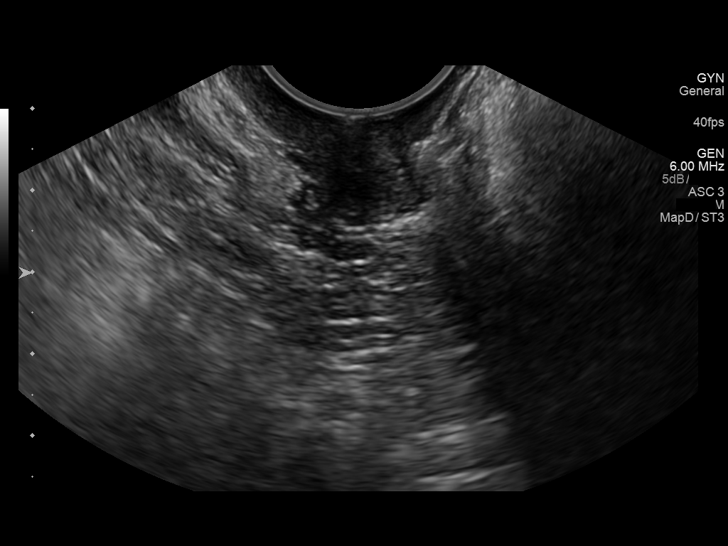

[14 of 25 positions shown; findings below may reference images not displayed]

FINDINGS: Uterus

The uterus is surgically absent.

Right ovary

Not visualized due to bowel gas.

Left ovary

Not visualized due to bowel gas.

Other findings

No abnormal free fluid.
IMPRESSION: The ovaries could not be visualized due to excessive bowel gas. No
adnexal masses are observed. The uterus is surgically absent.
Further evaluation with CT scanning or MRI could be considered.

## 2017-09-11 ENCOUNTER — Ambulatory Visit: Payer: Medicare Other

## 2017-09-16 DIAGNOSIS — Z01818 Encounter for other preprocedural examination: Secondary | ICD-10-CM | POA: Diagnosis not present

## 2017-09-16 DIAGNOSIS — I4510 Unspecified right bundle-branch block: Secondary | ICD-10-CM | POA: Insufficient documentation

## 2017-09-16 DIAGNOSIS — N2889 Other specified disorders of kidney and ureter: Secondary | ICD-10-CM | POA: Diagnosis not present

## 2017-09-17 ENCOUNTER — Encounter: Admission: RE | Admit: 2017-09-17 | Payer: Medicare Other | Source: Ambulatory Visit

## 2017-09-24 ENCOUNTER — Encounter: Payer: Self-pay | Admitting: Family Medicine

## 2017-09-24 ENCOUNTER — Ambulatory Visit (INDEPENDENT_AMBULATORY_CARE_PROVIDER_SITE_OTHER): Payer: Medicare Other | Admitting: Family Medicine

## 2017-09-24 DIAGNOSIS — F419 Anxiety disorder, unspecified: Secondary | ICD-10-CM | POA: Diagnosis not present

## 2017-09-24 DIAGNOSIS — G8929 Other chronic pain: Secondary | ICD-10-CM | POA: Diagnosis not present

## 2017-09-24 DIAGNOSIS — E785 Hyperlipidemia, unspecified: Secondary | ICD-10-CM

## 2017-09-24 DIAGNOSIS — M542 Cervicalgia: Secondary | ICD-10-CM | POA: Diagnosis not present

## 2017-09-24 MED ORDER — OXYCODONE HCL 10 MG PO TABS: 10.0000 mg | ORAL_TABLET | Freq: Three times a day (TID) | ORAL | 0 refills | Status: DC | PRN

## 2017-09-24 MED ORDER — ALPRAZOLAM 0.5 MG PO TABS: 0.5000 mg | ORAL_TABLET | Freq: Two times a day (BID) | ORAL | 0 refills | Status: DC | PRN

## 2017-09-24 MED ORDER — PRAVASTATIN SODIUM 40 MG PO TABS: 40.0000 mg | ORAL_TABLET | Freq: Every day | ORAL | 1 refills | Status: DC

## 2017-09-24 NOTE — Progress Notes (Signed)
Name: Annette Arnold   MRN: [id number]    DOB: 1961/03/18   Date:09/24/2017       Progress Note  Subjective  Chief Complaint  Chief Complaint  Patient presents with  . Medication Refill    Anxiety  Presents for follow-up visit. The problem has been unchanged. Symptoms include excessive worry, insomnia, malaise, nervous/anxious behavior and panic. Patient reports no decreased concentration, depressed mood, shortness of breath or suicidal ideas. Symptoms occur most days. The severity of symptoms is moderate and causing significant distress. The quality of sleep is fair.   Her past medical history is significant for anxiety/panic attacks. Past treatments include benzodiazephines. The treatment provided significant relief. Compliance with prior treatments has been good.  Neck Pain   This is a chronic problem. The problem occurs constantly. The problem has been unchanged. The pain is at a severity of 1/10. The symptoms are aggravated by bending and position. Pertinent negatives include no fever, headaches, leg pain or paresis. She has tried oral narcotics for the symptoms. The treatment provided moderate relief.  Hyperlipidemia  This is a chronic problem. The problem is uncontrolled. Recent lipid tests were reviewed and are high. Pertinent negatives include no leg pain, myalgias or shortness of breath.    Past Medical History:  Diagnosis Date  . Anxiety   . Chronic pain   . Hyperlipidemia     Past Surgical History:  Procedure Laterality Date  . ABDOMINAL HYSTERECTOMY    . APPENDECTOMY    . BREAST LUMPECTOMY    . CARPAL TUNNEL RELEASE    . CHOLECYSTECTOMY      Family History  Problem Relation Age of Onset  . Cancer Mother   . Diabetes Mother   . Thyroid disease Mother   . Heart disease Father   . Alcohol abuse Father   . Drug abuse Sister   . Paranoid behavior Sister   . Prostate cancer Neg Hx   . Kidney cancer Neg Hx     Social History   Social History  . Marital  status: Divorced    Spouse name: N/A  . Number of children: N/A  . Years of education: N/A   Occupational History  . Not on file.   Social History Main Topics  . Smoking status: Current Every Day Smoker    Packs/day: 1.00    Years: 30.00    Types: Cigarettes  . Smokeless tobacco: Never Used  . Alcohol use No  . Drug use: No  . Sexual activity: Yes   Other Topics Concern  . Not on file   Social History Narrative  . No narrative on file     Current Outpatient Prescriptions:  .  ALPRAZolam (XANAX) 0.5 MG tablet, Take 1 tablet (0.5 mg total) by mouth 2 (two) times daily as needed for anxiety., Disp: 60 tablet, Rfl: 0 .  Aspirin-Salicylamide-Caffeine (BC HEADACHE POWDER PO), Take by mouth. Reported on 07/18/2016, Disp: , Rfl:  .  calcipotriene (DOVONOX) 0.005 % cream, apply to affected area once daily to twice a day if needed for ps...  (REFER TO PRESCRIPTION NOTES)., Disp: , Rfl: 0 .  cyclobenzaprine (FLEXERIL) 5 MG tablet, Take 1 tablet (5 mg total) by mouth at bedtime., Disp: 30 tablet, Rfl: 0 .  lidocaine (LIDODERM) 5 %, Place 1 patch onto the skin daily. Remove & Discard patch within 12 hours or as directed by MD, Disp: 30 patch, Rfl: 0 .  Oxycodone HCl 10 MG TABS, Take 1 tablet (10 mg total)  by mouth 3 (three) times daily as needed., Disp: 90 tablet, Rfl: 0 .  polyethylene glycol (MIRALAX / GLYCOLAX) packet, Take 17 g by mouth daily., Disp: , Rfl:  .  pravastatin (PRAVACHOL) 40 MG tablet, take 1 tablet by mouth once daily, Disp: 90 tablet, Rfl: 1 .  rOPINIRole (REQUIP) 0.25 MG tablet, Take 2 tablets (0.5 mg total) by mouth at bedtime., Disp: 60 tablet, Rfl: 2 .  venlafaxine XR (EFFEXOR-XR) 37.5 MG 24 hr capsule, Take 1 capsule (37.5 mg total) by mouth daily with breakfast. (Patient not taking: Reported on 09/24/2017), Disp: 30 capsule, Rfl: 2  Allergies  Allergen Reactions  . Codeine Nausea And Vomiting  . Other Other (See Comments)    Powder in surgical gloves   . Tetanus  Toxoids Swelling     Review of Systems  Constitutional: Negative for fever.  Respiratory: Negative for shortness of breath.   Musculoskeletal: Positive for neck pain. Negative for myalgias.  Neurological: Negative for headaches.  Psychiatric/Behavioral: Negative for decreased concentration and suicidal ideas. The patient is nervous/anxious and has insomnia.       Objective  Vitals:   09/24/17 1149  BP: 110/68  Pulse: 86  Resp: 16  Temp: 98.2 F (36.8 C)  TempSrc: Oral  SpO2: 94%  Weight: 120 lb 4.8 oz (54.6 kg)  Height: 5\' 1"  (1.549 m)    Physical Exam  Constitutional: She is oriented to person, place, and time and well-developed, well-nourished, and in no distress.  HENT:  Head: Normocephalic and atraumatic.  Cardiovascular: Normal rate, regular rhythm and normal heart sounds.   No murmur heard. Pulmonary/Chest: Effort normal and breath sounds normal. She has no wheezes.  Abdominal: Soft. Bowel sounds are normal. There is no tenderness.  Musculoskeletal: She exhibits no edema.       Right ankle: She exhibits no swelling.       Left ankle: She exhibits no swelling.       Lumbar back: She exhibits tenderness.       Back:       Right lower leg: Normal.       Left lower leg: Normal.  Neurological: She is alert and oriented to person, place, and time.  Psychiatric: Mood, memory, affect and judgment normal.  Nursing note and vitals reviewed.     Assessment & Plan  1. Anxiety Stable, patient compliant with controlled substances agreement, refills provided - ALPRAZolam (XANAX) 0.5 MG tablet; Take 1 tablet (0.5 mg total) by mouth 2 (two) times daily as needed for anxiety.  Dispense: 60 tablet; Refill: 0  2. Chronic neck pain Symptoms of chronic neck pain are stable and responsive to opioid treatment, patient compliant with controlled substances agreement - Oxycodone HCl 10 MG TABS; Take 1 tablet (10 mg total) by mouth 3 (three) times daily as needed.  Dispense: 90  tablet; Refill: 0  3. Dyslipidemia  - pravastatin (PRAVACHOL) 40 MG tablet; Take 1 tablet (40 mg total) by mouth daily.  Dispense: 90 tablet; Refill: 1 - Lipid panel - COMPLETE METABOLIC PANEL WITH GFR   Annette Arnold [facility] 09/24/2017 11:58 AM

## 2017-09-25 DIAGNOSIS — R079 Chest pain, unspecified: Secondary | ICD-10-CM | POA: Diagnosis not present

## 2017-09-29 ENCOUNTER — Ambulatory Visit: Payer: Self-pay | Admitting: Family Medicine

## 2017-10-03 DIAGNOSIS — F17210 Nicotine dependence, cigarettes, uncomplicated: Secondary | ICD-10-CM | POA: Diagnosis present

## 2017-10-03 DIAGNOSIS — C641 Malignant neoplasm of right kidney, except renal pelvis: Secondary | ICD-10-CM | POA: Diagnosis not present

## 2017-10-03 DIAGNOSIS — N2889 Other specified disorders of kidney and ureter: Secondary | ICD-10-CM | POA: Diagnosis not present

## 2017-10-03 DIAGNOSIS — G8929 Other chronic pain: Secondary | ICD-10-CM | POA: Diagnosis present

## 2017-10-03 DIAGNOSIS — Z9049 Acquired absence of other specified parts of digestive tract: Secondary | ICD-10-CM | POA: Diagnosis not present

## 2017-10-03 DIAGNOSIS — Z79899 Other long term (current) drug therapy: Secondary | ICD-10-CM | POA: Diagnosis not present

## 2017-10-03 DIAGNOSIS — M549 Dorsalgia, unspecified: Secondary | ICD-10-CM | POA: Diagnosis present

## 2017-10-03 DIAGNOSIS — I4510 Unspecified right bundle-branch block: Secondary | ICD-10-CM | POA: Diagnosis present

## 2017-10-03 DIAGNOSIS — K5909 Other constipation: Secondary | ICD-10-CM | POA: Diagnosis present

## 2017-10-03 DIAGNOSIS — E7800 Pure hypercholesterolemia, unspecified: Secondary | ICD-10-CM | POA: Diagnosis present

## 2017-10-03 DIAGNOSIS — I959 Hypotension, unspecified: Secondary | ICD-10-CM | POA: Diagnosis not present

## 2017-10-21 DIAGNOSIS — C649 Malignant neoplasm of unspecified kidney, except renal pelvis: Secondary | ICD-10-CM | POA: Diagnosis not present

## 2017-10-24 ENCOUNTER — Encounter: Payer: Self-pay | Admitting: Family Medicine

## 2017-10-24 ENCOUNTER — Ambulatory Visit (INDEPENDENT_AMBULATORY_CARE_PROVIDER_SITE_OTHER): Payer: Medicare Other | Admitting: Family Medicine

## 2017-10-24 VITALS — BP 110/64 | HR 88 | Temp 97.7°F | Resp 16 | Ht 61.0 in | Wt 121.3 lb

## 2017-10-24 DIAGNOSIS — G8929 Other chronic pain: Secondary | ICD-10-CM

## 2017-10-24 DIAGNOSIS — C641 Malignant neoplasm of right kidney, except renal pelvis: Secondary | ICD-10-CM | POA: Diagnosis not present

## 2017-10-24 DIAGNOSIS — M542 Cervicalgia: Secondary | ICD-10-CM

## 2017-10-24 DIAGNOSIS — F419 Anxiety disorder, unspecified: Secondary | ICD-10-CM | POA: Diagnosis not present

## 2017-10-24 MED ORDER — ALPRAZOLAM 0.5 MG PO TABS: 0.5000 mg | ORAL_TABLET | Freq: Two times a day (BID) | ORAL | 0 refills | Status: DC | PRN

## 2017-10-24 MED ORDER — OXYCODONE HCL 10 MG PO TABS: 10.0000 mg | ORAL_TABLET | Freq: Three times a day (TID) | ORAL | 0 refills | Status: DC | PRN

## 2017-10-24 NOTE — Progress Notes (Signed)
Name: Annette Arnold   MRN: [id number]    DOB: July 30, 1961   Date:10/24/2017       Progress Note  Subjective  Chief Complaint  Chief Complaint  Patient presents with  . Medication Refill    xanax, and pain meds    Anxiety  Presents for follow-up visit. The problem has been unchanged. Symptoms include insomnia, malaise, muscle tension, nervous/anxious behavior and panic. Patient reports no depressed mood or excessive worry. Primary symptoms comment: worried about the mass in kidney, . The severity of symptoms is moderate and causing significant distress. The quality of sleep is fair.   Her past medical history is significant for anxiety/panic attacks. Past treatments include benzodiazephines. The treatment provided significant relief. Compliance with prior treatments has been good.  Neck Pain   This is a chronic problem. The problem occurs constantly. The problem has been unchanged. The quality of the pain is described as aching. The pain is at a severity of 3/10. The symptoms are aggravated by bending and position. Pertinent negatives include no fever, headaches or leg pain. She has tried oral narcotics for the symptoms. The treatment provided moderate relief.      Past Medical History:  Diagnosis Date  . Anxiety   . Chronic pain   . Hyperlipidemia     Past Surgical History:  Procedure Laterality Date  . ABDOMINAL HYSTERECTOMY    . APPENDECTOMY    . BREAST LUMPECTOMY    . CARPAL TUNNEL RELEASE    . CHOLECYSTECTOMY      Family History  Problem Relation Age of Onset  . Cancer Mother   . Diabetes Mother   . Thyroid disease Mother   . Heart disease Father   . Alcohol abuse Father   . Drug abuse Sister   . Paranoid behavior Sister   . Prostate cancer Neg Hx   . Kidney cancer Neg Hx     Social History   Social History  . Marital status: Divorced    Spouse name: N/A  . Number of children: N/A  . Years of education: N/A   Occupational History  . Not on file.    Social History Main Topics  . Smoking status: Current Every Day Smoker    Packs/day: 1.00    Years: 30.00    Types: Cigarettes  . Smokeless tobacco: Never Used  . Alcohol use No  . Drug use: No  . Sexual activity: Yes   Other Topics Concern  . Not on file   Social History Narrative  . No narrative on file     Current Outpatient Prescriptions:  .  ALPRAZolam (XANAX) 0.5 MG tablet, Take 1 tablet (0.5 mg total) by mouth 2 (two) times daily as needed for anxiety., Disp: 60 tablet, Rfl: 0 .  Aspirin-Salicylamide-Caffeine (BC HEADACHE POWDER PO), Take by mouth. Reported on 07/18/2016, Disp: , Rfl:  .  calcipotriene (DOVONOX) 0.005 % cream, apply to affected area once daily to twice a day if needed for ps...  (REFER TO PRESCRIPTION NOTES)., Disp: , Rfl: 0 .  cyclobenzaprine (FLEXERIL) 5 MG tablet, Take 1 tablet (5 mg total) by mouth at bedtime., Disp: 30 tablet, Rfl: 0 .  lidocaine (LIDODERM) 5 %, Place 1 patch onto the skin daily. Remove & Discard patch within 12 hours or as directed by MD, Disp: 30 patch, Rfl: 0 .  Oxycodone HCl 10 MG TABS, Take 1 tablet (10 mg total) by mouth 3 (three) times daily as needed., Disp: 90 tablet, Rfl: 0 .  polyethylene glycol (MIRALAX / GLYCOLAX) packet, Take 17 g by mouth daily., Disp: , Rfl:  .  pravastatin (PRAVACHOL) 40 MG tablet, Take 1 tablet (40 mg total) by mouth daily., Disp: 90 tablet, Rfl: 1 .  rOPINIRole (REQUIP) 0.25 MG tablet, Take 2 tablets (0.5 mg total) by mouth at bedtime., Disp: 60 tablet, Rfl: 2 .  venlafaxine XR (EFFEXOR-XR) 37.5 MG 24 hr capsule, Take 1 capsule (37.5 mg total) by mouth daily with breakfast. (Patient not taking: Reported on 09/24/2017), Disp: 30 capsule, Rfl: 2  Allergies  Allergen Reactions  . Codeine Nausea And Vomiting  . Other Other (See Comments)    Powder in surgical gloves   . Tetanus Toxoids Swelling     Review of Systems  Constitutional: Negative for fever.  Musculoskeletal: Positive for neck pain.   Neurological: Negative for headaches.  Psychiatric/Behavioral: The patient is nervous/anxious and has insomnia.      Objective  Vitals:   10/24/17 1140  BP: 110/64  Pulse: 88  Resp: 16  Temp: 97.7 F (36.5 C)  TempSrc: Oral  SpO2: 97%  Weight: 121 lb 4.8 oz (55 kg)  Height: 5\' 1"  (1.549 m)    Physical Exam  Constitutional: She is oriented to person, place, and time and well-developed, well-nourished, and in no distress.  HENT:  Head: Normocephalic and atraumatic.  Cardiovascular: Normal rate, regular rhythm and normal heart sounds.   No murmur heard. Pulmonary/Chest: Effort normal and breath sounds normal. She has no wheezes.  Musculoskeletal:       Cervical back: She exhibits tenderness, pain and spasm.       Back:  Neurological: She is alert and oriented to person, place, and time.  Psychiatric: Mood, memory, affect and judgment normal.  Nursing note and vitals reviewed.    Assessment & Plan  1. Anxiety Symptoms of anxiety are stable on alprazolam, patient compliant with controlled substances agreement, explained that after my departure from practice, she will either have to see a new primary care physician or be referred to a psychiatrist for continued management of anxiety and prescription for alprazolam. She verbalized agreement and will inform in due time - ALPRAZolam (XANAX) 0.5 MG tablet; Take 1 tablet (0.5 mg total) by mouth 2 (two) times daily as needed for anxiety.  Dispense: 60 tablet; Refill: 0  2. Chronic neck pain Stable and responsive to opioid treatment, patient compliant with controlled substances agreement and understands the dependence potential, side effects and drug interactions of opioids especially with benzodiazepines. Refills provided, explained that after my departure from the practice she will have to be referred to a pain clinic or to find a new primary care provider for continued management and prescription treatment with opioids and she  verbalized agreement. - Oxycodone HCl 10 MG TABS; Take 1 tablet (10 mg total) by mouth 3 (three) times daily as needed.  Dispense: 90 tablet; Refill: 0  3. Renal cell carcinoma of right kidney [facility]) Status post right laparoscopic nephrectomy, records reviewed   Sebastyan Snodgrass [facility] 10/24/2017 11:52 AM

## 2017-10-24 NOTE — Progress Notes (Signed)
The following letter was provided to [name] during their visit today: ---------------------------------------  Dear valued [facility] Patient,  I am writing to share that as of February 13, 2018, I will no longer be seeing patients at [facility]. While it has been my privilege to care for you as a physician, I have decided to move outside of [place] to pursue other opportunities.  The staff at [facility] has been supportive of my decision and are supportive of any patients who have been under my care.  They will be happy to provide care to you and your family.  The office staff will do everything they can to ensure a seamless transition of care at [facility].  However if you are on any controlled substance medications (i.e. Pain medication or benzodiazepines), they will no longer be able to refill those medications, but we will be more than happy to refer you to a specialist.  [facility] will also assist you with the transfer of medical records should you wish to seek care elsewhere.  If you have any questions about your future care, you may call the office at [phone].  I have enjoyed getting to know my patients here and I wish you the very best.  Sincerely,  Rochel Brome, MD  ---------------------------------------  A written copy of this letter was given to the patient.  The patient verbalizes understanding of the letter, and does request referral to specialist or to new primary care provider.  This decision has been conveyed to Dr. Manuella Ghazi, who will place any appropriate referrals during today's visit.

## 2017-11-14 DIAGNOSIS — F17210 Nicotine dependence, cigarettes, uncomplicated: Secondary | ICD-10-CM | POA: Diagnosis not present

## 2017-11-14 DIAGNOSIS — R109 Unspecified abdominal pain: Secondary | ICD-10-CM | POA: Diagnosis not present

## 2017-11-14 DIAGNOSIS — R1030 Lower abdominal pain, unspecified: Secondary | ICD-10-CM | POA: Diagnosis not present

## 2017-11-14 DIAGNOSIS — L03311 Cellulitis of abdominal wall: Secondary | ICD-10-CM | POA: Diagnosis not present

## 2017-11-18 DIAGNOSIS — N2889 Other specified disorders of kidney and ureter: Secondary | ICD-10-CM | POA: Diagnosis not present

## 2017-11-25 ENCOUNTER — Encounter: Payer: Self-pay | Admitting: Family Medicine

## 2017-11-25 ENCOUNTER — Ambulatory Visit (INDEPENDENT_AMBULATORY_CARE_PROVIDER_SITE_OTHER): Payer: Medicare Other | Admitting: Family Medicine

## 2017-11-25 DIAGNOSIS — F419 Anxiety disorder, unspecified: Secondary | ICD-10-CM | POA: Diagnosis not present

## 2017-11-25 DIAGNOSIS — G8929 Other chronic pain: Secondary | ICD-10-CM | POA: Diagnosis not present

## 2017-11-25 DIAGNOSIS — M542 Cervicalgia: Secondary | ICD-10-CM

## 2017-11-25 MED ORDER — ALPRAZOLAM 0.5 MG PO TABS: 0.5000 mg | ORAL_TABLET | Freq: Two times a day (BID) | ORAL | 0 refills | Status: DC | PRN

## 2017-11-25 MED ORDER — OXYCODONE HCL 10 MG PO TABS: 10.0000 mg | ORAL_TABLET | Freq: Three times a day (TID) | ORAL | 0 refills | Status: DC | PRN

## 2017-11-25 NOTE — Progress Notes (Signed)
Name: Annette Arnold   MRN: [id number]    DOB: 1961-10-05   Date:11/25/2017       Progress Note  Subjective  Chief Complaint  Chief Complaint  Patient presents with  . Medication Refill    xanax and pain meds    Anxiety  Presents for follow-up visit. The problem has been unchanged. Symptoms include insomnia, malaise, muscle tension, nervous/anxious behavior and panic. Patient reports no depressed mood or excessive worry. The severity of symptoms is moderate and causing significant distress. The quality of sleep is fair.   Her past medical history is significant for anxiety/panic attacks. Past treatments include benzodiazephines. The treatment provided significant relief. Compliance with prior treatments has been good.  Neck Pain   This is a chronic problem. The problem occurs constantly. The problem has been unchanged. The quality of the pain is described as aching. The pain is at a severity of 3/10. The symptoms are aggravated by bending and position. Pertinent negatives include no fever or leg pain. She has tried oral narcotics for the symptoms. The treatment provided moderate relief.     Past Medical History:  Diagnosis Date  . Anxiety   . Chronic pain   . Hyperlipidemia     Past Surgical History:  Procedure Laterality Date  . ABDOMINAL HYSTERECTOMY    . APPENDECTOMY    . BREAST LUMPECTOMY    . CARPAL TUNNEL RELEASE    . CHOLECYSTECTOMY      Family History  Problem Relation Age of Onset  . Cancer Mother   . Diabetes Mother   . Thyroid disease Mother   . Heart disease Father   . Alcohol abuse Father   . Drug abuse Sister   . Paranoid behavior Sister   . Prostate cancer Neg Hx   . Kidney cancer Neg Hx     Social History   Socioeconomic History  . Marital status: Divorced    Spouse name: Not on file  . Number of children: Not on file  . Years of education: Not on file  . Highest education level: Not on file  Social Needs  . Financial resource strain: Not on  file  . Food insecurity - worry: Not on file  . Food insecurity - inability: Not on file  . Transportation needs - medical: Not on file  . Transportation needs - non-medical: Not on file  Occupational History  . Not on file  Tobacco Use  . Smoking status: Current Every Day Smoker    Packs/day: 1.00    Years: 30.00    Pack years: 30.00    Types: Cigarettes  . Smokeless tobacco: Never Used  Substance and Sexual Activity  . Alcohol use: No  . Drug use: No  . Sexual activity: Yes  Other Topics Concern  . Not on file  Social History Narrative  . Not on file     Current Outpatient Medications:  .  ALPRAZolam (XANAX) 0.5 MG tablet, Take 1 tablet (0.5 mg total) by mouth 2 (two) times daily as needed for anxiety., Disp: 60 tablet, Rfl: 0 .  Aspirin-Salicylamide-Caffeine (BC HEADACHE POWDER PO), Take by mouth. Reported on 07/18/2016, Disp: , Rfl:  .  calcipotriene (DOVONOX) 0.005 % cream, apply to affected area once daily to twice a day if needed for ps...  (REFER TO PRESCRIPTION NOTES)., Disp: , Rfl: 0 .  cyclobenzaprine (FLEXERIL) 5 MG tablet, Take 1 tablet (5 mg total) by mouth at bedtime., Disp: 30 tablet, Rfl: 0 .  lidocaine (  LIDODERM) 5 %, Place 1 patch onto the skin daily. Remove & Discard patch within 12 hours or as directed by MD, Disp: 30 patch, Rfl: 0 .  Oxycodone HCl 10 MG TABS, Take 1 tablet (10 mg total) by mouth 3 (three) times daily as needed., Disp: 90 tablet, Rfl: 0 .  polyethylene glycol (MIRALAX / GLYCOLAX) packet, Take 17 g by mouth daily., Disp: , Rfl:  .  pravastatin (PRAVACHOL) 40 MG tablet, Take 1 tablet (40 mg total) by mouth daily., Disp: 90 tablet, Rfl: 1 .  rOPINIRole (REQUIP) 0.25 MG tablet, Take 2 tablets (0.5 mg total) by mouth at bedtime., Disp: 60 tablet, Rfl: 2 .  venlafaxine XR (EFFEXOR-XR) 37.5 MG 24 hr capsule, Take 1 capsule (37.5 mg total) by mouth daily with breakfast. (Patient not taking: Reported on 11/25/2017), Disp: 30 capsule, Rfl: 2  Allergies    Allergen Reactions  . Codeine Nausea And Vomiting  . Other Other (See Comments)    Powder in surgical gloves   . Tetanus Toxoids Swelling     Review of Systems  Constitutional: Negative for chills, fever and malaise/fatigue.  Musculoskeletal: Positive for back pain and neck pain.  Psychiatric/Behavioral: The patient is nervous/anxious and has insomnia.       Objective  Vitals:   11/25/17 1107  BP: 102/64  Pulse: 89  Resp: 16  Temp: 98.4 F (36.9 C)  TempSrc: Oral  SpO2: 96%  Weight: 121 lb 1.6 oz (54.9 kg)  Height: 5\' 1"  (1.549 m)    Physical Exam  Constitutional: She is oriented to person, place, and time and well-developed, well-nourished, and in no distress.  HENT:  Head: Normocephalic and atraumatic.  Cardiovascular: Normal rate, regular rhythm and normal heart sounds.  No murmur heard. Pulmonary/Chest: Effort normal and breath sounds normal. She has no wheezes.  Abdominal: Soft. Bowel sounds are normal. There is no tenderness.  Musculoskeletal: She exhibits no edema.       Right ankle: She exhibits no swelling.       Left ankle: She exhibits no swelling.       Cervical back: She exhibits tenderness, pain and spasm.       Back:       Right lower leg: Normal.       Left lower leg: Normal.  Neurological: She is alert and oriented to person, place, and time.  Psychiatric: Mood, memory, affect and judgment normal.  Nursing note and vitals reviewed.      Assessment & Plan  1. Anxiety Symptoms of anxiety are stable and responsive to alprazolam taken twice a day when necessary, patient compliant with controlled substances agreement, understands the dependence potential, side effects and drug interactions of opioids and benzodiazepines, refills provided - ALPRAZolam (XANAX) 0.5 MG tablet; Take 1 tablet (0.5 mg total) by mouth 2 (two) times daily as needed for anxiety.  Dispense: 60 tablet; Refill: 0  2. Chronic neck pain Chronic neck pain, responsive to  oxycodone taken up to 3 times daily as needed, compliant with controlled substances agreement, refills provided - Oxycodone HCl 10 MG TABS; Take 1 tablet (10 mg total) by mouth 3 (three) times daily as needed.  Dispense: 90 tablet; Refill: 0    Meagan Spease Asad A. [facility] 11/25/2017 11:32 AM

## 2017-12-22 ENCOUNTER — Encounter: Payer: Self-pay | Admitting: Family Medicine

## 2017-12-22 ENCOUNTER — Ambulatory Visit (INDEPENDENT_AMBULATORY_CARE_PROVIDER_SITE_OTHER): Payer: Medicare Other | Admitting: Family Medicine

## 2017-12-22 DIAGNOSIS — M542 Cervicalgia: Secondary | ICD-10-CM | POA: Diagnosis not present

## 2017-12-22 DIAGNOSIS — G8929 Other chronic pain: Secondary | ICD-10-CM | POA: Diagnosis not present

## 2017-12-22 DIAGNOSIS — F419 Anxiety disorder, unspecified: Secondary | ICD-10-CM

## 2017-12-22 MED ORDER — ALPRAZOLAM 0.5 MG PO TABS: 0.5000 mg | ORAL_TABLET | Freq: Two times a day (BID) | ORAL | 0 refills | Status: DC | PRN

## 2017-12-22 MED ORDER — OXYCODONE HCL 10 MG PO TABS: 10.0000 mg | ORAL_TABLET | Freq: Three times a day (TID) | ORAL | 0 refills | Status: DC | PRN

## 2017-12-22 NOTE — Progress Notes (Signed)
Name: Annette Arnold   MRN: [id number]    DOB: 1961-10-12   Date:12/22/2017       Progress Note  Subjective  Chief Complaint  Chief Complaint  Patient presents with  . Follow-up    1 month F/U  . Anxiety    Needs refill  . Neck Pain    Intermittently    Anxiety  Presents for follow-up visit. Symptoms include depressed mood, excessive worry, nervous/anxious behavior and panic. Patient reports no insomnia, restlessness or shortness of breath. Primary symptoms comment: worried about her father who has recently been diagnosed with aggressive esophageal cancer. The severity of symptoms is moderate.    Neck Pain   This is a chronic problem. The problem has been unchanged. The quality of the pain is described as aching, burning and stabbing. The pain is at a severity of 2/10. The symptoms are aggravated by position. Stiffness is present all day. She has tried oral narcotics, muscle relaxants and neck support for the symptoms.    Past Medical History:  Diagnosis Date  . Anxiety   . Chronic pain   . Hyperlipidemia     Past Surgical History:  Procedure Laterality Date  . ABDOMINAL HYSTERECTOMY    . APPENDECTOMY    . BREAST LUMPECTOMY    . CARPAL TUNNEL RELEASE    . CHOLECYSTECTOMY      Family History  Problem Relation Age of Onset  . Cancer Mother   . Diabetes Mother   . Thyroid disease Mother   . Heart disease Father   . Alcohol abuse Father   . Drug abuse Sister   . Paranoid behavior Sister   . Prostate cancer Neg Hx   . Kidney cancer Neg Hx     Social History   Socioeconomic History  . Marital status: Divorced    Spouse name: Not on file  . Number of children: Not on file  . Years of education: Not on file  . Highest education level: Not on file  Social Needs  . Financial resource strain: Not on file  . Food insecurity - worry: Not on file  . Food insecurity - inability: Not on file  . Transportation needs - medical: Not on file  . Transportation needs -  non-medical: Not on file  Occupational History  . Not on file  Tobacco Use  . Smoking status: Current Every Day Smoker    Packs/day: 1.00    Years: 30.00    Pack years: 30.00    Types: Cigarettes  . Smokeless tobacco: Never Used  Substance and Sexual Activity  . Alcohol use: No  . Drug use: No  . Sexual activity: Yes  Other Topics Concern  . Not on file  Social History Narrative  . Not on file     Current Outpatient Medications:  .  ALPRAZolam (XANAX) 0.5 MG tablet, Take 1 tablet (0.5 mg total) by mouth 2 (two) times daily as needed for anxiety., Disp: 60 tablet, Rfl: 0 .  Aspirin-Salicylamide-Caffeine (BC HEADACHE PO), Take by mouth as needed., Disp: , Rfl:  .  cyclobenzaprine (FLEXERIL) 5 MG tablet, Take 1 tablet (5 mg total) by mouth at bedtime., Disp: 30 tablet, Rfl: 0 .  Emollient (AQUAPHOR EX), Apply topically., Disp: , Rfl:  .  lidocaine (LIDODERM) 5 %, Place 1 patch onto the skin daily. Remove & Discard patch within 12 hours or as directed by MD, Disp: 30 patch, Rfl: 0 .  Oxycodone HCl 10 MG TABS, Take 1 tablet (  10 mg total) by mouth 3 (three) times daily as needed., Disp: 90 tablet, Rfl: 0 .  polyethylene glycol (MIRALAX / GLYCOLAX) packet, Take 17 g by mouth daily., Disp: , Rfl:  .  pravastatin (PRAVACHOL) 40 MG tablet, Take 1 tablet (40 mg total) by mouth daily., Disp: 90 tablet, Rfl: 1 .  rOPINIRole (REQUIP) 0.25 MG tablet, Take 2 tablets (0.5 mg total) by mouth at bedtime., Disp: 60 tablet, Rfl: 2 .  calcipotriene (DOVONOX) 0.005 % cream, apply to affected area once daily to twice a day if needed for ps...  (REFER TO PRESCRIPTION NOTES)., Disp: , Rfl: 0 .  venlafaxine XR (EFFEXOR-XR) 37.5 MG 24 hr capsule, Take 1 capsule (37.5 mg total) by mouth daily with breakfast. (Patient not taking: Reported on 11/25/2017), Disp: 30 capsule, Rfl: 2  Allergies  Allergen Reactions  . Codeine Nausea And Vomiting  . Other Other (See Comments)    Powder in surgical gloves   .  Tetanus Toxoids Swelling     Review of Systems  Respiratory: Negative for shortness of breath.   Musculoskeletal: Positive for neck pain.  Psychiatric/Behavioral: The patient is nervous/anxious. The patient does not have insomnia.       Objective  Vitals:   12/22/17 0943  BP: 102/60  Pulse: 88  Resp: 18  Temp: 98 F (36.7 C)  TempSrc: Oral  SpO2: 93%  Weight: 123 lb 4.8 oz (55.9 kg)  Height: 5\' 1"  (1.549 m)    Physical Exam  Constitutional: She is oriented to person, place, and time and well-developed, well-nourished, and in no distress.  Cardiovascular: Normal rate, regular rhythm and normal heart sounds.  No murmur heard. Pulmonary/Chest: Effort normal and breath sounds normal. She has no wheezes.  Musculoskeletal:       Cervical back: She exhibits tenderness, pain and spasm.       Back:  Neurological: She is alert and oriented to person, place, and time.  Psychiatric: Mood, memory, affect and judgment normal.  Nursing note and vitals reviewed.   Assessment & Plan  1. Anxiety Stable although marginally worse because of her father's life threatening illness, patient advised of my impending departure from practice and the need to find a new PCP and or a psychiatrist for management of anxiety and she verbalized agreement - ALPRAZolam (XANAX) 0.5 MG tablet; Take 1 tablet (0.5 mg total) by mouth 2 (two) times daily as needed for anxiety.  Dispense: 60 tablet; Refill: 0  2. Chronic neck pain Stable, advised of my departure from practice and the need to find a new PCP for management of chronic pain, refills provided - Oxycodone HCl 10 MG TABS; Take 1 tablet (10 mg total) by mouth 3 (three) times daily as needed.  Dispense: 90 tablet; Refill: 0   Ewen Varnell Asad A. [facility] 12/22/2017 9:53 AM

## 2017-12-25 ENCOUNTER — Ambulatory Visit: Payer: Self-pay | Admitting: Family Medicine

## 2017-12-31 ENCOUNTER — Ambulatory Visit: Payer: Self-pay | Admitting: Family Medicine

## 2018-01-20 DIAGNOSIS — C649 Malignant neoplasm of unspecified kidney, except renal pelvis: Secondary | ICD-10-CM | POA: Diagnosis not present

## 2018-01-20 DIAGNOSIS — C641 Malignant neoplasm of right kidney, except renal pelvis: Secondary | ICD-10-CM | POA: Diagnosis not present

## 2018-01-20 DIAGNOSIS — Z905 Acquired absence of kidney: Secondary | ICD-10-CM | POA: Diagnosis not present

## 2018-01-22 ENCOUNTER — Encounter: Payer: Self-pay | Admitting: Family Medicine

## 2018-01-22 ENCOUNTER — Ambulatory Visit (INDEPENDENT_AMBULATORY_CARE_PROVIDER_SITE_OTHER): Payer: Medicare Other | Admitting: Family Medicine

## 2018-01-22 DIAGNOSIS — M542 Cervicalgia: Secondary | ICD-10-CM | POA: Diagnosis not present

## 2018-01-22 DIAGNOSIS — G8929 Other chronic pain: Secondary | ICD-10-CM

## 2018-01-22 DIAGNOSIS — F419 Anxiety disorder, unspecified: Secondary | ICD-10-CM

## 2018-01-22 MED ORDER — ALPRAZOLAM 0.5 MG PO TABS: 0.5000 mg | ORAL_TABLET | Freq: Two times a day (BID) | ORAL | 0 refills | Status: DC | PRN

## 2018-01-22 MED ORDER — OXYCODONE HCL 10 MG PO TABS: 10.0000 mg | ORAL_TABLET | Freq: Three times a day (TID) | ORAL | 0 refills | Status: AC | PRN

## 2018-01-22 NOTE — Progress Notes (Signed)
Name: Annette Arnold   MRN: [id number]    DOB: September 22, 1961   Date:01/22/2018       Progress Note  Subjective  Chief Complaint  Chief Complaint  Patient presents with  . Follow-up  . Medication Refill    Anxiety  Presents for follow-up visit. Symptoms include depressed mood, excessive worry, insomnia, irritability and nervous/anxious behavior. Patient reports no shortness of breath. Primary symptoms comment: under a lot of stress recently because her father passed away. The severity of symptoms is causing significant distress and moderate. The quality of sleep is fair.    Neck Pain   This is a chronic problem. The problem has been unchanged. The pain is present in the left side and midline. The pain is at a severity of 3/10. The pain is moderate. Stiffness is present in the morning and all day. Pertinent negatives include no fever, headaches or weakness. She has tried oral narcotics (she was given information on PCPs accepting new patient in the area and for Pain management to follow up on chronic pain) for the symptoms.     Past Medical History:  Diagnosis Date  . Anxiety   . Chronic pain   . Hyperlipidemia     Past Surgical History:  Procedure Laterality Date  . ABDOMINAL HYSTERECTOMY    . APPENDECTOMY    . BREAST LUMPECTOMY    . CARPAL TUNNEL RELEASE    . CHOLECYSTECTOMY      Family History  Problem Relation Age of Onset  . Cancer Mother   . Diabetes Mother   . Thyroid disease Mother   . Heart disease Father   . Alcohol abuse Father   . Drug abuse Sister   . Paranoid behavior Sister   . Prostate cancer Neg Hx   . Kidney cancer Neg Hx     Social History   Socioeconomic History  . Marital status: Divorced    Spouse name: Not on file  . Number of children: Not on file  . Years of education: Not on file  . Highest education level: Not on file  Social Needs  . Financial resource strain: Not on file  . Food insecurity - worry: Not on file  . Food insecurity -  inability: Not on file  . Transportation needs - medical: Not on file  . Transportation needs - non-medical: Not on file  Occupational History  . Not on file  Tobacco Use  . Smoking status: Current Every Day Smoker    Packs/day: 1.00    Years: 30.00    Pack years: 30.00    Types: Cigarettes  . Smokeless tobacco: Never Used  Substance and Sexual Activity  . Alcohol use: No  . Drug use: No  . Sexual activity: Yes  Other Topics Concern  . Not on file  Social History Narrative  . Not on file     Current Outpatient Medications:  .  ALPRAZolam (XANAX) 0.5 MG tablet, Take 1 tablet (0.5 mg total) by mouth 2 (two) times daily as needed for anxiety., Disp: 60 tablet, Rfl: 0 .  Aspirin-Salicylamide-Caffeine (BC HEADACHE PO), Take by mouth as needed., Disp: , Rfl:  .  calcipotriene (DOVONOX) 0.005 % cream, apply to affected area once daily to twice a day if needed for ps...  (REFER TO PRESCRIPTION NOTES)., Disp: , Rfl: 0 .  cyclobenzaprine (FLEXERIL) 5 MG tablet, Take 1 tablet (5 mg total) by mouth at bedtime., Disp: 30 tablet, Rfl: 0 .  Emollient (AQUAPHOR EX), Apply topically.,  Disp: , Rfl:  .  lidocaine (LIDODERM) 5 %, Place 1 patch onto the skin daily. Remove & Discard patch within 12 hours or as directed by MD, Disp: 30 patch, Rfl: 0 .  Oxycodone HCl 10 MG TABS, Take 1 tablet (10 mg total) by mouth 3 (three) times daily as needed., Disp: 90 tablet, Rfl: 0 .  polyethylene glycol (MIRALAX / GLYCOLAX) packet, Take 17 g by mouth daily., Disp: , Rfl:  .  pravastatin (PRAVACHOL) 40 MG tablet, Take 1 tablet (40 mg total) by mouth daily., Disp: 90 tablet, Rfl: 1 .  rOPINIRole (REQUIP) 0.25 MG tablet, Take 2 tablets (0.5 mg total) by mouth at bedtime., Disp: 60 tablet, Rfl: 2 .  venlafaxine XR (EFFEXOR-XR) 37.5 MG 24 hr capsule, Take 1 capsule (37.5 mg total) by mouth daily with breakfast., Disp: 30 capsule, Rfl: 2  Allergies  Allergen Reactions  . Codeine Nausea And Vomiting  . Other Other  (See Comments)    Powder in surgical gloves   . Tetanus Toxoids Swelling     Review of Systems  Constitutional: Positive for irritability. Negative for fever.  Respiratory: Negative for shortness of breath.   Musculoskeletal: Positive for neck pain.  Neurological: Negative for weakness and headaches.  Psychiatric/Behavioral: The patient is nervous/anxious and has insomnia.      Objective  Vitals:   01/22/18 1051  BP: (!) 104/58  Pulse: 84  Resp: 16  Temp: 98.4 F (36.9 C)  TempSrc: Oral  SpO2: 95%  Weight: 125 lb 12.8 oz (57.1 kg)  Height: 5\' 1"  (1.549 m)    Physical Exam  Constitutional: She is well-developed, well-nourished, and in no distress.  HENT:  Head: Normocephalic and atraumatic.  Cardiovascular: Normal rate, regular rhythm and normal heart sounds.  No murmur heard. Pulmonary/Chest: Effort normal and breath sounds normal. She has no wheezes.  Musculoskeletal:       Cervical back: She exhibits tenderness, pain and spasm.       Back:  Nursing note and vitals reviewed.      Assessment & Plan  1. Anxiety Explained that she will need to see either a new provider who is willing to prescribe alprazolam or be referred to a psychiatrist, provided information on behavioral health and PCP providers in the area, refill for alprazolam provided - ALPRAZolam (XANAX) 0.5 MG tablet; Take 1 tablet (0.5 mg total) by mouth 2 (two) times daily as needed for anxiety.  Dispense: 60 tablet; Refill: 0  2. Chronic neck pain Age and was referred to pain clinic but has not seen pain management as of yet, she has chronic cervical spine pain, explained that she will need to see a pain doctor for continuation of opioid treatment, provided information on the specialist in the area. - Oxycodone HCl 10 MG TABS; Take 1 tablet (10 mg total) by mouth 3 (three) times daily as needed.  Dispense: 90 tablet; Refill: 0   Amybeth Sieg Asad A. [facility] 01/22/2018 11:14 AM

## 2018-02-04 ENCOUNTER — Telehealth: Payer: Self-pay | Admitting: Family Medicine

## 2018-02-04 ENCOUNTER — Telehealth: Payer: Self-pay

## 2018-02-04 DIAGNOSIS — G8929 Other chronic pain: Principal | ICD-10-CM

## 2018-02-04 DIAGNOSIS — M542 Cervicalgia: Secondary | ICD-10-CM

## 2018-02-04 DIAGNOSIS — M549 Dorsalgia, unspecified: Principal | ICD-10-CM

## 2018-02-04 NOTE — Telephone Encounter (Signed)
Referral to pain clinic has been entered

## 2018-02-04 NOTE — Telephone Encounter (Signed)
I do not see where a referral has been placed for a pain specialist.

## 2018-02-04 NOTE — Telephone Encounter (Signed)
Copied from [place] [phone]. Topic: General - Other >> Feb 04, 2018  3:25 PM Lolita Rieger, [place] wrote: Reason for CRM: pt called again requesting a referral to a pain clinic before Manuella Ghazi leaves and has not hard anything pt would like a call back once the referral has been placed [phone]

## 2018-02-04 NOTE — Telephone Encounter (Signed)
Copied from [place] [phone]. Topic: General - Other >> Feb 04, 2018  3:25 PM Lolita Rieger, [place] wrote: Reason for CRM: pt called again requesting a referral to a pain clinic before Manuella Ghazi leaves and has not hard anything pt would like a call back once the referral has been placed [phone]

## 2018-02-05 NOTE — Telephone Encounter (Signed)
Referral has been sent to [place] for Dr. Holley Raring to review.

## 2018-02-18 ENCOUNTER — Other Ambulatory Visit: Payer: Self-pay | Admitting: Family Medicine

## 2018-02-18 DIAGNOSIS — G8929 Other chronic pain: Secondary | ICD-10-CM

## 2018-02-18 DIAGNOSIS — M542 Cervicalgia: Principal | ICD-10-CM

## 2018-02-18 NOTE — Telephone Encounter (Signed)
Dr. Manuella Ghazi is no longer here Letter was supposed to go out months ago that the providers here in the practice were not going to continue to prescribe pain medicine or benzodiazepines for his patients, and that patients would need to get that from a pain clinic or psychiatrist or other provider  If she is concerned about withdrawal, she is welcome to seek help at [facility] or any other service:  [facility] [address] [place],  [address] Phone: [phone] Fax: [phone]

## 2018-02-18 NOTE — Telephone Encounter (Signed)
Pt.notified

## 2018-02-18 NOTE — Telephone Encounter (Signed)
Copied from [place] [phone]. Topic: Quick Communication - See Telephone Encounter >> Feb 18, 2018  9:52 AM Conception Chancy, [place] wrote: CRM for notification. See Telephone encounter for:  02/18/18.  Patient is calling and requesting a refill on oxycodone. She states she has a appt at the pain clinic on 02/24/18 but they informed her they will not prescribe pain medications for the first couple visits. They informed patient she needed to contact her office and request a refill.  [place], [place]

## 2018-02-24 ENCOUNTER — Telehealth: Payer: Self-pay

## 2018-02-24 ENCOUNTER — Other Ambulatory Visit: Payer: Self-pay

## 2018-02-24 ENCOUNTER — Ambulatory Visit
Payer: Medicare Other | Attending: Student in an Organized Health Care Education/Training Program | Admitting: Student in an Organized Health Care Education/Training Program

## 2018-02-24 ENCOUNTER — Encounter: Payer: Self-pay | Admitting: Student in an Organized Health Care Education/Training Program

## 2018-02-24 VITALS — BP 121/70 | HR 100 | Temp 97.8°F | Resp 16 | Ht 61.0 in | Wt 123.0 lb

## 2018-02-24 DIAGNOSIS — F17210 Nicotine dependence, cigarettes, uncomplicated: Secondary | ICD-10-CM | POA: Insufficient documentation

## 2018-02-24 DIAGNOSIS — Z79899 Other long term (current) drug therapy: Secondary | ICD-10-CM | POA: Diagnosis not present

## 2018-02-24 DIAGNOSIS — N289 Disorder of kidney and ureter, unspecified: Secondary | ICD-10-CM | POA: Diagnosis not present

## 2018-02-24 DIAGNOSIS — M419 Scoliosis, unspecified: Secondary | ICD-10-CM | POA: Diagnosis not present

## 2018-02-24 DIAGNOSIS — E538 Deficiency of other specified B group vitamins: Secondary | ICD-10-CM | POA: Diagnosis not present

## 2018-02-24 DIAGNOSIS — Z7982 Long term (current) use of aspirin: Secondary | ICD-10-CM | POA: Diagnosis not present

## 2018-02-24 DIAGNOSIS — M542 Cervicalgia: Secondary | ICD-10-CM | POA: Diagnosis not present

## 2018-02-24 DIAGNOSIS — G894 Chronic pain syndrome: Secondary | ICD-10-CM

## 2018-02-24 DIAGNOSIS — L409 Psoriasis, unspecified: Secondary | ICD-10-CM | POA: Insufficient documentation

## 2018-02-24 DIAGNOSIS — E785 Hyperlipidemia, unspecified: Secondary | ICD-10-CM | POA: Insufficient documentation

## 2018-02-24 DIAGNOSIS — Z79891 Long term (current) use of opiate analgesic: Secondary | ICD-10-CM | POA: Diagnosis not present

## 2018-02-24 DIAGNOSIS — E559 Vitamin D deficiency, unspecified: Secondary | ICD-10-CM | POA: Insufficient documentation

## 2018-02-24 DIAGNOSIS — H0289 Other specified disorders of eyelid: Secondary | ICD-10-CM | POA: Diagnosis not present

## 2018-02-24 DIAGNOSIS — F419 Anxiety disorder, unspecified: Secondary | ICD-10-CM | POA: Diagnosis not present

## 2018-02-24 DIAGNOSIS — M25551 Pain in right hip: Secondary | ICD-10-CM | POA: Insufficient documentation

## 2018-02-24 DIAGNOSIS — G8929 Other chronic pain: Secondary | ICD-10-CM | POA: Diagnosis not present

## 2018-02-24 DIAGNOSIS — M545 Low back pain: Secondary | ICD-10-CM | POA: Diagnosis not present

## 2018-02-24 NOTE — Progress Notes (Signed)
Safety precautions to be maintained throughout the outpatient stay will include: orient to surroundings, keep bed in low position, maintain call bell within reach at all times, provide assistance with transfer out of bed and ambulation.  

## 2018-02-24 NOTE — Progress Notes (Signed)
Patient's Name: Annette Arnold  MRN: [id number]  Referring Provider: Roselee Nova, MD  DOB: [birth year]  PCP: Roselee Nova, MD (Inactive)  DOS: 02/24/2018  Note by: Gillis Santa, MD  Service setting: Ambulatory outpatient  Specialty: Interventional Pain Management  Location: [facility] (AMB) Pain Management Facility  Visit type: Initial Patient Evaluation  Patient type: New Patient   Primary Reason(s) for Visit: Encounter for initial evaluation of one or more chronic problems (new to examiner) potentially causing chronic pain, and posing a threat to normal musculoskeletal function. (Level of risk: High) CC: Neck Pain (radiating) and Back Pain (lower)  HPI  Annette Arnold is a 57 y.o. year old, female patient, who comes today to see [place] for the first time for an initial evaluation of her chronic pain. She has Anxiety; Leg pain; Nerve root pain; Chronic LBP; Chronic pain associated with significant psychosocial dysfunction; Pain in shoulder; Continuous opioid dependence ([place]); Dyslipidemia; Psoriasis; Scoliosis (and kyphoscoliosis), idiopathic; Counseling on substance use and abuse; B12 deficiency; Vitamin D deficiency; Chronic neck and back pain; Chronic neck pain; Facial burning; Pain of right eyelid; Constipation; Chronic, continuous use of opioids; Post-traumatic stress reaction; Anxiety disorder, unspecified; Atypical chest pain; Chronic upper back pain; Chronic low back pain; Right hip pain; Hot flashes due to menopause; Acute pelvic pain; Well woman exam without gynecological exam; Renal mass, right; Common bile duct dilation; and Muscle spasms of neck on their problem list. Today she comes in for evaluation of her Neck Pain (radiating) and Back Pain (lower)  Pain Assessment: Location: Posterior Neck Radiating: radiates through periscapula areas bilaterally Onset: More than a month ago Duration: Chronic pain Quality: Aching, Stabbing Severity: 7 /10 (self-reported pain score)  Note: Reported  level is inconsistent with clinical observations. Clinically the patient looks like a 2/10 A 2/10 is viewed as "Mild to Moderate" and described as noticeable and distracting. Impossible to hide from other people. More frequent flare-ups. Still possible to adapt and function close to normal. It can be very annoying and may have occasional stronger flare-ups. With discipline, patients may get used to it and adapt.       When using our objective Pain Scale, levels between 6 and 10/10 are said to belong in an emergency room, as it progressively worsens from a 6/10, described as severely limiting, requiring emergency care not usually available at an outpatient pain management facility. At a 6/10 level, communication becomes difficult and requires great effort. Assistance to reach the emergency department may be required. Facial flushing and profuse sweating along with potentially dangerous increases in heart rate and blood pressure will be evident. Effect on ADL: pt states she does not go out like she used to, difficult to lift grandchildren, difficult to look up and reach to kitchen cabinets Timing: Intermittent Modifying factors: oxycodone  Onset and Duration: Present longer than 3 months Cause of pain: Work related accident or event Severity: Getting worse, NAS-11 at its worse: 7/10, NAS-11 at its best: 7/10 and NAS-11 now: 7/10 Timing: Morning, Night, During activity or exercise and After a period of immobility Aggravating Factors: Bending, Climbing, Lifiting, Motion, Prolonged sitting, Prolonged standing, Stooping , Walking and Walking uphill Alleviating Factors: Hot packs, Lying down, Medications and Resting Associated Problems: Constipation, Fatigue, Numbness, Spasms, Sweating, Tingling, Weakness, Pain that wakes patient up and Pain that does not allow patient to sleep Quality of Pain: Aching, Agonizing, Deep, Nagging, Sharp, Shooting, Stabbing, Tender, Tingling and Uncomfortable Previous Examinations  or Tests: CT scan, MRI  scan, X-rays and Nerve conduction test Previous Treatments: Narcotic medications and TENS  The patient comes into the clinics today for the first time for a chronic pain management evaluation.    57 year old female with a history of chronic benzodiazepine and opioid use/dependence who presents with a chief complaint of axial neck pain and bilateral periscapular pain.  Patient is being referred from her primary care physician who is moving.  She is here specifically requesting oxycodone.  Her current medications include Xanax 0.5 mg twice daily as needed which she usually takes twice daily along with oxycodone 10 mill grams 3 times daily as needed.  She has been on this regimen for greater than 4 years.  Other analgesics also include Flexeril 5 mg nightly and Effexor 37.5 mg daily.  Patient has a history of right radical nephrectomy in October 2018 for right renal cell carcinoma.  She also has significant anxiety.  Today I took the time to provide the patient with information regarding my pain practice. The patient was informed that my practice is divided into two sections: an interventional pain management section, as well as a completely separate and distinct medication management section. I explained that I have procedure days for my interventional therapies, and evaluation days for follow-ups and medication management. Because of the amount of documentation required during both, they are kept separated. This means that there is the possibility that she may be scheduled for a procedure on one day, and medication management the next. I have also informed her that because of staffing and facility limitations, I no longer take patients for medication management only. To illustrate the reasons for this, I gave the patient the example of surgeons, and how inappropriate it would be to refer a patient to his/her care, just to write for the post-surgical antibiotics on a surgery done by a  different surgeon.   Because interventional pain management is my board-certified specialty, the patient was informed that joining my practice means that they are open to any and all interventional therapies. I made it clear that this does not mean that they will be forced to have any procedures done. What this means is that I believe interventional therapies to be essential part of the diagnosis and proper management of chronic pain conditions. Therefore, patients not interested in these interventional alternatives will be better served under the care of a different practitioner.  The patient was also made aware of my Comprehensive Pain Management Safety Guidelines where by joining my practice, they limit all of their nerve blocks and joint injections to those done by our practice, for as long as we are retained to manage their care.   Historic Controlled Substance Pharmacotherapy Review  PMP and historical list of controlled substances: Oxycodone 10 mg 3 times daily as needed, quantity 89-month  Last fill 01/22/2018.  MME equals 45.  Xanax 0.5 mg twice daily as needed, quantity 696-month Last fill 01/22/2018.y Medications: The patient did not bring the medication(s) to the appointment, as requested in our "New Patient Package" Pharmacodynamics: Desired effects: Analgesia: The patient reports >50% benefit. Reported improvement in function: The patient reports medication allows her to accomplish basic ADLs. Clinically meaningful improvement in function (CMIF): Sustained CMIF goals met Perceived effectiveness: Described as relatively effective, allowing for increase in activities of daily living (ADL) Undesirable effects: Side-effects or Adverse reactions: None reported Historical Monitoring: The patient  reports that she does not use drugs. List of all UDS Test(s): No results found for: MDMA, COCAINSCRNUR, [place]  PCPQUANT, CANNABQUANT, [place], [place] List of other Serum/Urine Drug Screening  Test(s):  No results found for: AMPHSCRSER, BARBSCRSER, BENZOSCRSER, COCAINSCRSER, COCAINSCRNUR, PCPSCRSER, PCPQUANT, THCSCRSER, THCU, CANNABQUANT, OPIATESCRSER, OXYSCRSER, PROPOXSCRSER, ETH Historical Background Evaluation: [place] PMP: Six (6) year initial data search conducted.             [place] Department of public safety, offender search: Editor, commissioning Information) Non-contributory Risk Assessment Profile: Aberrant behavior: None observed or detected today Risk factors for fatal opioid overdose: age 42-8 years old, Benzodiazepine use, caucasian, concomitant use of Benzodiazepines and family history of addiction Fatal overdose hazard ratio (HR): 1.32 for 20-49 MME/day Non-fatal overdose hazard ratio (HR): 1.44 for 20-49 MME/day Risk of opioid abuse or dependence: 0.7-3.0% with doses ? 36 MME/day and 6.1-26% with doses ? 120 MME/day. Substance use disorder (SUD) risk level: Moderate Opioid risk tool (ORT) (Total Score): 3 Opioid Risk Tool - 02/24/18 0827      Psychological Disease   Depression  -- currently being treated for anxiety   currently being treated for anxiety     ORT Scoring interpretation table:  Score <3 = Low Risk for SUD  Score between 4-7 = Moderate Risk for SUD  Score >8 = High Risk for Opioid Abuse   PHQ-2 Depression Scale:  Total score: 0  PHQ-2 Scoring interpretation table: (Score and probability of major depressive disorder)  Score 0 = No depression  Score 1 = 15.4% Probability  Score 2 = 21.1% Probability  Score 3 = 38.4% Probability  Score 4 = 45.5% Probability  Score 5 = 56.4% Probability  Score 6 = 78.6% Probability   PHQ-9 Depression Scale:  Total score: 0  PHQ-9 Scoring interpretation table:  Score 0-4 = No depression  Score 5-9 = Mild depression  Score 10-14 = Moderate depression  Score 15-19 = Moderately severe depression  Score 20-27 = Severe depression (2.4 times higher risk of SUD and 2.89 times higher risk of overuse)   Pharmacologic Plan: No opioid  analgesics.            Initial impression: Poor candidate for opioid analgesics.  Meds   Current Outpatient Medications:  .  acetaminophen (TYLENOL) 160 MG/5ML liquid, Take 160 mg by mouth every 6 (six) hours as needed for fever., Disp: , Rfl:  .  ALPRAZolam (XANAX) 0.5 MG tablet, Take 1 tablet (0.5 mg total) by mouth 2 (two) times daily as needed for anxiety., Disp: 60 tablet, Rfl: 0 .  Aspirin-Salicylamide-Caffeine (BC HEADACHE PO), Take by mouth as needed., Disp: , Rfl:  .  calcipotriene (DOVONOX) 0.005 % cream, apply to affected area once daily to twice a day if needed for ps...  (REFER TO PRESCRIPTION NOTES)., Disp: , Rfl: 0 .  cyclobenzaprine (FLEXERIL) 5 MG tablet, Take 1 tablet (5 mg total) by mouth at bedtime., Disp: 30 tablet, Rfl: 0 .  Emollient (AQUAPHOR EX), Apply topically., Disp: , Rfl:  .  lidocaine (LIDODERM) 5 %, Place 1 patch onto the skin daily. Remove & Discard patch within 12 hours or as directed by MD, Disp: 30 patch, Rfl: 0 .  Oxycodone HCl 10 MG TABS, Take 1 tablet (10 mg total) by mouth 3 (three) times daily as needed., Disp: 90 tablet, Rfl: 0 .  polyethylene glycol (MIRALAX / GLYCOLAX) packet, Take 17 g by mouth daily., Disp: , Rfl:  .  pravastatin (PRAVACHOL) 40 MG tablet, Take 1 tablet (40 mg total) by mouth daily., Disp: 90 tablet, Rfl: 1 .  rOPINIRole (REQUIP) 0.25 MG tablet, Take  2 tablets (0.5 mg total) by mouth at bedtime. (Patient not taking: Reported on 02/24/2018), Disp: 60 tablet, Rfl: 2 .  venlafaxine XR (EFFEXOR-XR) 37.5 MG 24 hr capsule, Take 1 capsule (37.5 mg total) by mouth daily with breakfast. (Patient not taking: Reported on 02/24/2018), Disp: 30 capsule, Rfl: 2  Imaging Review  Cervical Imaging:  Cervical DG complete:  Results for orders placed during the hospital encounter of 06/20/16  DG Cervical Spine Complete   Narrative CLINICAL DATA:  Right-sided neck pain.  Scapular pain.  EXAM: CERVICAL SPINE - COMPLETE 4+ VIEW  COMPARISON:   11/09/2012  FINDINGS: There is no evidence of cervical spine fracture or prevertebral soft tissue swelling. Alignment is normal. No other significant bone abnormalities are identified.  IMPRESSION: Negative cervical spine radiographs.   Electronically Signed   By: Kathreen Devoid   On: 06/20/2016 14:21      Shoulder-R DG:  Results for orders placed during the hospital encounter of 06/20/16  DG Shoulder Right   Narrative CLINICAL DATA:  Right shoulder pain, no acute injury  EXAM: RIGHT SHOULDER - 2+ VIEW  COMPARISON:  None.  FINDINGS: The right humeral head is in normal position and the glenohumeral joint space appears normal. The subacromial space is somewhat narrowed which may indicate impingement. The right [facility] joint is normally aligned.  IMPRESSION: No acute abnormality.  Cannot exclude impingement.   Electronically Signed   By: Ivar Drape M.D.   On: 06/20/2016 14:21     Results for orders placed during the hospital encounter of 04/08/15  DG Knee 1-2 Views Left   Narrative   CLINICAL DATA:  Left knee and distal femur swelling starting 30 minutes ago. Sudden onset. Redness and swelling to the distal femur above the patella.  EXAM: LEFT KNEE - 1-2 VIEW  COMPARISON:  09/08/2013  FINDINGS: Mild swelling and infiltration in the subcutaneous fat over the suprapatellar region. This may indicate edema or cellulitis. No significant effusion. No evidence of acute fracture or dislocation in the left knee. No focal bone lesion or bone destruction. Cortical surfaces appear intact.  IMPRESSION: Soft tissue infiltration in the subcutaneous fat over the suprapatellar region. No acute bony abnormalities.   Electronically Signed   By: Lucienne Capers M.D.   On: 04/09/2015 00:54     Complexity Note: Imaging results reviewed. Results shared with Ms. Ratterman, using [employer].                         ROS  Cardiovascular History: No reported cardiovascular  signs or symptoms such as High blood pressure, coronary artery disease, abnormal heart rate or rhythm, heart attack, blood thinner therapy or heart weakness and/or failure Pulmonary or Respiratory History: Smoking Neurological History: Curved spine Review of Past Neurological Studies: No results found for this or any previous visit. Psychological-Psychiatric History: Anxiousness Gastrointestinal History: Irregular, infrequent bowel movements (Constipation) Genitourinary History: Kidney disease Hematological History: No reported hematological signs or symptoms such as prolonged bleeding, low or poor functioning platelets, bruising or bleeding easily, hereditary bleeding problems, low energy levels due to low hemoglobin or being anemic Endocrine History: No reported endocrine signs or symptoms such as high or low blood sugar, rapid heart rate due to high thyroid levels, obesity or weight gain due to slow thyroid or thyroid disease Rheumatologic History: No reported rheumatological signs and symptoms such as fatigue, joint pain, tenderness, swelling, redness, heat, stiffness, decreased range of motion, with or without associated rash Musculoskeletal  History: Negative for myasthenia gravis, muscular dystrophy, multiple sclerosis or malignant hyperthermia Work History: Disabled  Allergies  Ms. Roussell is allergic to codeine; other; and tetanus toxoids.  Laboratory Chemistry  Inflammation Markers (CRP: Acute Phase) (ESR: Chronic Phase) No results found for: CRP, ESRSEDRATE, LATICACIDVEN                       Rheumatology Markers No results found for: RF, ANA, Therisa [place], [facility]              Renal Function Markers Lab Results  Component Value Date   BUN 9 02/13/2017   CREATININE 0.68 02/13/2017   GFRAA >89 02/13/2017   GFRNONAA >89 02/13/2017                 Hepatic Function Markers Lab Results  Component Value Date   AST 17 02/13/2017   ALT 5 (L) 02/13/2017    ALBUMIN 4.0 02/13/2017   ALKPHOS 99 02/13/2017                 Electrolytes Lab Results  Component Value Date   NA 138 02/13/2017   K 4.2 02/13/2017   CL 106 02/13/2017   CALCIUM 9.5 02/13/2017   MG 2.0 01/09/2013                        Neuropathy Markers No results found for: VITAMINB12, FOLATE, HGBA1C, HIV               Bone Pathology Markers No results found for: VD25OH, VD125OH2TOT, [id number], QQ7619JK9, 25OHVITD1, 25OHVITD2, 25OHVITD3, TESTOFREE, TESTOSTERONE                       Coagulation Parameters Lab Results  Component Value Date   PLT 280 06/04/2017                 Cardiovascular Markers Lab Results  Component Value Date   CKTOTAL 65 01/09/2013   CKMB < 0.5 (L) 01/09/2013   TROPONINI < 0.02 01/09/2013   HGB 13.3 06/04/2017   HCT 40.8 06/04/2017                 CA Markers No results found for: CEA, CA125, LABCA2               Note: Lab results reviewed.  [place]  Drug: Ms. Kopper  reports that she does not use drugs. Alcohol:  reports that she does not drink alcohol. Tobacco:  reports that she has been smoking cigarettes.  She has a 30.00 pack-year smoking history. she has never used smokeless tobacco. Medical:  has a past medical history of Anxiety, Chronic pain, and Hyperlipidemia. Family: family history includes Alcohol abuse in her father; Cancer in her mother; Diabetes in her mother; Drug abuse in her sister; Heart disease in her father; Paranoid behavior in her sister; Thyroid disease in her mother.  Past Surgical History:  Procedure Laterality Date  . ABDOMINAL HYSTERECTOMY    . APPENDECTOMY    . BREAST LUMPECTOMY    . CARPAL TUNNEL RELEASE    . CHOLECYSTECTOMY    . NEPHRECTOMY Right    09/2017 @ [facility]   Active Ambulatory Problems    Diagnosis Date Noted  . Anxiety 06/02/2015  . Leg pain 06/02/2015  . Nerve root pain 06/02/2015  . Chronic LBP 06/02/2015  . Chronic pain associated with significant psychosocial dysfunction 06/02/2015  .  Pain in  shoulder 06/02/2015  . Continuous opioid dependence ([place]) 06/02/2015  . Dyslipidemia 06/02/2015  . Psoriasis 06/02/2015  . Scoliosis (and kyphoscoliosis), idiopathic 06/02/2015  . Counseling on substance use and abuse 06/02/2015  . B12 deficiency 06/02/2015  . Vitamin D deficiency 06/02/2015  . Chronic neck and back pain 06/06/2015  . Chronic neck pain 08/01/2015  . Facial burning 11/03/2015  . Pain of right eyelid 11/10/2015  . Constipation 02/29/2016  . Chronic, continuous use of opioids 03/14/2016  . Post-traumatic stress reaction 03/29/2016  . Anxiety disorder, unspecified 04/18/2016  . Atypical chest pain 03/03/2015  . Chronic upper back pain 06/19/2016  . Chronic low back pain 06/19/2016  . Right hip pain 06/19/2016  . Hot flashes due to menopause 01/13/2017  . Acute pelvic pain 01/13/2017  . Well woman exam without gynecological exam 02/03/2017  . Renal mass, right 02/26/2017  . Common bile duct dilation 02/26/2017  . Muscle spasms of neck 05/07/2017   Resolved Ambulatory Problems    Diagnosis Date Noted  . Acute pain of right hip 07/07/2015   Past Medical History:  Diagnosis Date  . Anxiety   . Chronic pain   . Hyperlipidemia    Constitutional Exam  General appearance: Well nourished, well developed, and well hydrated. In no apparent acute distress Vitals:   02/24/18 0807  BP: 121/70  Pulse: 100  Resp: 16  Temp: 97.8 F (36.6 C)  TempSrc: Oral  SpO2: 100%  Weight: 123 lb (55.8 kg)  Height: _0  (1.549 m)   BMI Assessment: Estimated body mass index is 23.24 kg/m as calculated from the following:   Height as of this encounter: _1  (1.549 m).   Weight as of this encounter: 123 lb (55.8 kg).  BMI interpretation table: BMI level Category Range association with higher incidence of chronic pain  <18 kg/m2 Underweight   18.5-24.9 kg/m2 Ideal body weight   25-29.9 kg/m2 Overweight Increased incidence by 20%  30-34.9 kg/m2 Obese (Class I)  Increased incidence by 68%  35-39.9 kg/m2 Severe obesity (Class II) Increased incidence by 136%  >40 kg/m2 Extreme obesity (Class III) Increased incidence by 254%   BMI Readings from Last 4 Encounters:  02/24/18 23.24 kg/m  01/22/18 23.77 kg/m  12/22/17 23.30 kg/m  11/25/17 22.88 kg/m   Wt Readings from Last 4 Encounters:  02/24/18 123 lb (55.8 kg)  01/22/18 125 lb 12.8 oz (57.1 kg)  12/22/17 123 lb 4.8 oz (55.9 kg)  11/25/17 121 lb 1.6 oz (54.9 kg)  Psych/Mental status: Alert, oriented x 3 (person, place, & time)       Eyes: PERLA Respiratory: No evidence of acute respiratory distress  Cervical Spine Area Exam  Skin & Axial Inspection: No masses, redness, edema, swelling, or associated skin lesions Alignment: Symmetrical Functional ROM: Decreased ROM      Stability: No instability detected Muscle Tone/Strength: Functionally intact. No obvious neuro-muscular anomalies detected. Sensory (Neurological): Movement-associated discomfort Palpation: Complains of area being tender to palpation              Upper Extremity (UE) Exam    Side: Right upper extremity  Side: Left upper extremity  Skin & Extremity Inspection: Skin color, temperature, and hair growth are WNL. No peripheral edema or cyanosis. No masses, redness, swelling, asymmetry, or associated skin lesions. No contractures.  Skin & Extremity Inspection: Skin color, temperature, and hair growth are WNL. No peripheral edema or cyanosis. No masses, redness, swelling, asymmetry, or associated skin lesions. No contractures.  Functional ROM: Unrestricted  ROM          Functional ROM: Unrestricted ROM          Muscle Tone/Strength: Functionally intact. No obvious neuro-muscular anomalies detected.  Muscle Tone/Strength: Functionally intact. No obvious neuro-muscular anomalies detected.  Sensory (Neurological): Unimpaired          Sensory (Neurological): Unimpaired          Palpation: No palpable anomalies              Palpation: No  palpable anomalies              Specialized Test(s): Deferred         Specialized Test(s): Deferred          Thoracic Spine Area Exam  Skin & Axial Inspection: No masses, redness, or swelling Alignment: Symmetrical Functional ROM: Unrestricted ROM Stability: No instability detected Muscle Tone/Strength: Functionally intact. No obvious neuro-muscular anomalies detected. Sensory (Neurological): Unimpaired Muscle strength & Tone: No palpable anomalies  Lumbar Spine Area Exam  Skin & Axial Inspection: No masses, redness, or swelling Alignment: Symmetrical Functional ROM: Decreased ROM      Stability: No instability detected Muscle Tone/Strength: Functionally intact. No obvious neuro-muscular anomalies detected. Sensory (Neurological): Musculoskeletal pain pattern Palpation: No palpable anomalies       Provocative Tests: Lumbar Hyperextension and rotation test: Positive bilaterally for facet joint pain. Lumbar Lateral bending test: evaluation deferred today       Patrick's Maneuver: evaluation deferred today                    Gait & Posture Assessment  Ambulation: Unassisted Gait: Relatively normal for age and body habitus Posture: WNL   Lower Extremity Exam    Side: Right lower extremity  Side: Left lower extremity  Skin & Extremity Inspection: Skin color, temperature, and hair growth are WNL. No peripheral edema or cyanosis. No masses, redness, swelling, asymmetry, or associated skin lesions. No contractures.  Skin & Extremity Inspection: Skin color, temperature, and hair growth are WNL. No peripheral edema or cyanosis. No masses, redness, swelling, asymmetry, or associated skin lesions. No contractures.  Functional ROM: Unrestricted ROM          Functional ROM: Unrestricted ROM          Muscle Tone/Strength: Functionally intact. No obvious neuro-muscular anomalies detected.  Muscle Tone/Strength: Functionally intact. No obvious neuro-muscular anomalies detected.  Sensory  (Neurological): Unimpaired  Sensory (Neurological): Unimpaired  Palpation: No palpable anomalies  Palpation: No palpable anomalies   Assessment  Primary Diagnosis & Pertinent Problem List: The primary encounter diagnosis was Chronic neck pain. Diagnoses of Cervicalgia, Chronic pain associated with significant psychosocial dysfunction, and Anxiety were also pertinent to this visit.  Visit Diagnosis (New problems to examiner): 1. Chronic neck pain   2. Cervicalgia   3. Chronic pain associated with significant psychosocial dysfunction   4. Anxiety   General Recommendations: The pain condition that the patient suffers from is best treated with a multidisciplinary approach that involves an increase in physical activity to prevent de-conditioning and worsening of the pain cycle, as well as psychological counseling (formal and/or informal) to address the co-morbid psychological affects of pain. Treatment will often involve judicious use of pain medications and interventional procedures to decrease the pain, allowing the patient to participate in the physical activity that will ultimately produce long-lasting pain reductions. The goal of the multidisciplinary approach is to return the patient to a higher level of overall function and to restore their ability  to perform activities of daily living.  57 year old female who presents with a chief complaint of axial neck pain that radiates into bilateral shoulders and periscapular region and occasionally radiates down to into bilateral arms above her elbows.  Patient has been on chronic opioid and benzodiazepine therapy for greater than 4 years.  She has been referred here by her primary care physician who has recently moved.  Patient's cervical spine x-rays as well as shoulder x-ray from 2017 was unremarkable and did not show any acute injury.  She has not had any neck surgery nor has she had.  It is unclear why she is on opioid therapy.  When asked about her Xanax  use, she states that she uses it occasionally as frequent fills on her [place] PMP of Xanax, quantity 60.  I explained to the patient our clinic policy and expressed my concerns about her chronic benzodiazepine and opioid use.  I recommended the patient see a psychiatrist for non-benzodiazepine alternatives in the management of her anxiety.  I also recommended the patient see a pain psychologist for pain coping strategies, CBT.  However the patient was not interested in either of these options.  I notified the patient that she would not be a candidate for opioid therapy at our clinic given her concomitant benzodiazepine use.  And further workup of her neck pain, discussed cervical MRI to evaluate for cervical degenerative disc disease, cervical facet syndrome, cervical spondylosis to explain her neck pain.  Patient also has symptoms of occipital neuralgia as she describes headache originating at the posterior portion of her scalp and then extending forward.  I discussed interventional options which could include cervical facet medial branch nerve blocks as well as cervical radio frequency ablation.  Patient also has a component of myofascial pain could benefit from trigger point injections.  I notified the patient that I can support her through non-opioid analgesics, referral to physical therapy, referral to psychiatry, referral to pain psychologist, interventional therapies.  Plan:  -Non-opioid management only given concomitant use of benzodiazepines.  Patient also has unmanaged depression and anxiety.  Recommend finding alternative non-benzodiazepine therapies for anxiety.  Recommend pain psychologist to help with CBT.  Patient not interested in these recommendations.   -cervical MRI to evaluate for cervical spondylosis, facet syndrome -Continue medication management with PCP -Recommended referral to physical therapy for cervicalgia, patient decline- -no UDS performed as I do not anticipate prescribing this  patient opioids.   Future interventional options include:  cervical facet medial branch nerve blocks as well as cervical radio frequency ablation.  Patient also has a component of myofascial pain could benefit from trigger point injections in cervical/trapezius region.  Ordered Lab-work, Procedure(s), Referral(s), & Consult(s): Orders Placed This Encounter  Procedures  . MR CERVICAL SPINE WO CONTRAST    Provider-requested follow-up: Return in about 4 weeks (around 03/24/2018) for After Imaging.  Future Appointments  Date Time Provider Christmas  03/24/2018  8:45 AM Gillis Santa, MD [facility] None    Primary Care Physician: Roselee Nova, MD (Inactive) Location: [facility] Note by: Gillis Santa, M.D, Date: 02/24/2018; Time: 12:51 PM

## 2018-02-24 NOTE — Telephone Encounter (Signed)
Called pt, no answer. LM for pt informing her of Dr.Lada's recommendations. Advised pt to call back for questions or concerns. CRM created.

## 2018-02-24 NOTE — Telephone Encounter (Signed)
Patient was referred to the pain clinic and saw Dr. Holley Raring today We recommend that she work with him and follow his recommendations I do not think referral to another pain specialist is appropriate or necessary

## 2018-02-24 NOTE — Telephone Encounter (Signed)
Copied from [place]. Topic: Referral - Request >> Feb 24, 2018 10:00 AM Synthia Innocent wrote: Reason for CRM: Requesting referral to [facility] in [facility] for pain management. Fax# [phone]   Please advise, ok to place referral?

## 2018-02-25 ENCOUNTER — Telehealth: Payer: Self-pay

## 2018-02-25 NOTE — Telephone Encounter (Signed)
Copied from [place]. Topic: Medical Record Request - Patient ROI Request >> Feb 25, 2018 10:29 AM Valla Leaver wrote: Patient Name/DOB/MRN #: Arnold,Annette/Oct 14, [id number] Requestor Name/Agency: [facility] in [place] at [place]  Call Back #: Office Number [phone] Information Requested: [facility] requesting all of her records from [place]. Please call patient at earliest convenience b/c patient is supposed to be seen at 8am tomorrow morning and needs confirmation on whether or not the records will be sent by end of business today.    Route to [employer] for [employer]. For all other clinics, route to the clinic's PEC Pool.

## 2018-02-25 NOTE — Telephone Encounter (Signed)
Spoke with Brittney with [facility] and was informed that they just needed the last year of office notes for patient.  I was able to fax the requested information over and I called and informed patient that request has been completed.

## 2018-02-26 DIAGNOSIS — M5481 Occipital neuralgia: Secondary | ICD-10-CM | POA: Diagnosis not present

## 2018-02-26 DIAGNOSIS — Z79891 Long term (current) use of opiate analgesic: Secondary | ICD-10-CM | POA: Diagnosis not present

## 2018-02-26 DIAGNOSIS — G894 Chronic pain syndrome: Secondary | ICD-10-CM | POA: Diagnosis not present

## 2018-02-26 DIAGNOSIS — M47812 Spondylosis without myelopathy or radiculopathy, cervical region: Secondary | ICD-10-CM | POA: Diagnosis not present

## 2018-02-26 DIAGNOSIS — M5412 Radiculopathy, cervical region: Secondary | ICD-10-CM | POA: Diagnosis not present

## 2018-02-26 DIAGNOSIS — Z79899 Other long term (current) drug therapy: Secondary | ICD-10-CM | POA: Diagnosis not present

## 2018-03-05 DIAGNOSIS — R202 Paresthesia of skin: Secondary | ICD-10-CM | POA: Diagnosis not present

## 2018-03-06 ENCOUNTER — Ambulatory Visit: Payer: Medicare Other

## 2018-03-16 DIAGNOSIS — M5412 Radiculopathy, cervical region: Secondary | ICD-10-CM | POA: Diagnosis not present

## 2018-03-23 DIAGNOSIS — Z79899 Other long term (current) drug therapy: Secondary | ICD-10-CM | POA: Diagnosis not present

## 2018-03-23 DIAGNOSIS — M47812 Spondylosis without myelopathy or radiculopathy, cervical region: Secondary | ICD-10-CM | POA: Diagnosis not present

## 2018-03-23 DIAGNOSIS — M5412 Radiculopathy, cervical region: Secondary | ICD-10-CM | POA: Diagnosis not present

## 2018-03-23 DIAGNOSIS — M5481 Occipital neuralgia: Secondary | ICD-10-CM | POA: Diagnosis not present

## 2018-03-24 ENCOUNTER — Ambulatory Visit: Payer: Medicare Other | Admitting: Student in an Organized Health Care Education/Training Program

## 2018-04-20 ENCOUNTER — Telehealth: Payer: Self-pay

## 2018-04-20 DIAGNOSIS — E785 Hyperlipidemia, unspecified: Secondary | ICD-10-CM

## 2018-04-21 NOTE — Telephone Encounter (Signed)
Patient never had her lipids and other lab done in September Last cholesterol panel was over a year ago, Feb 2018 She needs an appt and labs if she is staying at this practice

## 2018-04-21 NOTE — Telephone Encounter (Signed)
Spoke with pt and she will be seeing a new provider next month

## 2018-05-08 DIAGNOSIS — M1711 Unilateral primary osteoarthritis, right knee: Secondary | ICD-10-CM | POA: Diagnosis not present

## 2018-05-21 ENCOUNTER — Ambulatory Visit: Payer: Self-pay | Admitting: Family Medicine

## 2018-05-22 DIAGNOSIS — M5481 Occipital neuralgia: Secondary | ICD-10-CM | POA: Diagnosis not present

## 2018-05-22 DIAGNOSIS — M5412 Radiculopathy, cervical region: Secondary | ICD-10-CM | POA: Diagnosis not present

## 2018-05-22 DIAGNOSIS — Z79899 Other long term (current) drug therapy: Secondary | ICD-10-CM | POA: Diagnosis not present

## 2018-05-22 DIAGNOSIS — M47816 Spondylosis without myelopathy or radiculopathy, lumbar region: Secondary | ICD-10-CM | POA: Diagnosis not present

## 2018-05-22 DIAGNOSIS — M533 Sacrococcygeal disorders, not elsewhere classified: Secondary | ICD-10-CM | POA: Diagnosis not present

## 2018-05-22 DIAGNOSIS — M47812 Spondylosis without myelopathy or radiculopathy, cervical region: Secondary | ICD-10-CM | POA: Diagnosis not present

## 2018-07-02 IMAGING — CT CT ABD-PELV W/ CM
1 of 3 series · 14 of 32 positions shown, 19 images · IV contrast (APPLIED)
Comparison: Ultrasound January 30, 2017.

CLINICAL DATA: Intermittent pelvic pain and bloating for 4 months.

EXAM:
CT ABDOMEN AND PELVIS WITH CONTRAST
TECHNIQUE: Multidetector CT imaging of the abdomen and pelvis was performed
using the standard protocol following bolus administration of
intravenous contrast.
CONTRAST:  100mL ARPQA5-1VV IOPAMIDOL (ARPQA5-1VV) INJECTION 61%

[Series 2: axial st · axial · 0.70mm/px · z∈[-932,-556]mm · 14 of 85 slices shown, 19 images]
[im 5/85  soft-tissue]
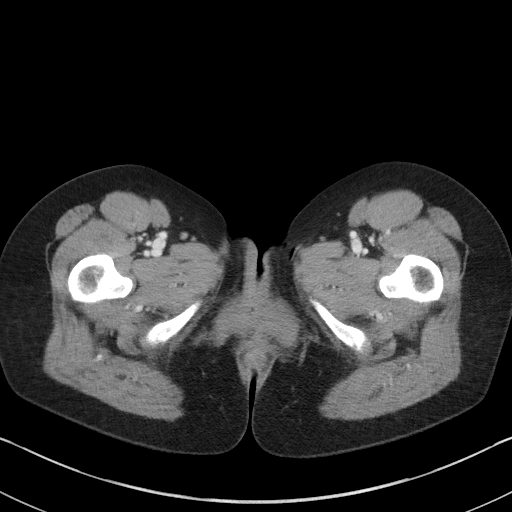
[im 5/85  bone]
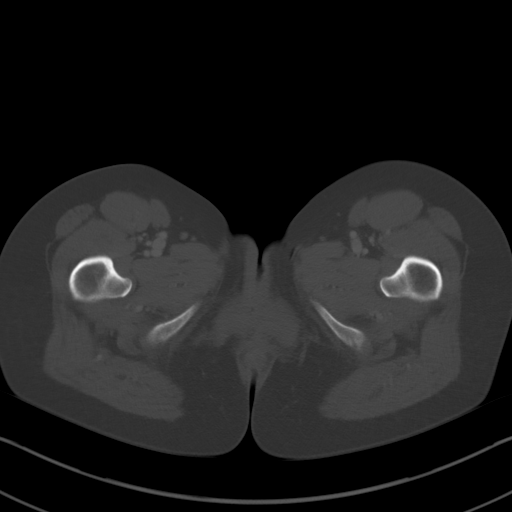
[im 10/85  soft-tissue]
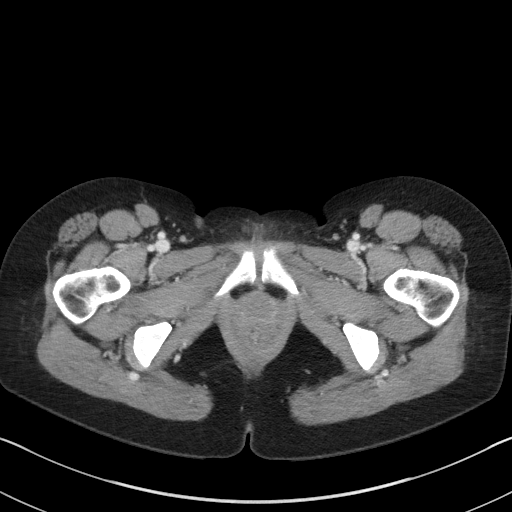
[im 19/85  soft-tissue]
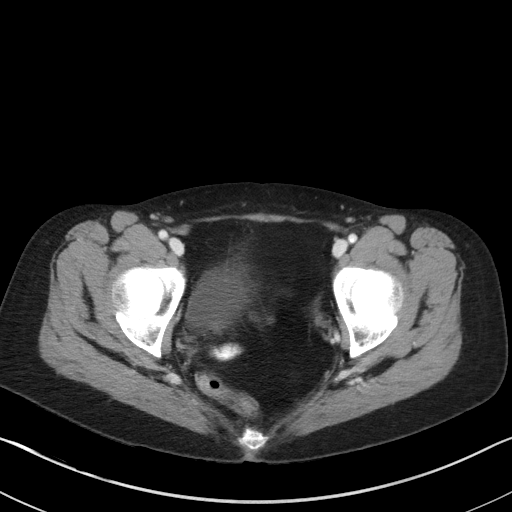
[im 24/85  soft-tissue]
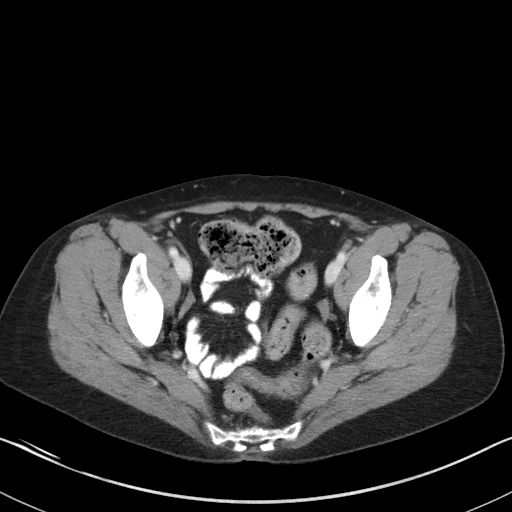
[im 29/85  soft-tissue]
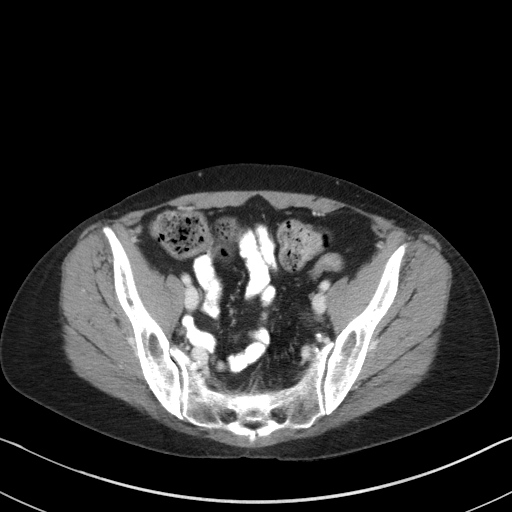
[im 38/85  soft-tissue]
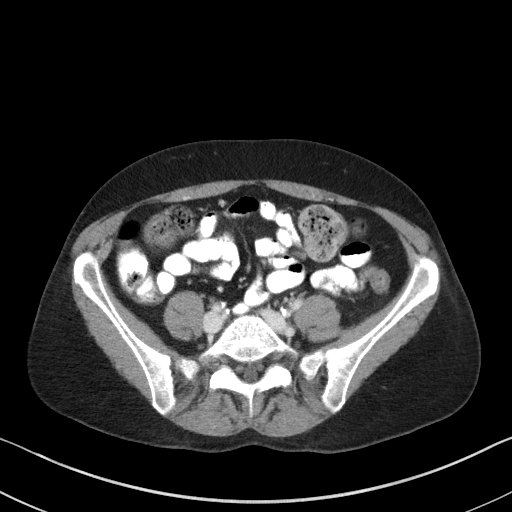
[im 43/85  soft-tissue]
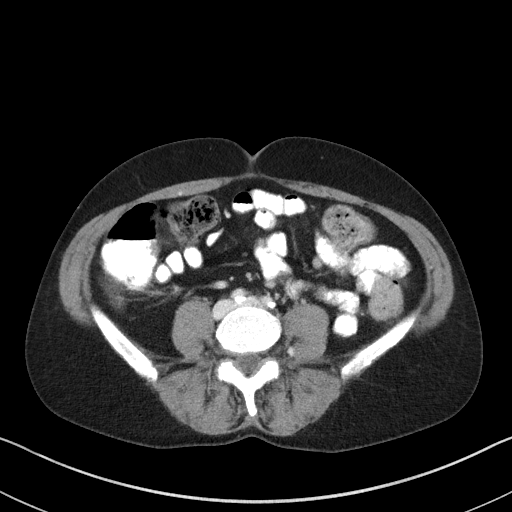
[im 47/85  soft-tissue]
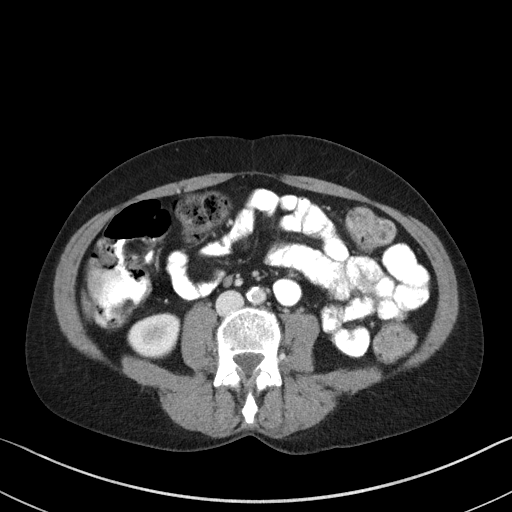
[im 57/85  soft-tissue]
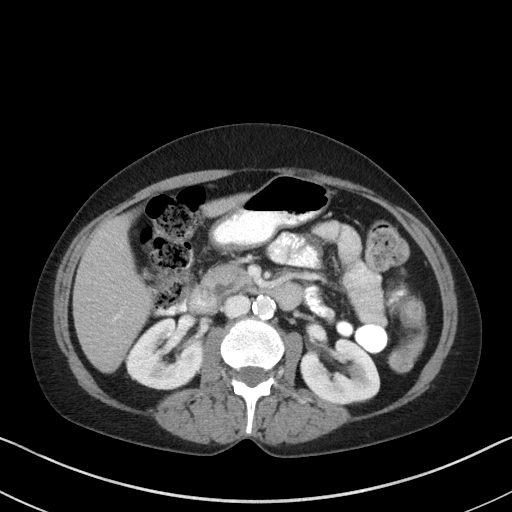
[im 57/85  bone]
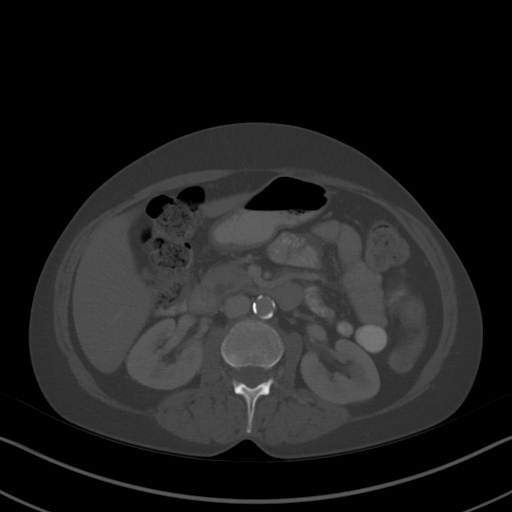
[im 61/85  soft-tissue]
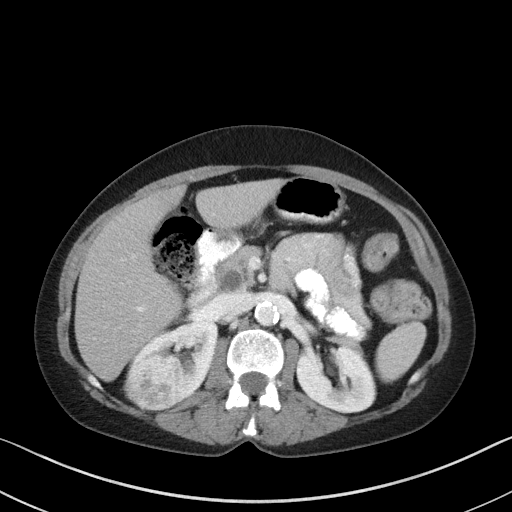
[im 66/85  soft-tissue]
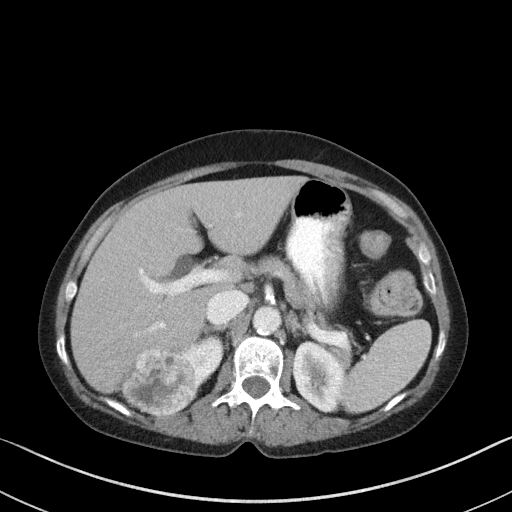
[im 66/85  lung]
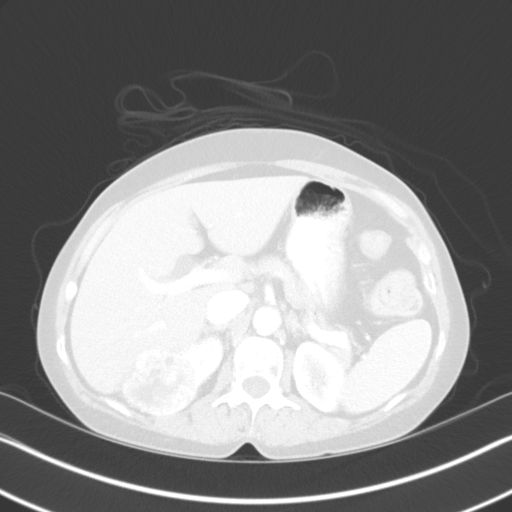
[im 71/85  lung]
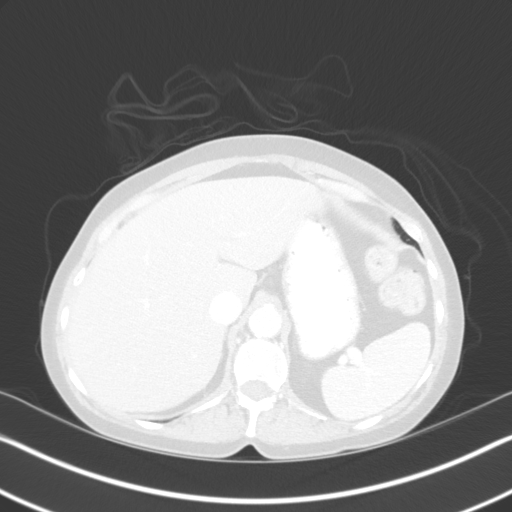
[im 75/85  soft-tissue]
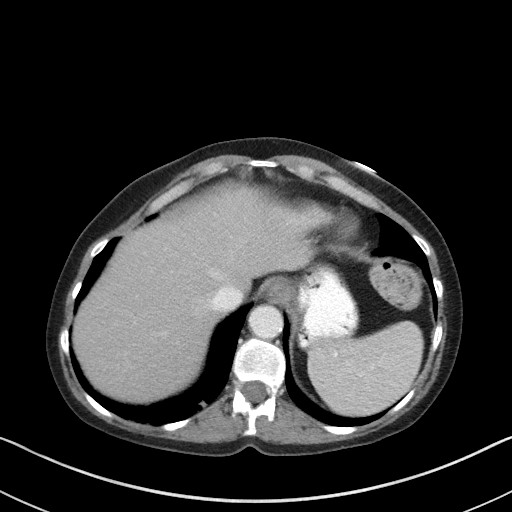
[im 75/85  lung]
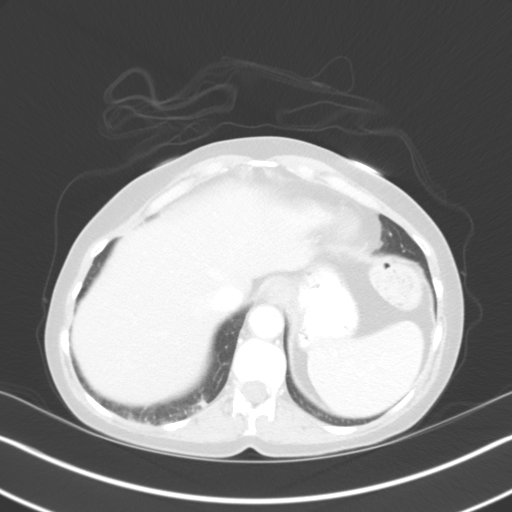
[im 80/85  soft-tissue]
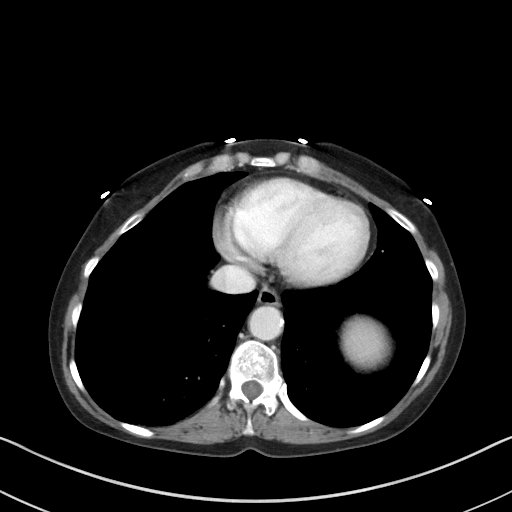
[im 80/85  lung]
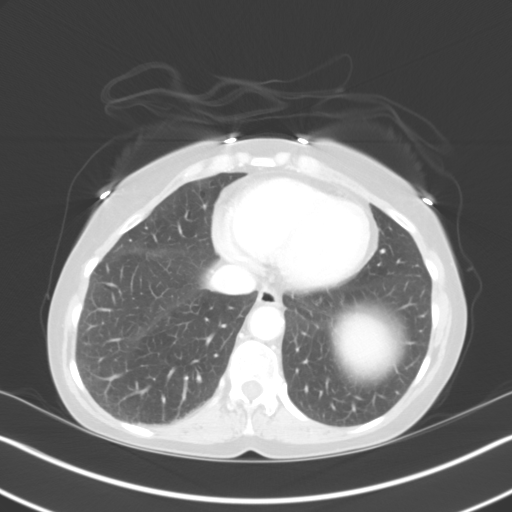

[14 of 32 positions shown; findings below may reference images not displayed]

FINDINGS: Lower chest: No acute abnormality.

Hepatobiliary: Status post cholecystectomy. No focal abnormality is
seen in the liver. Common bile duct measures 15 mm without
obstructing calculus.

Pancreas: Unremarkable. No pancreatic ductal dilatation or
surrounding inflammatory changes.

Spleen: Normal in size without focal abnormality.

Adrenals/Urinary Tract: Adrenal glands appear normal. Left kidney
and ureter appear normal. 6.0 x 5.5 x 4.6 cm enhancing mass is seen
arising from upper pole of right kidney consistent with renal cell
carcinoma. No hydronephrosis or renal obstruction is noted. Urinary
bladder is unremarkable.

Stomach/Bowel: There is no evidence of bowel obstruction.

Vascular/Lymphatic: Aortic atherosclerosis. No enlarged abdominal or
pelvic lymph nodes.

Reproductive: Status post hysterectomy.  Ovaries are unremarkable.

Other: No abdominal wall hernia or abnormality. No abdominopelvic
ascites.

Musculoskeletal: No acute or significant osseous findings.
IMPRESSION: 6 cm enhancing mass is seen arising from upper pole of right kidney
consistent with renal cell carcinoma.

Status post cholecystectomy. Common bile duct is significantly
dilated at 15 mm. This may be due to post cholecystectomy status,
but clinical correlation is recommended to rule out distal common
bile duct obstruction.

Aortic atherosclerosis.

These results will be called to the ordering clinician or
representative by the Radiologist Assistant, and communication
documented in the PACS or zVision Dashboard.

## 2018-07-10 ENCOUNTER — Ambulatory Visit: Payer: Self-pay

## 2018-07-10 ENCOUNTER — Ambulatory Visit: Payer: Self-pay | Admitting: Family Medicine

## 2018-07-12 IMAGING — CR DG CHEST 1V
1 series · 1 of 1 positions shown · non-contrast
Comparison: Two-view chest radiograph 01/12/2013.

CLINICAL DATA: 55-year-old female with right renal mass suspicious
for renal cell carcinoma.

EXAM:
CHEST 1 VIEW

[dg chest 1 view]
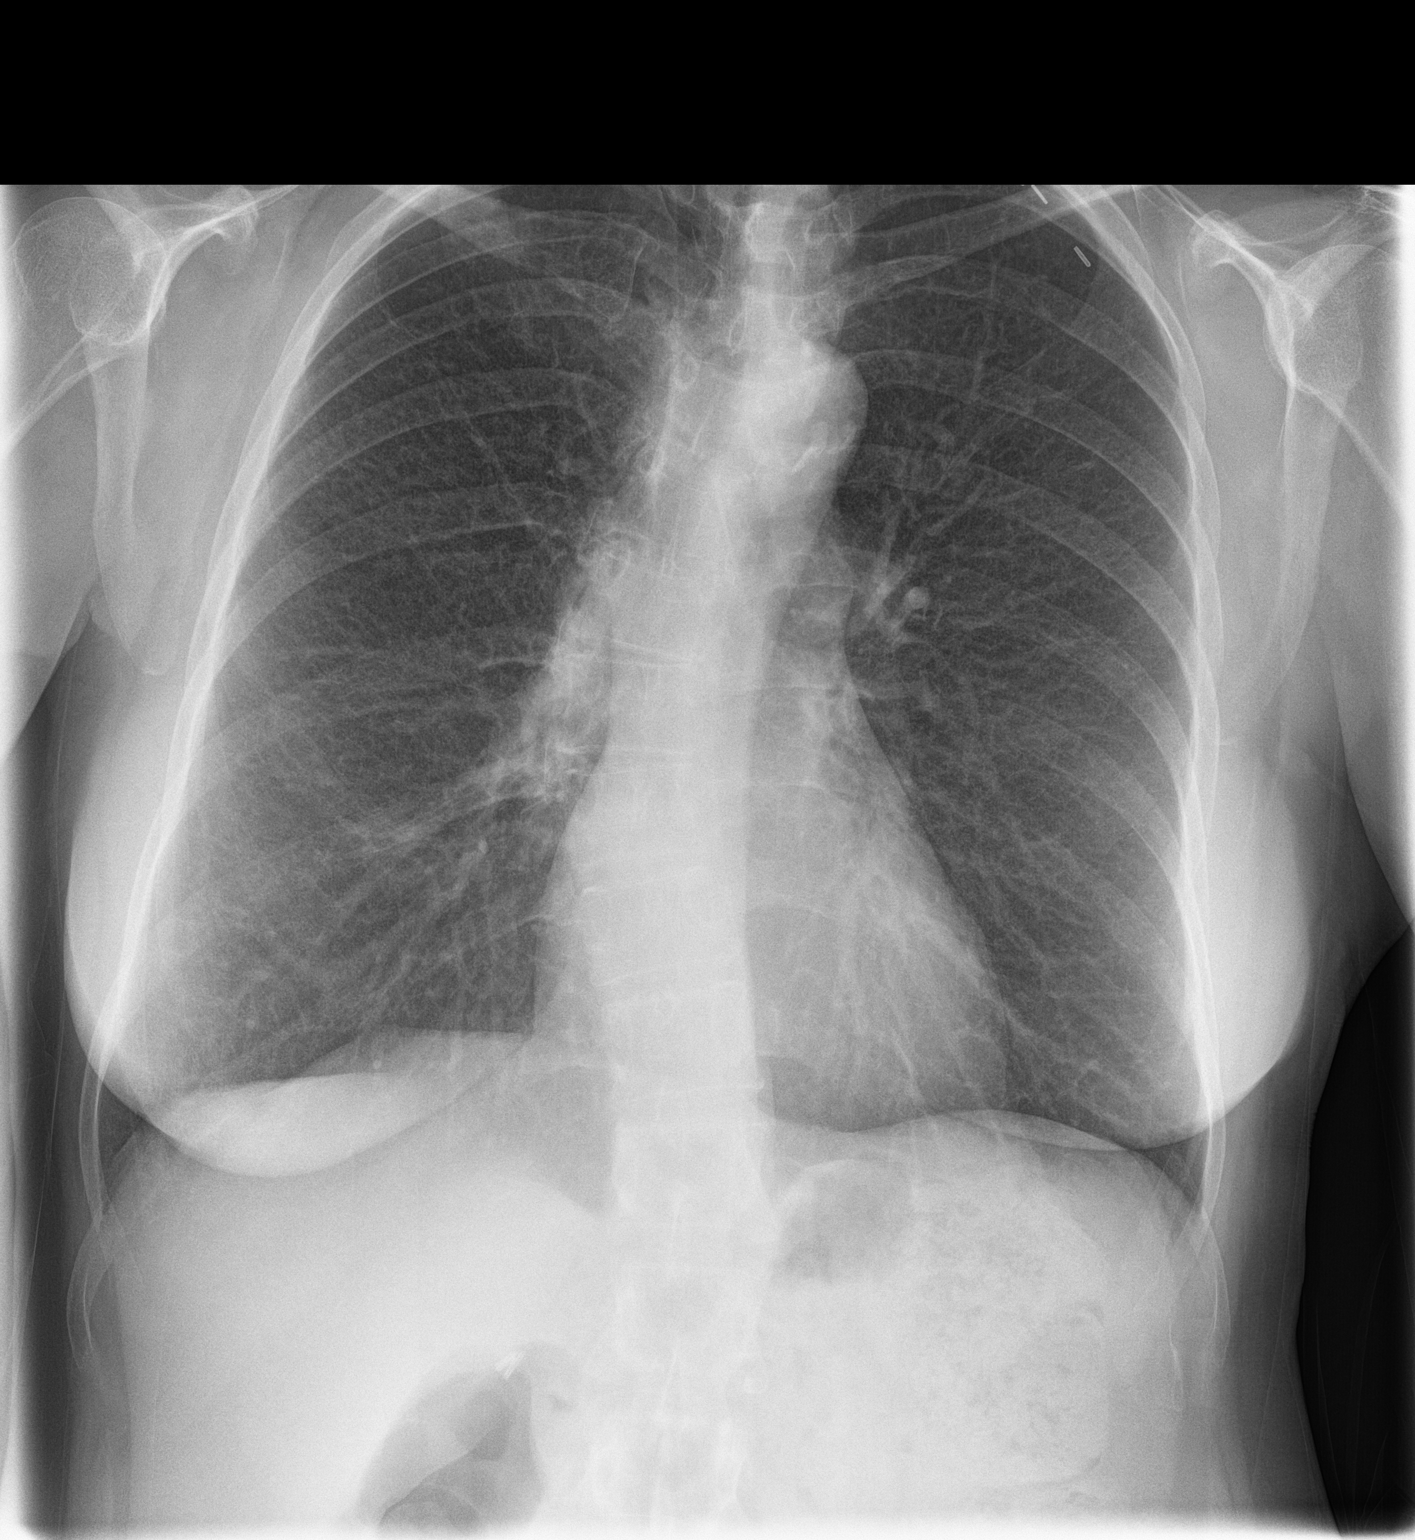

[1 of 1 positions shown; findings below may reference images not displayed]

FINDINGS: Chronic thoracolumbar scoliosis. No acute osseous abnormality
identified. Stable surgical clips projecting over the left lung
apex. Stable lung volumes. No pneumothorax, pulmonary edema, pleural
effusion or confluent pulmonary opacity. Stable mild diffuse
increased interstitial markings. Mediastinal contours are stable and
normal. Calcified aortic atherosclerosis. Stable cholecystectomy
clips. Negative visible bowel gas pattern.
IMPRESSION: 1.  No acute or metastatic cardiopulmonary abnormality.
2.  Calcified aortic atherosclerosis.

## 2018-07-16 ENCOUNTER — Ambulatory Visit (INDEPENDENT_AMBULATORY_CARE_PROVIDER_SITE_OTHER): Payer: Medicare Other

## 2018-07-16 ENCOUNTER — Encounter: Payer: Self-pay | Admitting: Family Medicine

## 2018-07-16 ENCOUNTER — Ambulatory Visit (INDEPENDENT_AMBULATORY_CARE_PROVIDER_SITE_OTHER): Payer: Medicare Other | Admitting: Family Medicine

## 2018-07-16 VITALS — BP 100/62 | HR 90 | Temp 98.3°F | Resp 12 | Ht 61.0 in | Wt 131.5 lb

## 2018-07-16 VITALS — BP 100/62 | HR 91 | Temp 98.3°F | Resp 12 | Ht 61.0 in | Wt 131.5 lb

## 2018-07-16 DIAGNOSIS — E538 Deficiency of other specified B group vitamins: Secondary | ICD-10-CM | POA: Diagnosis not present

## 2018-07-16 DIAGNOSIS — Z720 Tobacco use: Secondary | ICD-10-CM

## 2018-07-16 DIAGNOSIS — Z1211 Encounter for screening for malignant neoplasm of colon: Secondary | ICD-10-CM

## 2018-07-16 DIAGNOSIS — Z5181 Encounter for therapeutic drug level monitoring: Secondary | ICD-10-CM

## 2018-07-16 DIAGNOSIS — I700 Atherosclerosis of aorta: Secondary | ICD-10-CM | POA: Diagnosis not present

## 2018-07-16 DIAGNOSIS — H539 Unspecified visual disturbance: Secondary | ICD-10-CM | POA: Diagnosis not present

## 2018-07-16 DIAGNOSIS — R5383 Other fatigue: Secondary | ICD-10-CM

## 2018-07-16 DIAGNOSIS — E785 Hyperlipidemia, unspecified: Secondary | ICD-10-CM | POA: Diagnosis not present

## 2018-07-16 DIAGNOSIS — Z1231 Encounter for screening mammogram for malignant neoplasm of breast: Secondary | ICD-10-CM | POA: Diagnosis not present

## 2018-07-16 DIAGNOSIS — E559 Vitamin D deficiency, unspecified: Secondary | ICD-10-CM

## 2018-07-16 DIAGNOSIS — Z0000 Encounter for general adult medical examination without abnormal findings: Secondary | ICD-10-CM | POA: Diagnosis not present

## 2018-07-16 DIAGNOSIS — Z1239 Encounter for other screening for malignant neoplasm of breast: Secondary | ICD-10-CM

## 2018-07-16 DIAGNOSIS — I70299 Other atherosclerosis of native arteries of extremities, unspecified extremity: Secondary | ICD-10-CM

## 2018-07-16 DIAGNOSIS — Z135 Encounter for screening for eye and ear disorders: Secondary | ICD-10-CM

## 2018-07-16 DIAGNOSIS — Z114 Encounter for screening for human immunodeficiency virus [HIV]: Secondary | ICD-10-CM

## 2018-07-16 DIAGNOSIS — Z1159 Encounter for screening for other viral diseases: Secondary | ICD-10-CM | POA: Diagnosis not present

## 2018-07-16 DIAGNOSIS — I708 Atherosclerosis of other arteries: Secondary | ICD-10-CM

## 2018-07-16 DIAGNOSIS — E2839 Other primary ovarian failure: Secondary | ICD-10-CM

## 2018-07-16 DIAGNOSIS — R748 Abnormal levels of other serum enzymes: Secondary | ICD-10-CM | POA: Diagnosis not present

## 2018-07-16 DIAGNOSIS — R784 Finding of other drugs of addictive potential in blood: Secondary | ICD-10-CM | POA: Diagnosis not present

## 2018-07-16 HISTORY — DX: Atherosclerosis of other arteries: I70.8

## 2018-07-16 HISTORY — DX: Atherosclerosis of aorta: I70.0

## 2018-07-16 NOTE — Patient Instructions (Addendum)
I do encourage you to quit smoking Call [phone] to sign up for smoking cessation classes You can call 1-800-QUIT-NOW to talk with a smoking cessation coach Call me back if I can help with patches or nicotrol or any other smoking cessation product   Steps to Quit Smoking Smoking tobacco can be bad for your health. It can also affect almost every organ in your body. Smoking puts you and people around you at risk for many serious long-lasting (chronic) diseases. Quitting smoking is hard, but it is one of the best things that you can do for your health. It is never too late to quit. What are the benefits of quitting smoking? When you quit smoking, you lower your risk for getting serious diseases and conditions. They can include:  Lung cancer or lung disease.  Heart disease.  Stroke.  Heart attack.  Not being able to have children (infertility).  Weak bones (osteoporosis) and broken bones (fractures).  If you have coughing, wheezing, and shortness of breath, those symptoms may get better when you quit. You may also get sick less often. If you are pregnant, quitting smoking can help to lower your chances of having a baby of low birth weight. What can I do to help me quit smoking? Talk with your doctor about what can help you quit smoking. Some things you can do (strategies) include:  Quitting smoking totally, instead of slowly cutting back how much you smoke over a period of time.  Going to in-person counseling. You are more likely to quit if you go to many counseling sessions.  Using resources and support systems, such as: ? Database administrator with a Social worker. ? Phone quitlines. ? Careers information officer. ? Support groups or group counseling. ? Text messaging programs. ? Mobile phone apps or applications.  Taking medicines. Some of these medicines may have nicotine in them. If you are pregnant or breastfeeding, do not take any medicines to quit smoking unless your doctor says it  is okay. Talk with your doctor about counseling or other things that can help you.  Talk with your doctor about using more than one strategy at the same time, such as taking medicines while you are also going to in-person counseling. This can help make quitting easier. What things can I do to make it easier to quit? Quitting smoking might feel very hard at first, but there is a lot that you can do to make it easier. Take these steps:  Talk to your family and friends. Ask them to support and encourage you.  Call phone quitlines, reach out to support groups, or work with a Social worker.  Ask people who smoke to not smoke around you.  Avoid places that make you want (trigger) to smoke, such as: ? Bars. ? Parties. ? Smoke-break areas at work.  Spend time with people who do not smoke.  Lower the stress in your life. Stress can make you want to smoke. Try these things to help your stress: ? Getting regular exercise. ? Deep-breathing exercises. ? Yoga. ? Meditating. ? Doing a body scan. To do this, close your eyes, focus on one area of your body at a time from head to toe, and notice which parts of your body are tense. Try to relax the muscles in those areas.  Download or buy apps on your mobile phone or tablet that can help you stick to your quit plan. There are many free apps, such as QuitGuide from the [employer] Office manager for Disease Control  and Prevention). You can find more support from smokefree.gov and other websites.  This information is not intended to replace advice given to you by your health care provider. Make sure you discuss any questions you have with your health care provider. Document Released: 10/12/2009 Document Revised: 08/13/2016 Document Reviewed: 05/02/2015 Elsevier Interactive Patient Education  2018 [employer].  Let's get labs today If you have not heard anything from my staff in a week about any orders/referrals/studies from today, please contact us here to follow-up  [phone]  Please do call to schedule your bone density study; the number to schedule one at either [facility] or [facility] is [phone] or [phone]

## 2018-07-16 NOTE — Progress Notes (Signed)
BP 100/62   Pulse 90   Temp 98.3 F (36.8 C) (Oral)   Resp 12   Ht 5\' 1"  (1.549 m)   Wt 131 lb 8 oz (59.6 kg)   SpO2 93%   BMI 24.85 kg/m    Subjective:    Patient ID: Annette Arnold, female    DOB: April 29, 1961, 57 y.o.   MRN: [id number]  HPI: Annette Arnold is a 57 y.o. female  Chief Complaint  Patient presents with  . Follow-up    HPI Patient just completed her visit with Annette Borer, LPN  She is status post hysterectomy; 15 years or so; ovaries remained; hot flashes, moodiness, trouble sleeping like menopause Smoker Patient is the baby of the family; sister has thinning bones Calcification in the aorta back on 12/16/2008; aortoiliac athero 2016 xrays CXR on 03/21/2010 showed COPD Chest pain a while back; saw Dr. Ubaldo Glassing, having tests done and injected dye and heart was okay per patient Hx of B12 deficiency; not taking any B12 now; wold like checked; low energy; tired a lot Hx of renal cell, excised and one and done she says Smoker; she is worried about getting weird crazy dreams; first cig is within 15 minutes; last cig 15 minutes before bed; wakes up and smokes; never tried patches or nicotrol inhalers  Depression screen [facility] 2/9 07/16/2018 02/24/2018 01/22/2018 12/22/2017 11/25/2017  Decreased Interest 0 0 0 0 0  Down, Depressed, Hopeless 0 0 0 1 0  PHQ - 2 Score 0 0 0 1 0  Altered sleeping 0 - - - -  Tired, decreased energy 0 - - - -  Change in appetite 0 - - - -  Feeling bad or failure about yourself  0 - - - -  Trouble concentrating 0 - - - -  Moving slowly or fidgety/restless 0 - - - -  Suicidal thoughts 0 - - - -  PHQ-9 Score 0 - - - -  Difficult doing work/chores Not difficult at all - - - -    Relevant past medical, surgical, family and social history reviewed Past Medical History:  Diagnosis Date  . Anxiety   . Aorto-iliac atherosclerosis ([place]) 07/16/2018   Xray 2016  . Chronic pain   . Hyperlipidemia    Past Surgical History:  Procedure  Laterality Date  . ABDOMINAL HYSTERECTOMY    . APPENDECTOMY    . BREAST LUMPECTOMY    . CARPAL TUNNEL RELEASE    . CHOLECYSTECTOMY    . NEPHRECTOMY Right    09/2017 @ [facility]   Family History  Problem Relation Age of Onset  . Cancer Mother   . Diabetes Mother   . Thyroid disease Mother   . Heart disease Father   . Alcohol abuse Father   . Cancer Father   . Thyroid disease Sister   . Heart disease Sister   . Drug abuse Sister   . Paranoid behavior Sister   . Diabetes Sister   . Hyperlipidemia Sister   . Prostate cancer Neg Hx   . Kidney cancer Neg Hx    Social History   Tobacco Use  . Smoking status: Current Every Day Smoker    Packs/day: 1.00    Years: 40.00    Pack years: 40.00    Types: Cigarettes  . Smokeless tobacco: Never Used  Substance Use Topics  . Alcohol use: No  . Drug use: No    Interim medical history since last visit reviewed. Allergies and medications  reviewed  Review of Systems Per HPI unless specifically indicated above     Objective:    BP 100/62   Pulse 90   Temp 98.3 F (36.8 C) (Oral)   Resp 12   Ht 5\' 1"  (1.549 m)   Wt 131 lb 8 oz (59.6 kg)   SpO2 93%   BMI 24.85 kg/m   Wt Readings from Last 3 Encounters:  07/16/18 131 lb 8 oz (59.6 kg)  07/16/18 131 lb 8 oz (59.6 kg)  02/24/18 123 lb (55.8 kg)    Physical Exam  Constitutional: She appears well-developed and well-nourished.  HENT:  Mouth/Throat: Mucous membranes are normal.  Eyes: EOM are normal. No scleral icterus.  Cardiovascular: Normal rate and regular rhythm.  Pulmonary/Chest: Effort normal and breath sounds normal.  Psychiatric: She has a normal mood and affect. Her behavior is normal.      Assessment & Plan:   Problem List Items Addressed This Visit      Cardiovascular and Mediastinum   Aorto-iliac atherosclerosis ([place]) - Primary    Explained findings on xrays from 2009 and 2016; smoking cessation urged; check lipids, goal LDL less than 70      Relevant  Orders   Lipid panel (Completed)     Other   Vitamin D deficiency    Check level and supplement I fneeded      Relevant Orders   VITAMIN D 25 Hydroxy (Vit-D Deficiency, Fractures) (Completed)   Dyslipidemia    Check lipids today; goal LDL is less than 70      Relevant Orders   Lipid panel (Completed)   B12 deficiency    Check level and supplement if needed      Relevant Orders   Vitamin B12 (Completed)   Tobacco abuse    encouraged patient to quit smoking; see AVS       Other Visit Diagnoses    Screening for HIV (human immunodeficiency virus)       Relevant Orders   HIV antibody (with reflex) (Completed)   Encounter for hepatitis C screening test for low risk patient       Relevant Orders   Hepatitis C Antibody (Completed)   Ovarian failure       Relevant Orders   DG Bone Density   Other fatigue       Relevant Orders   TSH (Completed)   Medication monitoring encounter       Relevant Orders   CBC with Differential/Platelet (Completed)   COMPLETE METABOLIC PANEL WITH GFR (Completed)       Follow up plan: Return in about 6 months (around 01/16/2019) for follow-up visit with Dr. Sanda Klein.  An after-visit summary was printed and given to the patient at [facility].  Please see the patient instructions which may contain other information and recommendations beyond what is mentioned above in the assessment and plan.  No orders of the defined types were placed in this encounter.   Orders Placed This Encounter  Procedures  . DG Bone Density  . HIV antibody (with reflex)  . Hepatitis C Antibody  . CBC with Differential/Platelet  . COMPLETE METABOLIC PANEL WITH GFR  . Lipid panel  . TSH  . VITAMIN D 25 Hydroxy (Vit-D Deficiency, Fractures)  . Vitamin B12  . Gamma GT  . Alkaline Phosphatase Isoenzymes  . TEST AUTHORIZATION

## 2018-07-16 NOTE — Assessment & Plan Note (Signed)
Check level and supplement if needed 

## 2018-07-16 NOTE — Assessment & Plan Note (Signed)
Check lipids today; goal LDL is less than 70

## 2018-07-16 NOTE — Assessment & Plan Note (Signed)
Check level and supplement I fneeded

## 2018-07-16 NOTE — Patient Instructions (Signed)
Annette Arnold , Thank you for taking time to come for your Medicare Wellness Visit. I appreciate your ongoing commitment to your health goals. Please review the following plan we discussed and let me know if I can assist you in the future.   Screening recommendations/referrals: Colorectal Screening: You will receive your package in the mail for completion of your testing Mammogram: You will receive a call from our office regarding your appointment Bone Density: Will defer to Dr. Sanda Klein  Vision and Dental Exams: Recommended annual ophthalmology exams for early detection of glaucoma and other disorders of the eye Recommended annual dental exams for proper oral hygiene  Vaccinations: Influenza vaccine: Overdue Pneumococcal vaccine: Not yet required Tdap vaccine: Up to date Shingles vaccine: Please call your insurance company to determine your out of pocket expense for the Shingrix vaccine. You may also receive this vaccine at your local pharmacy or Health Dept.    Advanced directives: Advance directive discussed with you today. I have provided a copy for you to complete at home and have notarized. Once this is complete please bring a copy in to our office so we can scan it into your chart.  Goals: Recommend to drink at least 6-8 8oz glasses of water per day.  Next appointment: Please schedule your Annual Wellness Visit with your Nurse Health Advisor in one year.  Preventive Care 40-64 Years, Female Preventive care refers to lifestyle choices and visits with your health care provider that can promote health and wellness. What does preventive care include?  A yearly physical exam. This is also called an annual well check.  Dental exams once or twice a year.  Routine eye exams. Ask your health care provider how often you should have your eyes checked.  Personal lifestyle choices, including:  Daily care of your teeth and gums.  Regular physical activity.  Eating a healthy  diet.  Avoiding tobacco and drug use.  Limiting alcohol use.  Practicing safe sex.  Taking low-dose aspirin daily starting at age 88.  Taking vitamin and mineral supplements as recommended by your health care provider. What happens during an annual well check? The services and screenings done by your health care provider during your annual well check will depend on your age, overall health, lifestyle risk factors, and family history of disease. Counseling  Your health care provider may ask you questions about your:  Alcohol use.  Tobacco use.  Drug use.  Emotional well-being.  Home and relationship well-being.  Sexual activity.  Eating habits.  Work and work Statistician.  Method of birth control.  Menstrual cycle.  Pregnancy history. Screening  You may have the following tests or measurements:  Height, weight, and BMI.  Blood pressure.  Lipid and cholesterol levels. These may be checked every 5 years, or more frequently if you are over 17 years old.  Skin check.  Lung cancer screening. You may have this screening every year starting at age 2 if you have a 30-pack-year history of smoking and currently smoke or have quit within the past 15 years.  Fecal occult blood test (FOBT) of the stool. You may have this test every year starting at age 72.  Flexible sigmoidoscopy or colonoscopy. You may have a sigmoidoscopy every 5 years or a colonoscopy every 10 years starting at age 26.  Hepatitis C blood test.  Hepatitis B blood test.  Sexually transmitted disease (STD) testing.  Diabetes screening. This is done by checking your blood sugar (glucose) after you have not eaten for  a while (fasting). You may have this done every 1-3 years.  Mammogram. This may be done every 1-2 years. Talk to your health care provider about when you should start having regular mammograms. This may depend on whether you have a family history of breast cancer.  BRCA-related cancer  screening. This may be done if you have a family history of breast, ovarian, tubal, or peritoneal cancers.  Pelvic exam and Pap test. This may be done every 3 years starting at age 18. Starting at age 15, this may be done every 5 years if you have a Pap test in combination with an HPV test.  Bone density scan. This is done to screen for osteoporosis. You may have this scan if you are at high risk for osteoporosis. Discuss your test results, treatment options, and if necessary, the need for more tests with your health care provider. Vaccines  Your health care provider may recommend certain vaccines, such as:  Influenza vaccine. This is recommended every year.  Tetanus, diphtheria, and acellular pertussis (Tdap, Td) vaccine. You may need a Td booster every 10 years.  Zoster vaccine. You may need this after age 15.  Pneumococcal 13-valent conjugate (PCV13) vaccine. You may need this if you have certain conditions and were not previously vaccinated.  Pneumococcal polysaccharide (PPSV23) vaccine. You may need one or two doses if you smoke cigarettes or if you have certain conditions. Talk to your health care provider about which screenings and vaccines you need and how often you need them. This information is not intended to replace advice given to you by your health care provider. Make sure you discuss any questions you have with your health care provider. Document Released: 01/12/2016 Document Revised: 09/04/2016 Document Reviewed: 10/17/2015 Elsevier Interactive Patient Education  2017 [employer] Prevention in the Home Falls can cause injuries. They can happen to people of all ages. There are many things you can do to make your home safe and to help prevent falls. What can I do on the outside of my home?  Regularly fix the edges of walkways and driveways and fix any cracks.  Remove anything that might make you trip as you walk through a door, such as a raised step or  threshold.  Trim any bushes or trees on the path to your home.  Use bright outdoor lighting.  Clear any walking paths of anything that might make someone trip, such as rocks or tools.  Regularly check to see if handrails are loose or broken. Make sure that both sides of any steps have handrails.  Any raised decks and porches should have guardrails on the edges.  Have any leaves, snow, or ice cleared regularly.  Use sand or salt on walking paths during winter.  Clean up any spills in your garage right away. This includes oil or grease spills. What can I do in the bathroom?  Use night lights.  Install grab bars by the toilet and in the tub and shower. Do not use towel bars as grab bars.  Use non-skid mats or decals in the tub or shower.  If you need to sit down in the shower, use a plastic, non-slip stool.  Keep the floor dry. Clean up any water that spills on the floor as soon as it happens.  Remove soap buildup in the tub or shower regularly.  Attach bath mats securely with double-sided non-slip rug tape.  Do not have throw rugs and other things on the floor  that can make you trip. What can I do in the bedroom?  Use night lights.  Make sure that you have a light by your bed that is easy to reach.  Do not use any sheets or blankets that are too big for your bed. They should not hang down onto the floor.  Have a firm chair that has side arms. You can use this for support while you get dressed.  Do not have throw rugs and other things on the floor that can make you trip. What can I do in the kitchen?  Clean up any spills right away.  Avoid walking on wet floors.  Keep items that you use a lot in easy-to-reach places.  If you need to reach something above you, use a strong step stool that has a grab bar.  Keep electrical cords out of the way.  Do not use floor polish or wax that makes floors slippery. If you must use wax, use non-skid floor wax.  Do not have  throw rugs and other things on the floor that can make you trip. What can I do with my stairs?  Do not leave any items on the stairs.  Make sure that there are handrails on both sides of the stairs and use them. Fix handrails that are broken or loose. Make sure that handrails are as long as the stairways.  Check any carpeting to make sure that it is firmly attached to the stairs. Fix any carpet that is loose or worn.  Avoid having throw rugs at the top or bottom of the stairs. If you do have throw rugs, attach them to the floor with carpet tape.  Make sure that you have a light switch at the top of the stairs and the bottom of the stairs. If you do not have them, ask someone to add them for you. What else can I do to help prevent falls?  Wear shoes that:  Do not have high heels.  Have rubber bottoms.  Are comfortable and fit you well.  Are closed at the toe. Do not wear sandals.  If you use a stepladder:  Make sure that it is fully opened. Do not climb a closed stepladder.  Make sure that both sides of the stepladder are locked into place.  Ask someone to hold it for you, if possible.  Clearly mark and make sure that you can see:  Any grab bars or handrails.  First and last steps.  Where the edge of each step is.  Use tools that help you move around (mobility aids) if they are needed. These include:  Canes.  Walkers.  Scooters.  Crutches.  Turn on the lights when you go into a dark area. Replace any light bulbs as soon as they burn out.  Set up your furniture so you have a clear path. Avoid moving your furniture around.  If any of your floors are uneven, fix them.  If there are any pets around you, be aware of where they are.  Review your medicines with your doctor. Some medicines can make you feel dizzy. This can increase your chance of falling. Ask your doctor what other things that you can do to help prevent falls. This information is not intended to  replace advice given to you by your health care provider. Make sure you discuss any questions you have with your health care provider. Document Released: 10/12/2009 Document Revised: 05/23/2016 Document Reviewed: 01/20/2015 Elsevier Interactive Patient Education  2017 [employer]  [place] with Quitting Smoking Quitting smoking is a physical and mental challenge. You will face cravings, withdrawal symptoms, and temptation. Before quitting, work with your health care provider to make a plan that can help you cope. Preparation can help you quit and keep you from giving in. How can I cope with cravings? Cravings usually last for 5-10 minutes. If you get through it, the craving will pass. Consider taking the following actions to help you cope with cravings:  Keep your mouth busy: ? Chew sugar-free gum. ? Suck on hard candies or a straw. ? Brush your teeth.  Keep your hands and body busy: ? Immediately change to a different activity when you feel a craving. ? Squeeze or play with a ball. ? Do an activity or a hobby, like making bead jewelry, practicing needlepoint, or working with wood. ? Mix up your normal routine. ? Take a short exercise break. Go for a quick walk or run up and down stairs. ? Spend time in public places where smoking is not allowed.  Focus on doing something kind or helpful for someone else.  Call a friend or family member to talk during a craving.  Join a support group.  Call a quit line, such as 1-800-QUIT-NOW.  Talk with your health care provider about medicines that might help you cope with cravings and make quitting easier for you.  How can I deal with withdrawal symptoms? Your body may experience negative effects as it tries to get used to not having nicotine in the system. These effects are called withdrawal symptoms. They may include:  Feeling hungrier than normal.  Trouble concentrating.  Irritability.  Trouble sleeping.  Feeling  depressed.  Restlessness and agitation.  Craving a cigarette.  To manage withdrawal symptoms:  Avoid places, people, and activities that trigger your cravings.  Remember why you want to quit.  Get plenty of sleep.  Avoid coffee and other caffeinated drinks. These may worsen some of your symptoms.  How can I handle social situations? Social situations can be difficult when you are quitting smoking, especially in the first few weeks. To manage this, you can:  Avoid parties, bars, and other social situations where people might be smoking.  Avoid alcohol.  Leave right away if you have the urge to smoke.  Explain to your family and friends that you are quitting smoking. Ask for understanding and support.  Plan activities with friends or family where smoking is not an option.  What are some ways I can cope with stress? Wanting to smoke may cause stress, and stress can make you want to smoke. Find ways to manage your stress. Relaxation techniques can help. For example:  Breathe slowly and deeply, in through your nose and out through your mouth.  Listen to soothing, relaxing music.  Talk with a family member or friend about your stress.  Light a candle.  Soak in a bath or take a shower.  Think about a peaceful place.  What are some ways I can prevent weight gain? Be aware that many people gain weight after they quit smoking. However, not everyone does. To keep from gaining weight, have a plan in place before you quit and stick to the plan after you quit. Your plan should include:  Having healthy snacks. When you have a craving, it may help to: ? Eat plain popcorn, crunchy carrots, celery, or other cut vegetables. ? Chew sugar-free gum.  Changing how you eat: ? Eat small portion sizes  at meals. ? Eat 4-6 small meals throughout the day instead of 1-2 large meals a day. ? Be mindful when you eat. Do not watch television or do other things that might distract you as you  eat.  Exercising regularly: ? Make time to exercise each day. If you do not have time for a long workout, do short bouts of exercise for 5-10 minutes several times a day. ? Do some form of strengthening exercise, like weight lifting, and some form of aerobic exercise, like running or swimming.  Drinking plenty of water or other low-calorie or no-calorie drinks. Drink 6-8 glasses of water daily, or as much as instructed by your health care provider.  Summary  Quitting smoking is a physical and mental challenge. You will face cravings, withdrawal symptoms, and temptation to smoke again. Preparation can help you as you go through these challenges.  You can cope with cravings by keeping your mouth busy (such as by chewing gum), keeping your body and hands busy, and making calls to family, friends, or a helpline for people who want to quit smoking.  You can cope with withdrawal symptoms by avoiding places where people smoke, avoiding drinks with caffeine, and getting plenty of rest.  Ask your health care provider about the different ways to prevent weight gain, avoid stress, and handle social situations. This information is not intended to replace advice given to you by your health care provider. Make sure you discuss any questions you have with your health care provider. Document Released: 12/13/2016 Document Revised: 12/13/2016 Document Reviewed: 12/13/2016 Elsevier Interactive Patient Education  [employer].

## 2018-07-16 NOTE — Progress Notes (Signed)
Subjective:   Annette Arnold is a 57 y.o. female who presents for Medicare Annual (Subsequent) preventive examination.  Review of Systems:  N/A Cardiac Risk Factors include: advanced age (>34men, >65 women);dyslipidemia;sedentary lifestyle;smoking/ tobacco exposure     Objective:     Vitals: BP 100/62 (BP Location: Left Arm, Patient Position: Sitting, Cuff Size: Normal)   Pulse 91   Temp 98.3 F (36.8 C) (Oral)   Resp 12   Ht 5\' 1"  (1.549 m)   Wt 131 lb 8 oz (59.6 kg)   SpO2 93%   BMI 24.85 kg/m   Body mass index is 24.85 kg/m.  Advanced Directives 07/16/2018 02/24/2018 10/24/2017 08/28/2017 07/30/2017 07/01/2017 06/04/2017  Does Patient Have a Medical Advance Directive? No No No No No No No  Would patient like information on creating a medical advance directive? Yes (MAU/Ambulatory/Procedural Areas - Information given) Yes (MAU/Ambulatory/Procedural Areas - Information given) - - - - -  Some encounter information is confidential and restricted. Go to Review Flowsheets activity to see all data.    Tobacco Social History   Tobacco Use  Smoking Status Current Every Day Smoker  . Packs/day: 1.00  . Years: 40.00  . Pack years: 40.00  . Types: Cigarettes  Smokeless Tobacco Never Used     Ready to quit: No Counseling given: Yes  Clinical Intake:  Pre-visit preparation completed: Yes  Pain : No/denies pain   BMI - recorded: 24.85 Nutritional Status: BMI of 19-24  Normal Nutritional Risks: None Diabetes: No  How often do you need to have someone help you when you read instructions, pamphlets, or other written materials from your doctor or pharmacy?: 1 - Never  Interpreter Needed?: No  Information entered by :: AEversole, LPN  Past Medical History:  Diagnosis Date  . Anxiety   . Chronic pain   . Hyperlipidemia    Past Surgical History:  Procedure Laterality Date  . ABDOMINAL HYSTERECTOMY    . APPENDECTOMY    . BREAST LUMPECTOMY    . CARPAL TUNNEL RELEASE      . CHOLECYSTECTOMY    . NEPHRECTOMY Right    09/2017 @ [facility]   Family History  Problem Relation Age of Onset  . Cancer Mother   . Diabetes Mother   . Thyroid disease Mother   . Heart disease Father   . Alcohol abuse Father   . Cancer Father   . Thyroid disease Sister   . Heart disease Sister   . Drug abuse Sister   . Paranoid behavior Sister   . Diabetes Sister   . Hyperlipidemia Sister   . Prostate cancer Neg Hx   . Kidney cancer Neg Hx    Social History   Socioeconomic History  . Marital status: Divorced    Spouse name: Not on file  . Number of children: 4  . Years of education: Not on file  . Highest education level: 8th grade  Occupational History  . Occupation: Disabled  Social Needs  . Financial resource strain: Not hard at all  . Food insecurity:    Worry: Never true    Inability: Never true  . Transportation needs:    Medical: No    Non-medical: No  Tobacco Use  . Smoking status: Current Every Day Smoker    Packs/day: 1.00    Years: 40.00    Pack years: 40.00    Types: Cigarettes  . Smokeless tobacco: Never Used  Substance and Sexual Activity  . Alcohol use: No  .  Drug use: No  . Sexual activity: Yes  Lifestyle  . Physical activity:    Days per week: 0 days    Minutes per session: 0 min  . Stress: Not at all  Relationships  . Social connections:    Talks on phone: Patient refused    Gets together: Patient refused    Attends religious service: Patient refused    Active member of club or organization: Patient refused    Attends meetings of clubs or organizations: Patient refused    Relationship status: Divorced  Other Topics Concern  . Not on file  Social History Narrative  . Not on file    Outpatient Encounter Medications as of 07/16/2018  Medication Sig  . acetaminophen (TYLENOL) 160 MG/5ML liquid Take 160 mg by mouth every 6 (six) hours as needed for fever.  . Aspirin-Salicylamide-Caffeine (BC HEADACHE PO) Take by mouth as needed.  .  calcipotriene (DOVONOX) 0.005 % cream apply to affected area once daily to twice a day if needed for ps...  (REFER TO PRESCRIPTION NOTES).  . cyclobenzaprine (FLEXERIL) 5 MG tablet Take 1 tablet (5 mg total) by mouth at bedtime.  . Emollient (AQUAPHOR EX) Apply topically.  . Oxycodone HCl 10 MG TABS Take 1 tablet (10 mg total) by mouth 3 (three) times daily as needed.  . polyethylene glycol (MIRALAX / GLYCOLAX) packet Take 17 g by mouth daily.  . pravastatin (PRAVACHOL) 40 MG tablet Take 1 tablet (40 mg total) by mouth daily.  Marland Kitchen ALPRAZolam (XANAX) 0.5 MG tablet Take 1 tablet (0.5 mg total) by mouth 2 (two) times daily as needed for anxiety.  . lidocaine (LIDODERM) 5 % Place 1 patch onto the skin daily. Remove & Discard patch within 12 hours or as directed by MD (Patient not taking: Reported on 07/16/2018)  . rOPINIRole (REQUIP) 0.25 MG tablet Take 2 tablets (0.5 mg total) by mouth at bedtime. (Patient not taking: Reported on 02/24/2018)  . venlafaxine XR (EFFEXOR-XR) 37.5 MG 24 hr capsule Take 1 capsule (37.5 mg total) by mouth daily with breakfast. (Patient not taking: Reported on 02/24/2018)   No facility-administered encounter medications on file as of 07/16/2018.     Activities of Daily Living In your present state of health, do you have any difficulty performing the following activities: 07/16/2018 01/22/2018  Hearing? N N  Comment denies hearing aids -  Vision? N N  Comment denies eyeglasses -  Difficulty concentrating or making decisions? N N  Walking or climbing stairs? Y Y  Comment lower leg pain and back pain sometimes lower back  Dressing or bathing? N N  Doing errands, shopping? N N  Preparing Food and eating ? N -  Comment full set upper and lower dentures -  Using the Toilet? N -  In the past six months, have you accidently leaked urine? N -  Do you have problems with loss of bowel control? N -  Managing your Medications? N -  Managing your Finances? N -  Housekeeping or  managing your Housekeeping? N -  Some recent data might be hidden    Patient Care Team: Lada, Satira Anis, MD as PCP - General (Family Medicine) Bowen, Jeannie Fend, PA-C as Consulting Physician    Assessment:   This is a routine wellness examination for Annette Arnold.  Exercise Activities and Dietary recommendations Current Exercise Habits: The patient does not participate in regular exercise at present, Exercise limited by: None identified  Goals    . DIET - INCREASE WATER  INTAKE     Recommend to drink at least 6-8 8oz glasses of water per day.       Fall Risk Fall Risk  07/16/2018 02/24/2018 01/22/2018 12/22/2017 11/25/2017  Falls in the past year? No Yes No No No  Comment - - - - -  Number falls in past yr: - 1 - - -  Injury with Fall? - No - - -  Risk for fall due to : History of fall(s);Medication side effect - - - -  Follow up - Falls prevention discussed - - -  Comment - - - - -   FALL RISK PREVENTION PERTAINING TO HOME: Is your home free of loose throw rugs in walkways, pet beds, electrical cords, etc? Yes Is there adequate lighting in your home to reduce risk of falls?  Yes Are there stairs in or around your home WITH handrails? No stairs  ASSISTIVE DEVICES UTILIZED TO PREVENT FALLS: Use of a cane, walker or w/c? No Grab bars in the bathroom? No  Shower chair or a place to sit while bathing? No An elevated toilet seat or a handicapped toilet? No  Timed Get Up and Go Performed: Yes. Pt ambulated 10 feet within 10 sec. Gait stead-fast and without the use of an assistive device. No intervention required at this time. Fall risk prevention has been discussed.  Community Resource Referral:  Pt declined my offer to send [employer] Referral to Care Guide for installation of grab bars in the shower, shower chair or an elevated toilet seat.  Depression Screen PHQ 2/9 Scores 07/16/2018 02/24/2018 01/22/2018 12/22/2017  PHQ - 2 Score 0 0 0 1  PHQ- 9 Score 0 - - -      Cognitive Function     6CIT Screen 07/16/2018 01/06/2017  What Year? 0 points 0 points  What month? 0 points 0 points  What time? 0 points 0 points  Count back from 20 0 points 0 points  Months in reverse 0 points 0 points  Repeat phrase 0 points 4 points  Total Score 0 4    Immunization History  Administered Date(s) Administered  . Tdap 03/22/2010    Qualifies for Shingles Vaccine?Yes. Due for Shingrix. Education has been provided regarding the importance of this vaccine. Pt has been advised to call insurance company to determine out of pocket expense. Advised may also receive vaccine at local pharmacy or Health Dept. Verbalized acceptance and understanding.  Overdue for Flu vaccine. Education has been provided regarding the importance of this vaccine and advised to receive when available. Verbalized acceptance and understanding.  Screening Tests Health Maintenance  Topic Date Due  . Hepatitis C Screening  12/25/1961  . HIV Screening  09/06/1978  . Fecal DNA (Cologuard)  09/06/2013  . MAMMOGRAM  11/25/2018 (Originally 03/11/2015)  . PAP SMEAR  12/30/2026 (Originally 09/06/1984)  . INFLUENZA VACCINE  07/30/2018  . TETANUS/TDAP  03/22/2020    Cancer Screenings: Lung: Low Dose CT Chest recommended if Age 54-80 years, 30 pack-year currently smoking OR have quit w/in 15years. Patient does qualify. An Epic message has been sent to Burgess Estelle, RN (Oncology Nurse Navigator) regarding the possible need for this exam. Raquel Sarna will review the patient's chart to determine if the patient truly qualifies for the exam. If the patient qualifies, Raquel Sarna will order the Low Dose CT of the chest to facilitate the scheduling of this exam. Breast:  Up to date on Mammogram? No. Completed 03/10/14. Repeat every year. Ordered today. Message sent to  referral coordinator for scheduling purposes.   Up to date of Bone Density/Dexa? No. Previous history of hysterectomy. States ovaries were not removed.  Underwent nephrectomy 2018 but did not undergo oncologic treatments. Will defer need for DEXA to Dr. Sanda Klein. Colorectal: Declined offer to refer to GI for colonoscopy. Ordered Cologuard today. Pt aware she will receive her package in the mail for completion.  Additional Screenings: Hepatitis C Screening: Pt to have other blood work completed today. Will defer this screening to Dr. Sanda Klein.    Plan:  I have personally reviewed and addressed the Medicare Annual Wellness questionnaire and have noted the following in the patient's chart:  A. Medical and social history B. Use of alcohol, tobacco or illicit drugs  C. Current medications and supplements D. Functional ability and status E.  Nutritional status F.  Physical activity G. Advance directives H. List of other physicians I.  Hospitalizations, surgeries, and ER visits in previous 12 months J.  [place] such as hearing and vision if needed, cognitive and depression L. Referrals and appointments  In addition, I have reviewed and discussed with patient certain preventive protocols, quality metrics, and best practice recommendations. A written personalized care plan for preventive services as well as general preventive health recommendations were provided to patient.  See attached scanned questionnaire for additional information.   Signed,  Aleatha Borer, LPN Nurse Health Advisor

## 2018-07-16 NOTE — Assessment & Plan Note (Signed)
Explained findings on xrays from 2009 and 2016; smoking cessation urged; check lipids, goal LDL less than 70

## 2018-07-18 ENCOUNTER — Other Ambulatory Visit: Payer: Self-pay | Admitting: Family Medicine

## 2018-07-18 DIAGNOSIS — R748 Abnormal levels of other serum enzymes: Secondary | ICD-10-CM | POA: Insufficient documentation

## 2018-07-18 MED ORDER — VITAMIN D (ERGOCALCIFEROL) 1.25 MG (50000 UNIT) PO CAPS: 50000.0000 [IU] | ORAL_CAPSULE | ORAL | 1 refills | Status: AC

## 2018-07-18 NOTE — Assessment & Plan Note (Signed)
Check labs; refer if needed

## 2018-07-18 NOTE — Progress Notes (Signed)
Add-on labs

## 2018-07-20 ENCOUNTER — Other Ambulatory Visit: Payer: Self-pay

## 2018-07-20 ENCOUNTER — Other Ambulatory Visit: Payer: Self-pay | Admitting: Family Medicine

## 2018-07-20 DIAGNOSIS — E785 Hyperlipidemia, unspecified: Secondary | ICD-10-CM

## 2018-07-20 DIAGNOSIS — R748 Abnormal levels of other serum enzymes: Secondary | ICD-10-CM

## 2018-07-20 NOTE — Progress Notes (Signed)
Hi, Yes I am aware of this patient's dilated common bile duct and agree with sending off the isoenzymes and the GGT.  It was recommended the last time I saw her that she get the kidney mass taking care of before we continue with any further workup of the dilated common bile duct. The patient's AST and ALT are normal therefore starting a statin on this patient should not be a problem. We will have her seen in the office again to follow-up on her dilated common bile duct and increased alkaline phosphatase. Estell Puccini

## 2018-07-20 NOTE — Progress Notes (Signed)
Last MR abdomen MRCP w/wo contrast reviewed Will hold on the statin until she f/u with gastroenterologist Sending note to GI to see if they will see her back, f/u for abnormal imaging, abnormal labs before I put her back on statin

## 2018-07-21 ENCOUNTER — Telehealth: Payer: Self-pay

## 2018-07-21 DIAGNOSIS — M5481 Occipital neuralgia: Secondary | ICD-10-CM | POA: Diagnosis not present

## 2018-07-21 DIAGNOSIS — M47812 Spondylosis without myelopathy or radiculopathy, cervical region: Secondary | ICD-10-CM | POA: Diagnosis not present

## 2018-07-21 DIAGNOSIS — R945 Abnormal results of liver function studies: Principal | ICD-10-CM

## 2018-07-21 DIAGNOSIS — M542 Cervicalgia: Secondary | ICD-10-CM | POA: Diagnosis not present

## 2018-07-21 DIAGNOSIS — Z79899 Other long term (current) drug therapy: Secondary | ICD-10-CM | POA: Diagnosis not present

## 2018-07-21 DIAGNOSIS — M545 Low back pain: Secondary | ICD-10-CM | POA: Diagnosis not present

## 2018-07-21 DIAGNOSIS — M533 Sacrococcygeal disorders, not elsewhere classified: Secondary | ICD-10-CM | POA: Diagnosis not present

## 2018-07-21 DIAGNOSIS — M5412 Radiculopathy, cervical region: Secondary | ICD-10-CM | POA: Diagnosis not present

## 2018-07-21 DIAGNOSIS — M47816 Spondylosis without myelopathy or radiculopathy, lumbar region: Secondary | ICD-10-CM | POA: Diagnosis not present

## 2018-07-21 DIAGNOSIS — R7989 Other specified abnormal findings of blood chemistry: Secondary | ICD-10-CM

## 2018-07-21 MED ORDER — PRAVASTATIN SODIUM 40 MG PO TABS: 40.0000 mg | ORAL_TABLET | Freq: Every day | ORAL | 1 refills | Status: DC

## 2018-07-21 NOTE — Telephone Encounter (Signed)
-----   Message from Arnetha Courser, MD sent at 07/20/2018  5:08 PM EDT ----- Roselyn Reef, please let patient know that I'd like her to follow-up with the GI doctor about her liver tests; I'd like to make sure her liver is okay before putting her back on the cholesterol medicine; thank you

## 2018-07-22 ENCOUNTER — Telehealth: Payer: Self-pay | Admitting: *Deleted

## 2018-07-22 ENCOUNTER — Encounter: Payer: Self-pay | Admitting: Family Medicine

## 2018-07-22 DIAGNOSIS — Z720 Tobacco use: Secondary | ICD-10-CM | POA: Insufficient documentation

## 2018-07-22 DIAGNOSIS — F17210 Nicotine dependence, cigarettes, uncomplicated: Secondary | ICD-10-CM | POA: Insufficient documentation

## 2018-07-22 NOTE — Telephone Encounter (Signed)
Received referral for lung screening scan. Patient notified that will call back to schedule after patient turns 57 years old.

## 2018-07-22 NOTE — Assessment & Plan Note (Signed)
encouraged patient to quit smoking; see AVS

## 2018-07-24 LAB — ALKALINE PHOSPHATASE ISOENZYMES
Alkaline phosphatase (APISO): 156 U/L — ABNORMAL HIGH (ref 33–130)
Bone Isoenzymes: 46 % (ref 28–66)
Intestinal Isoenzymes: 5 % (ref 1–24)
Liver Isoenzymes: 49 % (ref 25–69)

## 2018-07-24 LAB — COMPLETE METABOLIC PANEL WITH GFR
AG RATIO: 1.3 (calc) (ref 1.0–2.5)
ALT: 5 U/L — ABNORMAL LOW (ref 6–29)
AST: 14 U/L (ref 10–35)
Albumin: 4.5 g/dL (ref 3.6–5.1)
Alkaline phosphatase (APISO): 145 U/L — ABNORMAL HIGH (ref 33–130)
BUN: 8 mg/dL (ref 7–25)
CALCIUM: 9.5 mg/dL (ref 8.6–10.4)
CO2: 23 mmol/L (ref 20–32)
CREATININE: 0.95 mg/dL (ref 0.50–1.05)
Chloride: 102 mmol/L (ref 98–110)
GFR, EST NON AFRICAN AMERICAN: 68 mL/min/{1.73_m2} (ref >=60)
GFR, Est African American: 79 mL/min/{1.73_m2} (ref >=60)
Globulin: 3.6 g/dL (calc) (ref 1.9–3.7)
Glucose, Bld: 91 mg/dL (ref 65–139)
POTASSIUM: 3.8 mmol/L (ref 3.5–5.3)
Sodium: 138 mmol/L (ref 135–146)
Total Bilirubin: 0.3 mg/dL (ref 0.2–1.2)
Total Protein: 8.1 g/dL (ref 6.1–8.1)

## 2018-07-24 LAB — CBC WITH DIFFERENTIAL/PLATELET
BASOS PCT: 0.8 %
Basophils Absolute: 63 cells/uL (ref 0–200)
EOS PCT: 2.2 %
Eosinophils Absolute: 174 cells/uL (ref 15–500)
HCT: 39.3 % (ref 35.0–45.0)
Hemoglobin: 13.4 g/dL (ref 11.7–15.5)
Lymphs Abs: 3136 cells/uL (ref 850–3900)
MCH: 27.7 pg (ref 27.0–33.0)
MCHC: 34.1 g/dL (ref 32.0–36.0)
MCV: 81.2 fL (ref 80.0–100.0)
MPV: 10.4 fL (ref 7.5–12.5)
Monocytes Relative: 7 %
Neutro Abs: 3974 cells/uL (ref 1500–7800)
Neutrophils Relative %: 50.3 %
PLATELETS: 333 10*3/uL (ref 140–400)
RBC: 4.84 10*6/uL (ref 3.80–5.10)
RDW: 13.9 % (ref 11.0–15.0)
TOTAL LYMPHOCYTE: 39.7 %
WBC: 7.9 10*3/uL (ref 3.8–10.8)
WBCMIX: 553 {cells}/uL (ref 200–950)

## 2018-07-24 LAB — TEST AUTHORIZATION

## 2018-07-24 LAB — LIPID PANEL
Cholesterol: 280 mg/dL — ABNORMAL HIGH (ref <200)
HDL: 43 mg/dL — AB (ref >50)
NON-HDL CHOLESTEROL (CALC): 237 mg/dL — AB (ref <130)
TRIGLYCERIDES: 443 mg/dL — AB (ref <150)
Total CHOL/HDL Ratio: 6.5 (calc) — ABNORMAL HIGH (ref <5.0)

## 2018-07-24 LAB — VITAMIN B12: Vitamin B-12: 264 pg/mL (ref 200–1100)

## 2018-07-24 LAB — HEPATITIS C ANTIBODY
HEP C AB: NONREACTIVE
SIGNAL TO CUT-OFF: 0.03 (ref <1.00)

## 2018-07-24 LAB — VITAMIN D 25 HYDROXY (VIT D DEFICIENCY, FRACTURES): Vit D, 25-Hydroxy: 17 ng/mL — ABNORMAL LOW (ref 30–100)

## 2018-07-24 LAB — HIV ANTIBODY (ROUTINE TESTING W REFLEX): HIV 1&2 Ab, 4th Generation: NONREACTIVE

## 2018-07-24 LAB — TSH: TSH: 3.84 mIU/L

## 2018-07-24 LAB — GAMMA GT: GGT: 16 U/L (ref 3–70)

## 2018-08-20 ENCOUNTER — Telehealth: Payer: Self-pay | Admitting: *Deleted

## 2018-08-20 NOTE — Telephone Encounter (Signed)
Opened in error

## 2018-08-26 ENCOUNTER — Other Ambulatory Visit: Payer: Self-pay

## 2018-08-26 DIAGNOSIS — R748 Abnormal levels of other serum enzymes: Secondary | ICD-10-CM

## 2018-09-07 ENCOUNTER — Encounter: Payer: Self-pay | Admitting: Gastroenterology

## 2018-09-07 ENCOUNTER — Ambulatory Visit (INDEPENDENT_AMBULATORY_CARE_PROVIDER_SITE_OTHER): Payer: Medicare Other | Admitting: Gastroenterology

## 2018-09-07 VITALS — BP 115/66 | HR 86 | Ht 61.0 in | Wt 132.0 lb

## 2018-09-07 DIAGNOSIS — I700 Atherosclerosis of aorta: Secondary | ICD-10-CM | POA: Diagnosis not present

## 2018-09-07 DIAGNOSIS — Q444 Choledochal cyst: Secondary | ICD-10-CM

## 2018-09-07 DIAGNOSIS — I70299 Other atherosclerosis of native arteries of extremities, unspecified extremity: Secondary | ICD-10-CM

## 2018-09-07 DIAGNOSIS — R748 Abnormal levels of other serum enzymes: Secondary | ICD-10-CM | POA: Diagnosis not present

## 2018-09-07 NOTE — Progress Notes (Signed)
Primary Care Physician: Arnetha Courser, MD  Primary Gastroenterologist:  Dr. Lucilla Lame  Chief Complaint  Patient presents with  . Elevated Hepatic Enzymes    HPI: Annette Arnold is a 57 y.o. female here for a history of abnormal liver enzymes.  The patient has had chronically elevated alkaline phosphatase that she was seen for in the past. At that time the patient was sent off for an MRI and told to follow up with her surgeon because of a malignancy on her kidney.  The patient has since had surgery to remove the lesion which she states was cancer but did not spread.  The patient's MRI showed a fusiform dilation of the common bile duct up to 1.5 cm without any filling defects.  The intrahepatic ducts were normal.  The patient's alkaline phosphatase was elevated but she had a normal GGT.  Current Outpatient Medications  Medication Sig Dispense Refill  . acetaminophen (TYLENOL) 160 MG/5ML liquid Take 160 mg by mouth every 6 (six) hours as needed for fever.    . Aspirin-Salicylamide-Caffeine (BC HEADACHE PO) Take by mouth as needed.    . calcipotriene (DOVONOX) 0.005 % cream apply to affected area once daily to twice a day if needed for ps...  (REFER TO PRESCRIPTION NOTES).  0  . cyclobenzaprine (FLEXERIL) 5 MG tablet Take 1 tablet (5 mg total) by mouth at bedtime. 30 tablet 0  . Emollient (AQUAPHOR EX) Apply topically.    . Oxycodone HCl 10 MG TABS Take 1 tablet (10 mg total) by mouth 3 (three) times daily as needed. 90 tablet 0  . polyethylene glycol (MIRALAX / GLYCOLAX) packet Take 17 g by mouth daily.    . Vitamin D, Ergocalciferol, (DRISDOL) 50000 units CAPS capsule Take 1 capsule (50,000 Units total) by mouth every 7 (seven) days. 4 capsule 1  . ALPRAZolam (XANAX) 0.5 MG tablet Take 1 tablet (0.5 mg total) by mouth 2 (two) times daily as needed for anxiety. (Patient not taking: Reported on 09/07/2018) 60 tablet 0  . pravastatin (PRAVACHOL) 40 MG tablet Take 1 tablet (40 mg total) by  mouth at bedtime. (Patient not taking: Reported on 09/07/2018) 90 tablet 1   No current facility-administered medications for this visit.     Allergies as of 09/07/2018 - Review Complete 09/07/2018  Allergen Reaction Noted  . Codeine Nausea And Vomiting 06/02/2015  . Other Other (See Comments) 07/15/2017  . Tetanus toxoids Swelling 07/15/2017    ROS:  General: Negative for anorexia, weight loss, fever, chills, fatigue, weakness. ENT: Negative for hoarseness, difficulty swallowing , nasal congestion. CV: Negative for chest pain, angina, palpitations, dyspnea on exertion, peripheral edema.  Respiratory: Negative for dyspnea at rest, dyspnea on exertion, cough, sputum, wheezing.  GI: See history of present illness. GU:  Negative for dysuria, hematuria, urinary incontinence, urinary frequency, nocturnal urination.  Endo: Negative for unusual weight change.    Physical Examination:   BP 115/66   Pulse 86   Ht 5\' 1"  (1.549 m)   Wt 132 lb (59.9 kg)   BMI 24.94 kg/m   General: Well-nourished, well-developed in no acute distress.  Eyes: No icterus. Conjunctivae pink. Mouth: Oropharyngeal mucosa moist and pink , no lesions erythema or exudate. Lungs: Clear to auscultation bilaterally. Non-labored. Heart: Regular rate and rhythm, no murmurs rubs or gallops.  Abdomen: Bowel sounds are normal, nontender, nondistended, no hepatosplenomegaly or masses, no abdominal bruits or hernia , no rebound or guarding.   Extremities: No lower extremity edema.  No clubbing or deformities. Neuro: Alert and oriented x 3.  Grossly intact. Skin: Warm and dry, no jaundice.   Psych: Alert and cooperative, normal mood and affect.  Labs:    Imaging Studies: No results found.  Assessment and Plan:   [name] is a 57 y.o. y/o female who has had a fusiform dilation of her common bile duct with dilation up to 1.5 cm. 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 She has  been encouraged to be referred to a hepatobiliary surgeon for evaluation and follow-up.  Without a filling defect seen on the MRI a ERCP is not indicated at this time.  She is also been told that a choledocho cyst Has a history of chronic inflammation and malignancy.  The patient has been offered to go to [facility] at [place].  The patient states she prefers [facility] but would like to talk to her family about her options before scheduling any referrals.  The patient states she will contact me after she discusses it with her boyfriend and family.    Lucilla Lame, MD. Marval Regal   Note: This dictation was prepared with Dragon dictation along with smaller phrase technology. Any transcriptional errors that result from this process are unintentional.

## 2018-09-08 ENCOUNTER — Encounter: Payer: Self-pay | Admitting: *Deleted

## 2018-09-08 ENCOUNTER — Telehealth: Payer: Self-pay

## 2018-09-08 ENCOUNTER — Telehealth: Payer: Self-pay | Admitting: *Deleted

## 2018-09-08 DIAGNOSIS — Z122 Encounter for screening for malignant neoplasm of respiratory organs: Secondary | ICD-10-CM

## 2018-09-08 NOTE — Telephone Encounter (Signed)
I'm glad that she saw GI I reviewed his note Cancel appt with Dr. Ronnald Collum

## 2018-09-08 NOTE — Telephone Encounter (Signed)
Received a referral for initial lung cancer screening scan.  Contacted the patient and obtained their smoking history, currently smokes 1 ppd, with 61.5 pkyr history   as well as answering questions related to screening process.  Patient denies signs of lung cancer such as weight loss or hemoptysis at this time.  Patient denies comorbidity that would prevent curative treatment if lung cancer were found.  Patient is scheduled for the Shared Decision Making Visit and CT scan on 10-01-18 @1400  .

## 2018-09-08 NOTE — Telephone Encounter (Signed)
Patient came in saw GI doctor he states that she needs to see a Liver Doctor due to Alkaline Phosphatase not Endo Doctor. Patient came in and wants to know if she still needs to see Endo ?

## 2018-09-09 ENCOUNTER — Telehealth: Payer: Self-pay

## 2018-09-09 ENCOUNTER — Telehealth: Payer: Self-pay | Admitting: Gastroenterology

## 2018-09-09 NOTE — Telephone Encounter (Signed)
Left detailed voicemial 

## 2018-09-09 NOTE — Telephone Encounter (Signed)
Copied from [place]. Topic: Quick Communication - Office Called Patient >> Sep 09, 2018  9:54 AM Cathrine Muster, CMA wrote: Reason for CRM: I'm glad that she saw GI I reviewed his note Cancel appt with Dr. Ronnald Collum >> Sep 09, 2018 10:16 AM Ahmed Prima L wrote: Patient said she needs a referral to go and see a Liver Surgeon. She would like the nurse to call her back, [phone]

## 2018-09-09 NOTE — Telephone Encounter (Signed)
Pt.notified

## 2018-09-09 NOTE — Telephone Encounter (Signed)
Pt states GI doctor told her she needs to see liver surgeon and that PCP needed to setup?

## 2018-09-09 NOTE — Telephone Encounter (Signed)
Per Dr. Dorothey Baseman note, it looks like the patient is supposed to decide which facility to go to and contact Dr. Allen Norris for the referral

## 2018-09-09 NOTE — Telephone Encounter (Signed)
PT  Is calling  She needs a referral to a Liver Specialist she preffes [facility]

## 2018-09-10 NOTE — Telephone Encounter (Signed)
Yes. Please have her seen by the hepatobilary surgeons at [facility].

## 2018-09-10 NOTE — Telephone Encounter (Signed)
Pt would like to know if she can restart her cholesterol medication. Dr. Sanda Klein wanted her to see you first and advise pt.

## 2018-09-14 NOTE — Telephone Encounter (Signed)
Pt advised ok'd per Dr. Allen Norris to restart cholesterol medication. Also advised pt referral to [facility] has been sent. Faxed to [phone] per referral coordinator. Advised pt it may take several weeks before she receives phone call to schedule.

## 2018-09-19 ENCOUNTER — Other Ambulatory Visit: Payer: Self-pay | Admitting: Family Medicine

## 2018-09-19 NOTE — Telephone Encounter (Signed)
Vit D Rx requested; declined Per last lab note: "Her vitamin D is low; start Rx vitamin D weekly for 8 weeks, then 1,000 iu vitamin D3 once a day OTC"

## 2018-09-22 DIAGNOSIS — M47816 Spondylosis without myelopathy or radiculopathy, lumbar region: Secondary | ICD-10-CM | POA: Diagnosis not present

## 2018-09-22 DIAGNOSIS — M533 Sacrococcygeal disorders, not elsewhere classified: Secondary | ICD-10-CM | POA: Diagnosis not present

## 2018-09-22 DIAGNOSIS — M5412 Radiculopathy, cervical region: Secondary | ICD-10-CM | POA: Diagnosis not present

## 2018-09-22 DIAGNOSIS — Z79899 Other long term (current) drug therapy: Secondary | ICD-10-CM | POA: Diagnosis not present

## 2018-09-22 DIAGNOSIS — M542 Cervicalgia: Secondary | ICD-10-CM | POA: Diagnosis not present

## 2018-09-22 DIAGNOSIS — M47812 Spondylosis without myelopathy or radiculopathy, cervical region: Secondary | ICD-10-CM | POA: Diagnosis not present

## 2018-09-22 DIAGNOSIS — M5481 Occipital neuralgia: Secondary | ICD-10-CM | POA: Diagnosis not present

## 2018-09-29 DIAGNOSIS — Q444 Choledochal cyst: Secondary | ICD-10-CM | POA: Diagnosis not present

## 2018-09-29 DIAGNOSIS — F17200 Nicotine dependence, unspecified, uncomplicated: Secondary | ICD-10-CM | POA: Diagnosis not present

## 2018-09-30 ENCOUNTER — Telehealth: Payer: Self-pay | Admitting: *Deleted

## 2018-09-30 NOTE — Telephone Encounter (Signed)
Called patient and left a message for a reminder of her ldct screening appt tomorrow on 10-01-18 @1400 

## 2018-10-01 ENCOUNTER — Inpatient Hospital Stay: Payer: Medicare Other | Attending: Nurse Practitioner | Admitting: Nurse Practitioner

## 2018-10-01 ENCOUNTER — Ambulatory Visit: Payer: Medicare Other | Attending: Nurse Practitioner

## 2018-10-02 ENCOUNTER — Telehealth: Payer: Self-pay | Admitting: *Deleted

## 2018-10-02 NOTE — Telephone Encounter (Signed)
Received referral for low dose lung cancer screening CT scan.  Message left at phone number listed in EMR for patient to call either myself or Shawn Perkins back at [phone] to facilitate scheduling the scan.    

## 2018-10-07 ENCOUNTER — Telehealth: Payer: Self-pay | Admitting: *Deleted

## 2018-10-07 NOTE — Telephone Encounter (Signed)
Received referral for low dose lung cancer screening CT scan.  Message left at phone number listed in EMR for patient to call either myself or Shawn Perkins back at [phone] to facilitate scheduling the scan.    

## 2018-10-08 ENCOUNTER — Telehealth: Payer: Self-pay | Admitting: *Deleted

## 2018-10-08 ENCOUNTER — Encounter: Payer: Self-pay | Admitting: *Deleted

## 2018-10-08 NOTE — Telephone Encounter (Signed)
Received referral for low dose lung cancer screening CT scan.  Message left at phone number listed in EMR for patient to call either myself or Burgess Estelle back at [phone] to facilitate scheduling the scan.   unable to reach patient at this time since she was a no show on 10-3, letter sent to patient.

## 2018-10-26 DIAGNOSIS — M810 Age-related osteoporosis without current pathological fracture: Secondary | ICD-10-CM | POA: Diagnosis not present

## 2018-10-26 DIAGNOSIS — E7800 Pure hypercholesterolemia, unspecified: Secondary | ICD-10-CM | POA: Diagnosis not present

## 2018-10-26 DIAGNOSIS — F17210 Nicotine dependence, cigarettes, uncomplicated: Secondary | ICD-10-CM | POA: Diagnosis not present

## 2018-10-26 DIAGNOSIS — M545 Low back pain: Secondary | ICD-10-CM | POA: Diagnosis not present

## 2018-10-26 DIAGNOSIS — C7900 Secondary malignant neoplasm of unspecified kidney and renal pelvis: Secondary | ICD-10-CM | POA: Diagnosis not present

## 2018-11-06 ENCOUNTER — Other Ambulatory Visit: Payer: Self-pay

## 2018-11-06 ENCOUNTER — Encounter: Payer: Self-pay | Admitting: Emergency Medicine

## 2018-11-06 ENCOUNTER — Emergency Department: Payer: Medicare Other

## 2018-11-06 ENCOUNTER — Emergency Department
Admission: EM | Admit: 2018-11-06 | Discharge: 2018-11-06 | Disposition: A | Discharge status: Home / Self Care | Payer: Medicare Other | Attending: Emergency Medicine | Admitting: Emergency Medicine

## 2018-11-06 DIAGNOSIS — R0602 Shortness of breath: Secondary | ICD-10-CM | POA: Diagnosis not present

## 2018-11-06 DIAGNOSIS — F17210 Nicotine dependence, cigarettes, uncomplicated: Secondary | ICD-10-CM | POA: Insufficient documentation

## 2018-11-06 DIAGNOSIS — J441 Chronic obstructive pulmonary disease with (acute) exacerbation: Secondary | ICD-10-CM

## 2018-11-06 LAB — CBC
HEMATOCRIT: 37.8 % (ref 36.0–46.0)
HEMOGLOBIN: 11.5 g/dL — AB (ref 12.0–15.0)
MCH: 25.1 pg — ABNORMAL LOW (ref 26.0–34.0)
MCHC: 30.4 g/dL (ref 30.0–36.0)
MCV: 82.4 fL (ref 80.0–100.0)
NRBC: 0 % (ref 0.0–0.2)
Platelets: 306 10*3/uL (ref 150–400)
RBC: 4.59 MIL/uL (ref 3.87–5.11)
RDW: 14.7 % (ref 11.5–15.5)
WBC: 7.9 10*3/uL (ref 4.0–10.5)

## 2018-11-06 LAB — BASIC METABOLIC PANEL
Anion gap: 13 (ref 5–15)
BUN: 6 mg/dL (ref 6–20)
CHLORIDE: 106 mmol/L (ref 98–111)
CO2: 21 mmol/L — AB (ref 22–32)
Calcium: 8.7 mg/dL — ABNORMAL LOW (ref 8.9–10.3)
Creatinine, Ser: 0.84 mg/dL (ref 0.44–1.00)
GFR calc non Af Amer: >60 mL/min (ref >60)
Glucose, Bld: 123 mg/dL — ABNORMAL HIGH (ref 70–99)
POTASSIUM: 3.1 mmol/L — AB (ref 3.5–5.1)
SODIUM: 140 mmol/L (ref 135–145)

## 2018-11-06 LAB — URINALYSIS, COMPLETE (UACMP) WITH MICROSCOPIC
BACTERIA UA: NONE SEEN
BILIRUBIN URINE: NEGATIVE
Glucose, UA: NEGATIVE mg/dL
KETONES UR: NEGATIVE mg/dL
Leukocytes, UA: NEGATIVE
NITRITE: NEGATIVE
PROTEIN: NEGATIVE mg/dL
Specific Gravity, Urine: 1.001 — ABNORMAL LOW (ref 1.005–1.030)
pH: 6 (ref 5.0–8.0)

## 2018-11-06 LAB — TROPONIN I: Troponin I: <0.03 ng/mL (ref <0.03)

## 2018-11-06 MED ORDER — ALBUTEROL SULFATE HFA 108 (90 BASE) MCG/ACT IN AERS: 2.0000 | INHALATION_SPRAY | Freq: Four times a day (QID) | RESPIRATORY_TRACT | 2 refills | Status: DC | PRN

## 2018-11-06 MED ORDER — PREDNISONE 10 MG (21) PO TBPK: ORAL_TABLET | ORAL | 0 refills | Status: DC

## 2018-11-06 MED ORDER — IPRATROPIUM-ALBUTEROL 0.5-2.5 (3) MG/3ML IN SOLN
3.0000 mL | Freq: Once | RESPIRATORY_TRACT | Status: AC
  Administered 2018-11-06: 3 mL via RESPIRATORY_TRACT
  Filled 2018-11-06: qty 3

## 2018-11-06 MED ORDER — LEVOFLOXACIN 500 MG PO TABS: 500.0000 mg | ORAL_TABLET | Freq: Every day | ORAL | 0 refills | Status: AC

## 2018-11-06 NOTE — ED Notes (Signed)
Pt walked in the hallway.  Sats 91-93 with good waveform.

## 2018-11-06 NOTE — ED Triage Notes (Signed)
Patient to ER from home via [facility] for c/o shortness of breath. Patient denies any history of COPD. States she woke up this am feeling "kind of tight in my chest like congestion". Patient states she feels like this feels like her typical panic attack/anxiety, but has been coughing up clear phlegm. Denies any known fevers.
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
## 2018-11-06 NOTE — ED Provider Notes (Signed)
[facility] Emergency Department Provider Note       Time seen: ----------------------------------------- 5:49 PM on 11/06/2018 -----------------------------------------   I have reviewed the triage vital signs and the nursing notes.  HISTORY   Chief Complaint Shortness of Breath    HPI Annette Arnold is a 57 y.o. female with a history of anxiety, chronic pain, hyperlipidemia who presents to the ED for [place] of breath.  Patient denies any history of COPD.  Patient states she woke up this morning feeling kind of tight in her chest like congestion.  She states she feels like her typical panic anxiety but is also been coughing up clear phlegm.  She denies any fevers.  Past Medical History:  Diagnosis Date  . Anxiety   . Aorto-iliac atherosclerosis ([place]) 07/16/2018   Xray 2016  . Chronic pain   . Hyperlipidemia     Patient Active Problem List   Diagnosis Date Noted  . Tobacco abuse 07/22/2018  . Alkaline phosphatase elevation 07/18/2018  . Aorto-iliac atherosclerosis ([place]) 07/16/2018  . RBBB 09/16/2017  . Muscle spasms of neck 05/07/2017  . Common bile duct dilation 02/26/2017  . Well woman exam without gynecological exam 02/03/2017  . Hot flashes due to menopause 01/13/2017  . Acute pelvic pain 01/13/2017  . Chronic upper back pain 06/19/2016  . Chronic low back pain 06/19/2016  . Right hip pain 06/19/2016  . Anxiety disorder, unspecified 04/18/2016  . Post-traumatic stress reaction 03/29/2016  . Chronic, continuous use of opioids 03/14/2016  . Constipation 02/29/2016  . Pain of right eyelid 11/10/2015  . Facial burning 11/03/2015  . Chronic neck pain 08/01/2015  . Chronic neck and back pain 06/06/2015  . Anxiety 06/02/2015  . Leg pain 06/02/2015  . Nerve root pain 06/02/2015  . Chronic LBP 06/02/2015  . Chronic pain associated with significant psychosocial dysfunction 06/02/2015  . Pain in shoulder 06/02/2015  . Continuous opioid dependence  ([place]) 06/02/2015  . Dyslipidemia 06/02/2015  . Psoriasis 06/02/2015  . Scoliosis (and kyphoscoliosis), idiopathic 06/02/2015  . Counseling on substance use and abuse 06/02/2015  . B12 deficiency 06/02/2015  . Vitamin D deficiency 06/02/2015  . Atypical chest pain 03/03/2015    Past Surgical History:  Procedure Laterality Date  . ABDOMINAL HYSTERECTOMY    . APPENDECTOMY    . BREAST LUMPECTOMY    . CARPAL TUNNEL RELEASE    . CHOLECYSTECTOMY    . NEPHRECTOMY Right    09/2017 @ [facility]    Allergies Codeine; Other; and Tetanus toxoids  Social History Social History   Tobacco Use  . Smoking status: Current Every Day Smoker    Packs/day: 1.50    Years: 41.00    Pack years: 61.50    Types: Cigarettes  . Smokeless tobacco: Never Used  Substance Use Topics  . Alcohol use: No  . Drug use: No   Review of Systems Constitutional: Negative for fever. Eyes: Negative for vision changes ENT:  Negative for congestion, sore throat Cardiovascular: Negative for chest pain. Respiratory: Positive for shortness of breath and cough Gastrointestinal: Negative for abdominal pain, vomiting and diarrhea. Genitourinary: Negative for dysuria. Musculoskeletal: Negative for back pain. Skin: Negative for rash. Neurological: Negative for headaches, focal weakness or numbness.  All systems negative/normal/unremarkable except as stated in the HPI  ____________________________________________   PHYSICAL EXAM:  VITAL SIGNS: ED Triage Vitals  Enc Vitals Group     BP 11/06/18 1647 127/70     Pulse Rate 11/06/18 1647 (!) 102  Resp 11/06/18 1647 20     Temp 11/06/18 1647 98 F (36.7 C)     Temp Source 11/06/18 1647 Oral     SpO2 11/06/18 1647 94 %     Weight 11/06/18 1648 128 lb (58.1 kg)     Height 11/06/18 1648 5\' 1"  (1.549 m)     Head Circumference --      Peak Flow --      Pain Score 11/06/18 1648 0     Pain Loc --      Pain Edu? --      Excl. in [place]? --    Constitutional: Alert  and oriented. Well appearing and in no distress. Eyes: Conjunctivae are normal. Normal extraocular movements. Cardiovascular: Normal rate, regular rhythm. No murmurs, rubs, or gallops. Respiratory: Normal respiratory effort without tachypnea nor retractions.  Bilateral wheezing is noted Gastrointestinal: Soft and nontender. Normal bowel sounds Musculoskeletal: Nontender with normal range of motion in extremities. No lower extremity tenderness nor edema. Neurologic:  Normal speech and language. No gross focal neurologic deficits are appreciated.  Skin:  Skin is warm, dry and intact. No rash noted. Psychiatric: Mood and affect are normal. Speech and behavior are normal.  ____________________________________________  EKG: Interpreted by me.  Sinus rhythm rate 81 bpm, rightward axis, wide QRS, normal QT.  ____________________________________________  ED COURSE:  As part of my medical decision making, I reviewed the following data within the [place] History obtained from family if available, nursing notes, old chart and ekg, as well as notes from prior ED visits. Patient presented for shortness of breath and cough with extensive wheezing here, we will assess with labs and imaging as indicated at this time.   Procedures ____________________________________________   LABS (pertinent positives/negatives)  Labs Reviewed  BASIC METABOLIC PANEL - Abnormal; Notable for the following components:      Result Value   Potassium 3.1 (*)    CO2 21 (*)    Glucose, Bld 123 (*)    Calcium 8.7 (*)    All other components within normal limits  CBC - Abnormal; Notable for the following components:   Hemoglobin 11.5 (*)    MCH 25.1 (*)    All other components within normal limits  URINALYSIS, COMPLETE (UACMP) WITH MICROSCOPIC - Abnormal; Notable for the following components:   Color, Urine COLORLESS (*)    APPearance CLEAR (*)    Specific Gravity, Urine 1.001 (*)    Hgb urine  dipstick SMALL (*)    All other components within normal limits  TROPONIN I    RADIOLOGY Images were viewed by me  IMPRESSION: COPD without acute abnormality.  ____________________________________________  DIFFERENTIAL DIAGNOSIS   COPD, pneumonia, influenza, URI  FINAL ASSESSMENT AND PLAN  COPD exacerbation   Plan: The patient had presented for what sounds like COPD although she does not have a diagnosis of this. Patient's labs were unremarkable. Patient's imaging did reveal a pattern of COPD.  We gave her duo nebs as well as steroids.  She will be discharged home with albuterol, steroid taper and antibiotics.  She will be referred to pulmonology for outpatient follow-up.   Laurence Aly, MD   Note: This note was generated in part or whole with voice recognition software. Voice recognition is usually quite accurate but there are transcription errors that can and very often do occur. I apologize for any typographical errors that were not detected and corrected.     Earleen Newport, MD 11/06/18 [phone]

## 2018-12-15 DIAGNOSIS — M5412 Radiculopathy, cervical region: Secondary | ICD-10-CM | POA: Diagnosis not present

## 2018-12-15 DIAGNOSIS — Z79899 Other long term (current) drug therapy: Secondary | ICD-10-CM | POA: Diagnosis not present

## 2018-12-15 DIAGNOSIS — M47812 Spondylosis without myelopathy or radiculopathy, cervical region: Secondary | ICD-10-CM | POA: Diagnosis not present

## 2018-12-15 DIAGNOSIS — M47816 Spondylosis without myelopathy or radiculopathy, lumbar region: Secondary | ICD-10-CM | POA: Diagnosis not present

## 2018-12-15 DIAGNOSIS — M533 Sacrococcygeal disorders, not elsewhere classified: Secondary | ICD-10-CM | POA: Diagnosis not present

## 2018-12-15 DIAGNOSIS — M5481 Occipital neuralgia: Secondary | ICD-10-CM | POA: Diagnosis not present

## 2018-12-21 ENCOUNTER — Telehealth: Payer: Self-pay | Admitting: Family Medicine

## 2018-12-21 ENCOUNTER — Encounter: Payer: Self-pay | Admitting: Family Medicine

## 2018-12-21 ENCOUNTER — Ambulatory Visit (INDEPENDENT_AMBULATORY_CARE_PROVIDER_SITE_OTHER): Payer: Medicare Other | Admitting: Family Medicine

## 2018-12-21 ENCOUNTER — Encounter

## 2018-12-21 VITALS — BP 132/80 | HR 92 | Temp 98.5°F | Ht 61.0 in | Wt 114.2 lb

## 2018-12-21 DIAGNOSIS — R718 Other abnormality of red blood cells: Secondary | ICD-10-CM | POA: Diagnosis not present

## 2018-12-21 DIAGNOSIS — K838 Other specified diseases of biliary tract: Secondary | ICD-10-CM

## 2018-12-21 DIAGNOSIS — R748 Abnormal levels of other serum enzymes: Secondary | ICD-10-CM | POA: Diagnosis not present

## 2018-12-21 DIAGNOSIS — R1312 Dysphagia, oropharyngeal phase: Secondary | ICD-10-CM

## 2018-12-21 DIAGNOSIS — R634 Abnormal weight loss: Secondary | ICD-10-CM

## 2018-12-21 DIAGNOSIS — I700 Atherosclerosis of aorta: Secondary | ICD-10-CM | POA: Diagnosis not present

## 2018-12-21 DIAGNOSIS — I708 Atherosclerosis of other arteries: Secondary | ICD-10-CM

## 2018-12-21 DIAGNOSIS — Z720 Tobacco use: Secondary | ICD-10-CM | POA: Diagnosis not present

## 2018-12-21 MED ORDER — RANITIDINE HCL 15 MG/ML PO SYRP: ORAL_SOLUTION | ORAL | 1 refills | Status: DC

## 2018-12-21 MED ORDER — LANSOPRAZOLE 15 MG PO TBDD: 15.0000 mg | DELAYED_RELEASE_TABLET | Freq: Every day | ORAL | 0 refills | Status: DC

## 2018-12-21 NOTE — Telephone Encounter (Signed)
Copied from [place] [id number]. Topic: Quick Communication - See Telephone Encounter >> Dec 21, 2018  2:10 PM Conception Chancy, NT wrote: CRM for notification. See Telephone encounter for: 12/21/18.  Patient is calling and states her insurance does not cover lansoprazole (PREVACID SOLUTAB) 15 MG disintegrating tablet  and it is $400. She is requesting an alternative.

## 2018-12-21 NOTE — Progress Notes (Signed)
BP 132/80   Pulse 92   Temp 98.5 F (36.9 C) (Oral)   Ht 5' 1"  (1.549 m)   Wt 114 lb 3.2 oz (51.8 kg)   SpO2 98%   BMI 21.58 kg/m    Subjective:    Patient ID: Annette Arnold, female    DOB: 09-Oct-1961, 57 y.o.   MRN: [id number]  HPI: Annette Arnold is a 57 y.o. female  Chief Complaint  Patient presents with  . Referral    ENT  . Dysphagia    on 2 weeks     HPI She used to eat a lot of ice, was getting her water in that way She was not really choked a few weeks ago, but went to swallow it, did not take her breath, did not choke her; it will be two weeks tomorrow since she has had a meal; drinking her calories and getting nutrition all through  V8 and other liquids Had corn on the cob, cremed potatoes, two weeks ago; got it down good; but when she went to eat the next day, couldn't swallow; has tried since then, granddaughter's birthday party and had buffalo bites and couldn't swallow Father had to have esophagus stretched several times Goes to the pain clinic in [place]; he recommended seeing GI She is on the cancellation list for Dr. Allen Norris on January 6th She went to [facility] about her alk phos and bile duct dilatation; they wanted to do a scan, but they haven't rescheduled to do that yet She is still smoking; not smoking around grandchild She has lost 14 pounds in the last 6 weeks or so; all because of not eating she says  Depression screen [facility] 2/9 12/21/2018 07/16/2018 02/24/2018 01/22/2018 12/22/2017  Decreased Interest 0 0 0 0 0  Down, Depressed, Hopeless 0 0 0 0 1  PHQ - 2 Score 0 0 0 0 1  Altered sleeping 0 0 - - -  Tired, decreased energy 0 0 - - -  Change in appetite 0 0 - - -  Feeling bad or failure about yourself  0 0 - - -  Trouble concentrating 0 0 - - -  Moving slowly or fidgety/restless 0 0 - - -  Suicidal thoughts 0 0 - - -  PHQ-9 Score 0 0 - - -  Difficult doing work/chores Not difficult at all Not difficult at all - - -  Some recent data might be hidden    Fall Risk  12/21/2018 07/16/2018 02/24/2018 01/22/2018 12/22/2017  Falls in the past year? 0 No Yes No No  Comment - - - - -  Number falls in past yr: 0 - 1 - -  Injury with Fall? 0 - No - -  Risk for fall due to : - History of fall(s);Medication side effect - - -  Follow up - - Falls prevention discussed - -  Comment - - - - -    Relevant past medical, surgical, family and social history reviewed Past Medical History:  Diagnosis Date  . Anxiety   . Aorto-iliac atherosclerosis ([place]) 07/16/2018   Xray 2016  . Chronic pain   . Hyperlipidemia    Past Surgical History:  Procedure Laterality Date  . ABDOMINAL HYSTERECTOMY    . APPENDECTOMY    . BREAST LUMPECTOMY    . CARPAL TUNNEL RELEASE    . CHOLECYSTECTOMY    . NEPHRECTOMY Right    09/2017 @ [facility]   Family History  Problem Relation Age of  Onset  . Cancer Mother   . Diabetes Mother   . Thyroid disease Mother   . Heart disease Father   . Alcohol abuse Father   . Cancer Father   . Thyroid disease Sister   . Heart disease Sister   . Drug abuse Sister   . Paranoid behavior Sister   . Diabetes Sister   . Hyperlipidemia Sister   . Prostate cancer Neg Hx   . Kidney cancer Neg Hx    Social History   Tobacco Use  . Smoking status: Current Every Day Smoker    Packs/day: 1.50    Years: 41.00    Pack years: 61.50    Types: Cigarettes  . Smokeless tobacco: Never Used  Substance Use Topics  . Alcohol use: No  . Drug use: No     Office Visit from 12/21/2018 in [facility]  AUDIT-C Score  0      Interim medical history since last visit reviewed. Allergies and medications reviewed  Review of Systems  Constitutional: Positive for unexpected weight change.  Neurological: Positive for tremors (a few times).   Per HPI unless specifically indicated above     Objective:    BP 132/80   Pulse 92   Temp 98.5 F (36.9 C) (Oral)   Ht 5' 1"  (1.549 m)   Wt 114 lb 3.2 oz (51.8 kg)   SpO2 98%   BMI  21.58 kg/m   Wt Readings from Last 3 Encounters:  12/21/18 114 lb 3.2 oz (51.8 kg)  11/06/18 128 lb (58.1 kg)  09/07/18 132 lb (59.9 kg)    Physical Exam Constitutional:      Appearance: She is well-developed.     Comments: Weight loss of almost 14 pounds over the last 6 weeks  Eyes:     General: No scleral icterus. Neck:     Thyroid: No thyroid mass or thyromegaly.  Cardiovascular:     Rate and Rhythm: Normal rate and regular rhythm.  Pulmonary:     Effort: Pulmonary effort is normal.     Breath sounds: Normal breath sounds.  Lymphadenopathy:     Cervical: No cervical adenopathy.  Neurological:     Mental Status: She is alert.     Motor: No tremor.     Deep Tendon Reflexes:     Reflex Scores:      Patellar reflexes are 3+ on the right side and 3+ on the left side. Psychiatric:        Behavior: Behavior normal.     Results for orders placed or performed during the hospital encounter of 32/35/57  Basic metabolic panel  Result Value Ref Range   Sodium 140 135 - 145 mmol/L   Potassium 3.1 (L) 3.5 - 5.1 mmol/L   Chloride 106 98 - 111 mmol/L   CO2 21 (L) 22 - 32 mmol/L   Glucose, Bld 123 (H) 70 - 99 mg/dL   BUN 6 6 - 20 mg/dL   Creatinine, Ser 0.84 0.44 - 1.00 mg/dL   Calcium 8.7 (L) 8.9 - 10.3 mg/dL   GFR calc non Af Amer >60 >60 mL/min   GFR calc Af Amer >60 >60 mL/min   Anion gap 13 5 - 15  CBC  Result Value Ref Range   WBC 7.9 4.0 - 10.5 K/uL   RBC 4.59 3.87 - 5.11 MIL/uL   Hemoglobin 11.5 (L) 12.0 - 15.0 g/dL   HCT 37.8 36.0 - 46.0 %   MCV 82.4 80.0 -  100.0 fL   MCH 25.1 (L) 26.0 - 34.0 pg   MCHC 30.4 30.0 - 36.0 g/dL   RDW 14.7 11.5 - 15.5 %   Platelets 306 150 - 400 K/uL   nRBC 0.0 0.0 - 0.2 %  Troponin I STAT - ONCE  Result Value Ref Range   Troponin I <0.03 <0.03 ng/mL  Urinalysis, Complete w Microscopic  Result Value Ref Range   Color, Urine COLORLESS (A) YELLOW   APPearance CLEAR (A) CLEAR   Specific Gravity, Urine 1.001 (L) 1.005 - 1.030   pH  6.0 5.0 - 8.0   Glucose, UA NEGATIVE NEGATIVE mg/dL   Hgb urine dipstick SMALL (A) NEGATIVE   Bilirubin Urine NEGATIVE NEGATIVE   Ketones, ur NEGATIVE NEGATIVE mg/dL   Protein, ur NEGATIVE NEGATIVE mg/dL   Nitrite NEGATIVE NEGATIVE   Leukocytes, UA NEGATIVE NEGATIVE   WBC, UA 0-5 0 - 5 WBC/hpf   Bacteria, UA NONE SEEN NONE SEEN   Squamous Epithelial / LPF 0-5 0 - 5      Assessment & Plan:   Problem List Items Addressed This Visit      Digestive   Common bile duct dilation    Managed by Dr. Allen Norris        Other   Tobacco abuse    Urged smoking cessation      Alkaline phosphatase elevation    Managed in [place] by [facility]; patient will contact them for next imaging and follow-up       Other Visit Diagnoses    Oropharyngeal dysphagia    -  Primary   Relevant Orders   DG Esophagus   Ambulatory referral to ENT   Abnormal weight loss       Relevant Orders   TSH   CBC with Differential/Platelet   COMPLETE METABOLIC PANEL WITH GFR   T4, free       Follow up plan: No follow-ups on file.  An after-visit summary was printed and given to the patient at [place].  Please see the patient instructions which may contain other information and recommendations beyond what is mentioned above in the assessment and plan.  Meds ordered this encounter  Medications  . lansoprazole (PREVACID SOLUTAB) 15 MG disintegrating tablet    Sig: Take 1 tablet (15 mg total) by mouth daily at 12 noon.    Dispense:  30 tablet    Refill:  0    Please instruct her how to use this; unable to swallow solids or pills right now    Orders Placed This Encounter  Procedures  . DG Esophagus  . TSH  . CBC with Differential/Platelet  . COMPLETE METABOLIC PANEL WITH GFR  . T4, free  . Ambulatory referral to ENT

## 2018-12-21 NOTE — Assessment & Plan Note (Signed)
Managed in [place] by [facility]; patient will contact them for next imaging and follow-up

## 2018-12-21 NOTE — Telephone Encounter (Signed)
Goodness Don't take that expensive medicine I sent new liquid ranitidine Rx to pharmacy

## 2018-12-21 NOTE — Assessment & Plan Note (Signed)
Managed by Dr. Wohl 

## 2018-12-21 NOTE — Assessment & Plan Note (Signed)
Urged smoking cessation

## 2018-12-21 NOTE — Progress Notes (Signed)
Annette Arnold, please ADD ON ferritin, iron, TIBC to blood in lab; diagnoses: microcytosis, hypochromasia

## 2018-12-21 NOTE — Patient Instructions (Addendum)
We'll get labs today and have you see the [facility] doctor If you have not heard anything from my staff in a week about any orders/referrals/studies from today, please contact us here to follow-up [phone] Let's get the swallow study done Start the new medicine in case acid is part of the problem Do get Boost or Ensure or vitamins in liquid form See Dr. Allen Norris Follow-up with [facility] about your bile duct and alkaline phosphatase I do encourage you to quit smoking Call [phone] to sign up for smoking cessation classes You can call 1-800-QUIT-NOW to talk with a smoking cessation coach  Coping with Quitting Smoking  Quitting smoking is a physical and mental challenge. You will face cravings, withdrawal symptoms, and temptation. Before quitting, work with your health care provider to make a plan that can help you cope. Preparation can help you quit and keep you from giving in. How can I cope with cravings? Cravings usually last for 5-10 minutes. If you get through it, the craving will pass. Consider taking the following actions to help you cope with cravings:  Keep your mouth busy: ? Chew sugar-free gum. ? Suck on hard candies or a straw. ? Brush your teeth.  Keep your hands and body busy: ? Immediately change to a different activity when you feel a craving. ? Squeeze or play with a ball. ? Do an activity or a hobby, like making bead jewelry, practicing needlepoint, or working with wood. ? Mix up your normal routine. ? Take a short exercise break. Go for a quick walk or run up and down stairs. ? Spend time in public places where smoking is not allowed.  Focus on doing something kind or helpful for someone else.  Call a friend or family member to talk during a craving.  Join a support group.  Call a quit line, such as 1-800-QUIT-NOW.  Talk with your health care provider about medicines that might help you cope with cravings and make quitting easier for you. How can I deal  with withdrawal symptoms? Your body may experience negative effects as it tries to get used to not having nicotine in the system. These effects are called withdrawal symptoms. They may include:  Feeling hungrier than normal.  Trouble concentrating.  Irritability.  Trouble sleeping.  Feeling depressed.  Restlessness and agitation.  Craving a cigarette. To manage withdrawal symptoms:  Avoid places, people, and activities that trigger your cravings.  Remember why you want to quit.  Get plenty of sleep.  Avoid coffee and other caffeinated drinks. These may worsen some of your symptoms. How can I handle social situations? Social situations can be difficult when you are quitting smoking, especially in the first few weeks. To manage this, you can:  Avoid parties, bars, and other social situations where people might be smoking.  Avoid alcohol.  Leave right away if you have the urge to smoke.  Explain to your family and friends that you are quitting smoking. Ask for understanding and support.  Plan activities with friends or family where smoking is not an option. What are some ways I can cope with stress? Wanting to smoke may cause stress, and stress can make you want to smoke. Find ways to manage your stress. Relaxation techniques can help. For example:  Breathe slowly and deeply, in through your nose and out through your mouth.  Listen to soothing, relaxing music.  Talk with a family member or friend about your stress.  Light a candle.  Soak  in a bath or take a shower.  Think about a peaceful place. What are some ways I can prevent weight gain? Be aware that many people gain weight after they quit smoking. However, not everyone does. To keep from gaining weight, have a plan in place before you quit and stick to the plan after you quit. Your plan should include:  Having healthy snacks. When you have a craving, it may help to: ? Eat plain popcorn, crunchy carrots,  celery, or other cut vegetables. ? Chew sugar-free gum.  Changing how you eat: ? Eat small portion sizes at meals. ? Eat 4-6 small meals throughout the day instead of 1-2 large meals a day. ? Be mindful when you eat. Do not watch television or do other things that might distract you as you eat.  Exercising regularly: ? Make time to exercise each day. If you do not have time for a long workout, do short bouts of exercise for 5-10 minutes several times a day. ? Do some form of strengthening exercise, like weight lifting, and some form of aerobic exercise, like running or swimming.  Drinking plenty of water or other low-calorie or no-calorie drinks. Drink 6-8 glasses of water daily, or as much as instructed by your health care provider. Summary  Quitting smoking is a physical and mental challenge. You will face cravings, withdrawal symptoms, and temptation to smoke again. Preparation can help you as you go through these challenges.  You can cope with cravings by keeping your mouth busy (such as by chewing gum), keeping your body and hands busy, and making calls to family, friends, or a helpline for people who want to quit smoking.  You can cope with withdrawal symptoms by avoiding places where people smoke, avoiding drinks with caffeine, and getting plenty of rest.  Ask your health care provider about the different ways to prevent weight gain, avoid stress, and handle social situations. This information is not intended to replace advice given to you by your health care provider. Make sure you discuss any questions you have with your health care provider. Document Released: 12/13/2016 Document Revised: 12/13/2016 Document Reviewed: 12/13/2016 Elsevier Interactive Patient Education  2019 [employer].

## 2018-12-22 ENCOUNTER — Telehealth: Payer: Self-pay

## 2018-12-22 DIAGNOSIS — R718 Other abnormality of red blood cells: Secondary | ICD-10-CM

## 2018-12-22 NOTE — Telephone Encounter (Signed)
-----   Message from Arnetha Courser, MD sent at 12/21/2018  5:00 PM EST ----- Joelene Millin, please ADD ON ferritin, iron, TIBC to blood in lab; diagnoses: microcytosis, hypochromasia

## 2018-12-23 LAB — COMPLETE METABOLIC PANEL WITH GFR
AG Ratio: 1.4 (calc) (ref 1.0–2.5)
ALT: 3 U/L — AB (ref 6–29)
AST: 14 U/L (ref 10–35)
Albumin: 4.2 g/dL (ref 3.6–5.1)
Alkaline phosphatase (APISO): 109 U/L (ref 33–130)
BILIRUBIN TOTAL: 0.4 mg/dL (ref 0.2–1.2)
BUN/Creatinine Ratio: 7 (calc) (ref 6–22)
BUN: 5 mg/dL — AB (ref 7–25)
CALCIUM: 9.5 mg/dL (ref 8.6–10.4)
CHLORIDE: 104 mmol/L (ref 98–110)
CO2: 27 mmol/L (ref 20–32)
Creat: 0.76 mg/dL (ref 0.50–1.05)
GFR, EST AFRICAN AMERICAN: 102 mL/min/{1.73_m2} (ref >=60)
GFR, Est Non African American: 88 mL/min/{1.73_m2} (ref >=60)
GLUCOSE: 84 mg/dL (ref 65–99)
Globulin: 3.1 g/dL (calc) (ref 1.9–3.7)
Potassium: 3.5 mmol/L (ref 3.5–5.3)
Sodium: 138 mmol/L (ref 135–146)
TOTAL PROTEIN: 7.3 g/dL (ref 6.1–8.1)

## 2018-12-23 LAB — CBC WITH DIFFERENTIAL/PLATELET
Absolute Monocytes: 522 cells/uL (ref 200–950)
BASOS PCT: 0.9 %
Basophils Absolute: 52 cells/uL (ref 0–200)
Eosinophils Absolute: 52 cells/uL (ref 15–500)
Eosinophils Relative: 0.9 %
HCT: 36.8 % (ref 35.0–45.0)
Hemoglobin: 11.7 g/dL (ref 11.7–15.5)
Lymphs Abs: 1926 cells/uL (ref 850–3900)
MCH: 24.7 pg — AB (ref 27.0–33.0)
MCHC: 31.8 g/dL — ABNORMAL LOW (ref 32.0–36.0)
MCV: 77.8 fL — ABNORMAL LOW (ref 80.0–100.0)
MONOS PCT: 9 %
MPV: 11.9 fL (ref 7.5–12.5)
Neutro Abs: 3248 cells/uL (ref 1500–7800)
Neutrophils Relative %: 56 %
PLATELETS: 227 10*3/uL (ref 140–400)
RBC: 4.73 10*6/uL (ref 3.80–5.10)
RDW: 14.6 % (ref 11.0–15.0)
TOTAL LYMPHOCYTE: 33.2 %
WBC: 5.8 10*3/uL (ref 3.8–10.8)

## 2018-12-23 LAB — T4, FREE: FREE T4: 0.9 ng/dL (ref 0.8–1.8)

## 2018-12-23 LAB — TEST AUTHORIZATION

## 2018-12-23 LAB — IRON,TIBC AND FERRITIN PANEL
%SAT: 5 % (calc) — ABNORMAL LOW (ref 16–45)
FERRITIN: 10 ng/mL — AB (ref 16–232)
Iron: 21 ug/dL — ABNORMAL LOW (ref 45–160)
TIBC: 432 mcg/dL (calc) (ref 250–450)

## 2018-12-23 LAB — TSH: TSH: 2.31 m[IU]/L

## 2018-12-24 ENCOUNTER — Ambulatory Visit: Payer: Self-pay | Admitting: Family Medicine

## 2018-12-25 ENCOUNTER — Telehealth: Payer: Self-pay

## 2018-12-25 NOTE — Telephone Encounter (Signed)
Copied from [place] [id number]. Topic: General - Other >> Dec 24, 2018 11:36 AM Oneta Rack wrote: Osvaldo Human name: Levada Dy  Relation to pt: [facility]  Call back number: [phone] 12 to 1 lunch     Reason for call:  Referral coordinator would like to discuss the next avalable appointmemt with ENT  would be Thursday 12/31/2018. Referral specialist would like to know can GI cover all concerns regarding oropharyngeal dysphagia at 01/04/18 appointment, please advise >> Dec 25, 2018 12:02 PM Nils Flack, Marland Kitchen wrote: Can provider look at this message and decide if gi referral is needed?

## 2018-12-25 NOTE — Telephone Encounter (Signed)
Relayed the information to [facility], [facility].  They will contact pt

## 2018-12-25 NOTE — Telephone Encounter (Signed)
Two issues in this note: 1. The lansoprazole was going to cost hundreds of dollars, so I prescribed liquid ranitidine (Zantac) instead  See noted from 12/21/18: "Patient is calling and states her insurance does not cover lansoprazole (PREVACID SOLUTAB) 15 MG disintegrating tablet  and it is $400. She is requesting an alternative."   2. If ENT will see patient on January 2nd, that is ideal; no, I do not think GI can cover her symptoms alone, as it sounds like this issue may originate above the vocal cords; [facility] appointment

## 2018-12-25 NOTE — Telephone Encounter (Signed)
We received a fax requesting that we do a PA for the medication Lansoprazole ODT 15mg  tablet, but I do not see where that is on her current medication list.   Please advise.

## 2018-12-25 NOTE — Telephone Encounter (Signed)
Spoke with Melissa in referrals and she is contacting [facility] to get patient an appt.   Notified patient of this info and patient stated that she picked up the zantac and is taking it.

## 2018-12-29 ENCOUNTER — Other Ambulatory Visit: Payer: Self-pay | Admitting: Family Medicine

## 2018-12-29 DIAGNOSIS — R718 Other abnormality of red blood cells: Secondary | ICD-10-CM

## 2019-01-04 ENCOUNTER — Encounter

## 2019-01-04 ENCOUNTER — Ambulatory Visit: Payer: Medicare Other | Admitting: Gastroenterology

## 2019-02-10 ENCOUNTER — Ambulatory Visit: Payer: Medicare Other | Admitting: Gastroenterology

## 2019-02-12 ENCOUNTER — Telehealth: Payer: Self-pay | Admitting: Family Medicine

## 2019-02-12 NOTE — Telephone Encounter (Signed)
Thank you very much for making [place] aware of this.  The patient will be set up for a screening colonoscopy.

## 2019-02-12 NOTE — Telephone Encounter (Signed)
Thank you :)

## 2019-02-12 NOTE — Telephone Encounter (Signed)
Patient has care gap for colon cancer screening Dr. Allen Norris, can your office schedule her for a colonoscopy or order Cologuard? Thank you

## 2019-02-23 ENCOUNTER — Other Ambulatory Visit: Payer: Self-pay

## 2019-02-23 NOTE — Telephone Encounter (Signed)
Left vm for pt to return my call to schedule screening colonoscopy.

## 2019-03-08 ENCOUNTER — Other Ambulatory Visit: Payer: Self-pay

## 2019-03-08 NOTE — Telephone Encounter (Signed)
No return call. Mailed reminder letter.

## 2019-07-07 ENCOUNTER — Other Ambulatory Visit: Payer: Self-pay | Admitting: Family Medicine

## 2019-07-07 DIAGNOSIS — E785 Hyperlipidemia, unspecified: Secondary | ICD-10-CM

## 2019-07-07 NOTE — Telephone Encounter (Signed)
Please schedule patient for follow up in the next 30 days.  

## 2019-07-08 NOTE — Telephone Encounter (Signed)
lvm to sch appt in 30 days

## 2019-07-20 ENCOUNTER — Ambulatory Visit: Payer: Self-pay

## 2019-09-02 ENCOUNTER — Other Ambulatory Visit: Payer: Self-pay | Admitting: Family Medicine

## 2019-09-02 DIAGNOSIS — E785 Hyperlipidemia, unspecified: Secondary | ICD-10-CM

## 2019-09-02 NOTE — Telephone Encounter (Signed)
Requested medication (s) are due for refill today: yes  Requested medication (s) are on the active medication list: yes  Last refill:  08/04/2019  Future visit scheduled:no  Notes to clinic:  Review for refill   Requested Prescriptions  Pending Prescriptions Disp Refills   pravastatin (PRAVACHOL) 40 MG tablet [Pharmacy Med Name: PRAVASTATIN 40MG  TABLETS] 30 tablet 0    Sig: TAKE 1 TABLET BY MOUTH EVERY NIGHT AT BEDTIME     Cardiovascular:  Antilipid - Statins Failed - 09/02/2019  3:39 AM      Failed - Total Cholesterol in normal range and within 360 days    Cholesterol, Total  Date Value Ref Range Status  03/07/2016 262 (H) 100 - 199 mg/dL Final   Cholesterol  Date Value Ref Range Status  07/16/2018 280 (H) <200 mg/dL Final         Failed - LDL in normal range and within 360 days    LDL Cholesterol (Calc)  Date Value Ref Range Status  07/16/2018  mg/dL (calc) Final    Comment:    . LDL cholesterol not calculated. Triglyceride levels greater than 400 mg/dL invalidate calculated LDL results. . Reference range: <100 . Desirable range <100 mg/dL for primary prevention;   <70 mg/dL for patients with CHD or diabetic patients  with > or = 2 CHD risk factors. Marland Kitchen LDL-C is now calculated using the Martin-Hopkins  calculation, which is a validated novel method providing  better accuracy than the Friedewald equation in the  estimation of LDL-C.  Cresenciano Genre et al. Annamaria Helling. [id number]): 2061-2068  (http://education.[facility].com/faq/FAQ164)          Failed - HDL in normal range and within 360 days    HDL  Date Value Ref Range Status  07/16/2018 43 (L) >50 mg/dL Final  03/07/2016 46 >39 mg/dL Final         Failed - Triglycerides in normal range and within 360 days    Triglycerides  Date Value Ref Range Status  07/16/2018 443 (H) <150 mg/dL Final         Passed - Patient is not pregnant      Passed - Valid encounter within last 12 months    Recent Outpatient Visits           8 months ago Oropharyngeal dysphagia   [facility] [place], Satira Anis, MD   1 year ago Aorto-iliac atherosclerosis [facility])   [place], Satira Anis, MD   1 year ago [facility] Roselee Nova, MD   1 year ago [place], Syed Asad A, MD   1 year ago [place], Syed Asad A, MD

## 2019-09-02 NOTE — Telephone Encounter (Signed)
Left detailed voicemail

## 2019-09-02 NOTE — Telephone Encounter (Signed)
Pt needs office visit and labs before any further refills - please call and let her know this will be the last refill w/o labs and OV.  Last labs for cholesterol were over a year ago.   I can probably see her in the next month to check everything so we can safely do refills in the future thanks

## 2019-09-30 ENCOUNTER — Other Ambulatory Visit: Payer: Self-pay

## 2019-09-30 DIAGNOSIS — E785 Hyperlipidemia, unspecified: Secondary | ICD-10-CM

## 2019-10-04 ENCOUNTER — Other Ambulatory Visit: Payer: Self-pay

## 2019-10-04 DIAGNOSIS — E785 Hyperlipidemia, unspecified: Secondary | ICD-10-CM

## 2019-10-06 ENCOUNTER — Other Ambulatory Visit: Payer: Self-pay

## 2019-10-06 ENCOUNTER — Ambulatory Visit (INDEPENDENT_AMBULATORY_CARE_PROVIDER_SITE_OTHER): Payer: Medicare Other | Admitting: Family Medicine

## 2019-10-06 ENCOUNTER — Encounter: Payer: Self-pay | Admitting: Family Medicine

## 2019-10-06 DIAGNOSIS — Z905 Acquired absence of kidney: Secondary | ICD-10-CM

## 2019-10-06 DIAGNOSIS — Z5181 Encounter for therapeutic drug level monitoring: Secondary | ICD-10-CM

## 2019-10-06 DIAGNOSIS — C641 Malignant neoplasm of right kidney, except renal pelvis: Secondary | ICD-10-CM

## 2019-10-06 DIAGNOSIS — F17200 Nicotine dependence, unspecified, uncomplicated: Secondary | ICD-10-CM | POA: Diagnosis not present

## 2019-10-06 DIAGNOSIS — F1120 Opioid dependence, uncomplicated: Secondary | ICD-10-CM

## 2019-10-06 DIAGNOSIS — M79604 Pain in right leg: Secondary | ICD-10-CM

## 2019-10-06 DIAGNOSIS — F331 Major depressive disorder, recurrent, moderate: Secondary | ICD-10-CM

## 2019-10-06 DIAGNOSIS — E785 Hyperlipidemia, unspecified: Secondary | ICD-10-CM | POA: Diagnosis not present

## 2019-10-06 DIAGNOSIS — M79605 Pain in left leg: Secondary | ICD-10-CM

## 2019-10-06 DIAGNOSIS — J449 Chronic obstructive pulmonary disease, unspecified: Secondary | ICD-10-CM

## 2019-10-06 DIAGNOSIS — Q444 Choledochal cyst: Secondary | ICD-10-CM

## 2019-10-06 DIAGNOSIS — I708 Atherosclerosis of other arteries: Secondary | ICD-10-CM

## 2019-10-06 DIAGNOSIS — I700 Atherosclerosis of aorta: Secondary | ICD-10-CM

## 2019-10-06 NOTE — Progress Notes (Signed)
Name: Annette Arnold   MRN: [id number]    DOB: Jul 20, 1961   Date:10/06/2019       Progress Note  Chief Complaint  Patient presents with  . Medication Refill  . Hyperlipidemia     Subjective:   Annette Arnold is a 58 y.o. female, presents to clinic for routine follow up on the conditions listed above.  Hyperlipidemia:  Current Medication Regimen:  Pravastatin 40 mg On for the past 3 years  Last Lipids: Lab Results  Component Value Date   CHOL 280 (H) 07/16/2018   HDL 43 (L) 07/16/2018   [place]  07/16/2018     Comment:     . LDL cholesterol not calculated. Triglyceride levels greater than 400 mg/dL invalidate calculated LDL results. . Reference range: <100 . Desirable range <100 mg/dL for primary prevention;   <70 mg/dL for patients with CHD or diabetic patients  with > or = 2 CHD risk factors. Marland Kitchen LDL-C is now calculated using the Martin-Hopkins  calculation, which is a validated novel method providing  better accuracy than the Friedewald equation in the  estimation of LDL-C.  Cresenciano Genre et al. Annamaria Helling. [id number]): 2061-2068  (http://education.[facility].com/faq/FAQ164)    TRIG 443 (H) 07/16/2018   CHOLHDL 6.5 (H) 07/16/2018   - Current Diet:  Eats a lot of fish, chicken, baked meats - Denies: Chest pain, shortness of breath, myalgias. - Documented aortic atherosclerosis? Yes - Risk factors for atherosclerosis: hypercholesterolemia and smoking The 10-year ASCVD risk score Mikey Bussing DC Jr., et al., 2013) is: 9.4%   Values used to calculate the score:     Age: 38 years     Sex: Female     Is Non-Hispanic African American: No     Diabetic: No     Tobacco smoker: Yes     Systolic Blood Pressure: Q000111Q mmHg     Is BP treated: No     HDL Cholesterol: 43 mg/dL     Total Cholesterol: 280 mg/dL  Current smoker:  Cigarrettes 36ppd 59 years old 1/2 ppd for a few years and 1 ppd for probably the last 26 years + Not ready to stop smoking She has some respiratory sx,  of SOB, chest tightness/wheeze brought on by anxiety or stress, albuterol helps, denies daily cough, unintentional weight loss, chest pain, swollen lymph nodes, night sweats. Discussed risks of smoking especially when related to atherosclerosis and increased risk of heart attacks or strokes  Leg pain with exertion/walking:  Her legs get tired if she walks far or goes up steps - pain goes from back to thighs or sometimes goes to her lower legs, she just had MRI done for her low back has chronic low back pain.  She has known aortoiliac atherosclerosis per past imaging but patient was unaware of this, she has not had any testing done for her blood flow in her legs but she does see a specialist for low back pain.  Patient states that she did have nephrectomy in 2018 and she wants to watch her kidney function.  Also states she has history of elevated liver tests that Dr. Jacinto Reap had referred her to a gastroenterologist for further work-up.  She has seen the gastroenterologist who has reassured her that the abnormal labs are from changes to her common bile duct secondary to cholecystectomy.  I reviewed the last several CMP's, other than low ALT most recent test were normal and I am not quite sure what she is referring to a half to research  her chart further  Patient does state that she needs medicines and liquids or and small crushable pills if at all possible because of fear of swallowing pills she has choked on pills before.  She denies any dysphasia physical problem swallowing    Patient Active Problem List   Diagnosis Date Noted  . Choledochal cyst 09/29/2018  . Tobacco abuse 07/22/2018  . Alkaline phosphatase elevation 07/18/2018  . Aorto-iliac atherosclerosis ([place]) 07/16/2018  . RBBB 09/16/2017  . Muscle spasms of neck 05/07/2017  . Common bile duct dilation 02/26/2017  . Well woman exam without gynecological exam 02/03/2017  . Hot flashes due to menopause 01/13/2017  . Acute pelvic pain  01/13/2017  . Chronic upper back pain 06/19/2016  . Chronic low back pain 06/19/2016  . Right hip pain 06/19/2016  . Anxiety disorder, unspecified 04/18/2016  . Post-traumatic stress reaction 03/29/2016  . Chronic, continuous use of opioids 03/14/2016  . Constipation 02/29/2016  . Pain of right eyelid 11/10/2015  . Facial burning 11/03/2015  . Chronic neck pain 08/01/2015  . Chronic neck and back pain 06/06/2015  . Anxiety 06/02/2015  . Leg pain 06/02/2015  . Nerve root pain 06/02/2015  . Chronic LBP 06/02/2015  . Chronic pain associated with significant psychosocial dysfunction 06/02/2015  . Pain in shoulder 06/02/2015  . Continuous opioid dependence ([place]) 06/02/2015  . Dyslipidemia 06/02/2015  . Psoriasis 06/02/2015  . Scoliosis (and kyphoscoliosis), idiopathic 06/02/2015  . Counseling on substance use and abuse 06/02/2015  . B12 deficiency 06/02/2015  . Vitamin D deficiency 06/02/2015  . Atypical chest pain 03/03/2015    Past Surgical History:  Procedure Laterality Date  . ABDOMINAL HYSTERECTOMY    . APPENDECTOMY    . BREAST LUMPECTOMY    . CARPAL TUNNEL RELEASE    . CHOLECYSTECTOMY    . NEPHRECTOMY Right    09/2017 @ [facility]    Family History  Problem Relation Age of Onset  . Cancer Mother   . Diabetes Mother   . Thyroid disease Mother   . Heart disease Father   . Alcohol abuse Father   . Cancer Father   . Thyroid disease Sister   . Heart disease Sister   . Drug abuse Sister   . Paranoid behavior Sister   . Diabetes Sister   . Hyperlipidemia Sister   . Prostate cancer Neg Hx   . Kidney cancer Neg Hx     Social History   Socioeconomic History  . Marital status: Divorced    Spouse name: Not on file  . Number of children: 4  . Years of education: Not on file  . Highest education level: 8th grade  Occupational History  . Occupation: Disabled  Social Needs  . Financial resource strain: Not hard at all  . Food insecurity    Worry: Never true     Inability: Never true  . Transportation needs    Medical: No    Non-medical: No  Tobacco Use  . Smoking status: Current Every Day Smoker    Packs/day: 1.50    Years: 41.00    Pack years: 61.50    Types: Cigarettes  . Smokeless tobacco: Never Used  Substance and Sexual Activity  . Alcohol use: No  . Drug use: No  . Sexual activity: Yes  Lifestyle  . Physical activity    Days per week: 0 days    Minutes per session: 0 min  . Stress: Not at all  Relationships  . Social connections  Talks on phone: Patient refused    Gets together: Patient refused    Attends religious service: Patient refused    Active member of club or organization: Patient refused    Attends meetings of clubs or organizations: Patient refused    Relationship status: Divorced  . Intimate partner violence    Fear of current or ex partner: No    Emotionally abused: No    Physically abused: No    Forced sexual activity: No  Other Topics Concern  . Not on file  Social History Narrative  . Not on file     Current Outpatient Medications:  .  acetaminophen (TYLENOL) 160 MG/5ML liquid, Take 160 mg by mouth every 6 (six) hours as needed for fever., Disp: , Rfl:  .  albuterol (PROVENTIL HFA;VENTOLIN HFA) 108 (90 Base) MCG/ACT inhaler, Inhale 2 puffs into the lungs every 6 (six) hours as needed for wheezing or shortness of breath., Disp: 1 Inhaler, Rfl: 2 .  Aspirin-Salicylamide-Caffeine (BC HEADACHE PO), Take by mouth as needed., Disp: , Rfl:  .  calcipotriene (DOVONOX) 0.005 % cream, apply to affected area once daily to twice a day if needed for ps...  (REFER TO PRESCRIPTION NOTES)., Disp: , Rfl: 0 .  cyclobenzaprine (FLEXERIL) 5 MG tablet, Take 1 tablet (5 mg total) by mouth at bedtime., Disp: 30 tablet, Rfl: 0 .  Oxycodone HCl 10 MG TABS, Take 1 tablet (10 mg total) by mouth 3 (three) times daily as needed., Disp: 90 tablet, Rfl: 0 .  polyethylene glycol (MIRALAX / GLYCOLAX) packet, Take 17 g by mouth daily.,  Disp: , Rfl:  .  pravastatin (PRAVACHOL) 40 MG tablet, Take 1 tablet (40 mg total) by mouth at bedtime., Disp: 30 tablet, Rfl: 0 .  ALPRAZolam (XANAX) 0.5 MG tablet, Take 1 tablet (0.5 mg total) by mouth 2 (two) times daily as needed for anxiety. (Patient not taking: Reported on 10/06/2019), Disp: 60 tablet, Rfl: 0 .  Emollient (AQUAPHOR EX), Apply topically., Disp: , Rfl:  .  ranitidine (ZANTAC) 15 MG/ML syrup, Take 10 ml (150 mg) by mouth twice a day (Patient not taking: Reported on 10/06/2019), Disp: 473 mL, Rfl: 1  Allergies  Allergen Reactions  . Codeine Nausea And Vomiting  . Other Other (See Comments)    Powder in surgical gloves   . Tetanus Toxoids Swelling    I personally reviewed active problem list, medication list, allergies, family history, social history, health maintenance, notes from last encounter, lab results with the patient/caregiver today.  Review of Systems  Constitutional: Negative.   HENT: Negative.   Eyes: Negative.   Respiratory: Negative.   Cardiovascular: Negative.   Gastrointestinal: Negative.   Endocrine: Negative.   Genitourinary: Negative.   Musculoskeletal: Negative.   Skin: Negative.   Allergic/Immunologic: Negative.   Neurological: Negative.   Hematological: Negative.   Psychiatric/Behavioral: Negative.   All other systems reviewed and are negative.    Objective:    There were no vitals filed for this visit.  There is no height or weight on file to calculate BMI.  Physical Exam Vitals signs and nursing note reviewed.  Constitutional:      Appearance: She is well-developed.  HENT:     Head: Normocephalic and atraumatic.     Nose: Nose normal.  Eyes:     General:        Right eye: No discharge.        Left eye: No discharge.     Conjunctiva/sclera: Conjunctivae normal.  Neck:     Trachea: No tracheal deviation.  Cardiovascular:     Rate and Rhythm: Normal rate and regular rhythm.  Pulmonary:     Effort: Pulmonary effort is  normal. No respiratory distress.     Breath sounds: No stridor.  Musculoskeletal: Normal range of motion.  Skin:    General: Skin is warm and dry.     Findings: No rash.  Neurological:     Mental Status: She is alert.     Motor: No abnormal muscle tone.     Coordination: Coordination normal.  Psychiatric:        Behavior: Behavior normal.      No results found for this or any previous visit (from the past 2160 hour(s)).   PHQ2/9: Depression screen [facility] 2/9 10/06/2019 12/21/2018 07/16/2018 02/24/2018 01/22/2018  Decreased Interest 0 0 0 0 0  Down, Depressed, Hopeless 0 0 0 0 0  PHQ - 2 Score 0 0 0 0 0  Altered sleeping 0 0 0 - -  Tired, decreased energy 0 0 0 - -  Change in appetite 0 0 0 - -  Feeling bad or failure about yourself  0 0 0 - -  Trouble concentrating 0 0 0 - -  Moving slowly or fidgety/restless 0 0 0 - -  Suicidal thoughts 0 0 0 - -  PHQ-9 Score 0 0 0 - -  Difficult doing work/chores Not difficult at all Not difficult at all Not difficult at all - -  Some recent data might be hidden    phq 9 is negative Reviewed today  Fall Risk: Fall Risk  10/06/2019 12/21/2018 07/16/2018 02/24/2018 01/22/2018  Falls in the past year? 1 0 No Yes No  Comment - - - - -  Number falls in past yr: 0 0 - 1 -  Injury with Fall? 1 0 - No -  Risk for fall due to : - - History of fall(s);Medication side effect - -  Follow up - - - Falls prevention discussed -  Comment - - - - -      Functional Status Survey: Is the patient deaf or have difficulty hearing?: No Does the patient have difficulty seeing, even when wearing glasses/contacts?: No Does the patient have difficulty concentrating, remembering, or making decisions?: No Does the patient have difficulty walking or climbing stairs?: No Does the patient have difficulty dressing or bathing?: No Does the patient have difficulty doing errands alone such as visiting a doctor's office or shopping?: No    Assessment & Plan:        ICD-10-CM   1. Dyslipidemia  99991111 COMPLETE METABOLIC PANEL WITH GFR    Lipid panel    US ARTERIAL ABI (SCREENING LOWER EXTREMITY)   Risk high would like to switch to higher intensity statin, will recalculate risk after labs  2. Current smoker  F17.200 CBC with Differential/Platelet    US ARTERIAL ABI (SCREENING LOWER EXTREMITY)   Discussed smoking cessation, patient not interested in quitting right now  3. Chronic obstructive pulmonary disease, unspecified COPD type (HCC)  J44.9    Uses albuterol inhaler as needed for shortness of breath and chest tightness, no PFTs but suspect she has at least mild COPD  4. Pain in both lower extremities  M79.604 US ARTERIAL ABI (SCREENING LOWER EXTREMITY)   M79.605    LBP worked up by specialist - would like to get ABI with known aortic iliac atherosclerosis  5. H/O unilateral nephrectomy  [id number] COMPLETE METABOLIC  PANEL WITH GFR   monitor renal function  6. Choledochal cyst  Q000111Q COMPLETE METABOLIC PANEL WITH GFR   pt states she has some abnormal liver labs due to problems with her ducts after cholecystectomy -checking labs nothing alarming in recent labs  7. Aorto-iliac atherosclerosis (HCC)  I70.0 US ARTERIAL ABI (SCREENING LOWER EXTREMITY)   I70.8    needs higher intensity statin, recheck labs, with leg pain getting ABI  8. Encounter for medication monitoring  Z51.81 CBC with Differential/Platelet    COMPLETE METABOLIC PANEL WITH GFR    Lipid panel  9. Continuous opioid dependence (HCC) Chronic F11.20    Seeing pain management  10. MDD (major depressive disorder), recurrent episode, moderate (HCC) Chronic F33.1    Currently well controlled  11. Renal cell carcinoma of right kidney [facility]) Chronic C64.1    Resolved with nephrectomy - per oncology monitoring     Return in about 3 months (around 01/06/2020) for Routine follow-up in office to do PE.   Spent roughly 22 min on non-face-to-face virtual encounter and more time spent in reviewing chart,  ordering and arranging care, documentation and follow-up plan.  Delsa Grana, PA-C 10/06/19 1:19 PM

## 2019-11-30 ENCOUNTER — Ambulatory Visit: Payer: Self-pay

## 2019-11-30 NOTE — Telephone Encounter (Signed)
Out going call to Patient .  Patient states that she has frequency and urgency often with moderate pain.  Onset 2 days ago.   Recommended that push drinking a lot of water and recommended adding about 30 ml of cranberry juice every now and then.  Patient voiced understanding.  Keep appointment for tomorrow.  Voiced understanding.         Reason for Disposition . Preventing urinary tract infections (UTIs), questions about  Answer Assessment - Initial Assessment Questions 1. SYMPTOM: "What's the main symptom you're concerned about?" (e.g., frequency, incontinence)frequency pressure 2. ONSET: "When did the  *No Answer*  start?"    2 days ago 3. PAIN: "Is there any pain?" If so, ask: "How bad is it?" (Scale: 1-10; mild, moderate, severe)     Moderate. 4. CAUSE: "What do you think is causing the symptoms?"     Unknown  5. OTHER SYMPTOMS: "Do you have any other symptoms?" (e.g., fever, flank pain, blood in urine, pain with urination)    Pain with urination.  6. PREGNANCY: "Is there any chance you are pregnant?" "When was your last menstrual period?"     Na.  Protocols used: URINARY TRACT INFECTION ON ANTIBIOTIC FOLLOW-UP CALL - FEMALE-A-AH, URINARY [facility]

## 2019-12-01 ENCOUNTER — Other Ambulatory Visit: Payer: Self-pay

## 2019-12-01 ENCOUNTER — Encounter: Payer: Self-pay | Admitting: Family Medicine

## 2019-12-01 ENCOUNTER — Ambulatory Visit (INDEPENDENT_AMBULATORY_CARE_PROVIDER_SITE_OTHER): Payer: Medicare Other | Admitting: Family Medicine

## 2019-12-01 VITALS — BP 122/70 | HR 97 | Temp 97.8°F | Resp 14 | Ht 61.0 in | Wt 120.0 lb

## 2019-12-01 DIAGNOSIS — N3000 Acute cystitis without hematuria: Secondary | ICD-10-CM

## 2019-12-01 LAB — POCT URINALYSIS DIPSTICK
Bilirubin, UA: NEGATIVE
Blood, UA: NEGATIVE
Glucose, UA: NEGATIVE
Ketones, UA: NEGATIVE
Nitrite, UA: NEGATIVE
Odor: NORMAL
Protein, UA: NEGATIVE
Spec Grav, UA: 1.015 (ref 1.010–1.025)
Urobilinogen, UA: 0.2 E.U./dL
pH, UA: 5.5 (ref 5.0–8.0)

## 2019-12-01 MED ORDER — CEPHALEXIN 250 MG/5ML PO SUSR: 500.0000 mg | Freq: Four times a day (QID) | ORAL | 0 refills | Status: AC

## 2019-12-01 NOTE — Patient Instructions (Signed)
Start antibiotics and push a lot of fluids. You can start AZO too  We will call you with the urine culture results.  If negative we may need to check for other reasons that can cause urinary symptoms.  Urinary Frequency, Adult Urinary frequency means urinating more often than usual. You may urinate every 1-2 hours even though you drink a normal amount of fluid and do not have a bladder infection or condition. Although you urinate more often than normal, the total amount of urine produced in a day is normal. With urinary frequency, you may have an urgent need to urinate often. The stress and anxiety of needing to find a bathroom quickly can make this urge worse. This condition may go away on its own or you may need treatment at home. Home treatment may include bladder training, exercises, taking medicines, or making changes to your diet. Follow these instructions at home: Bladder health   Keep a bladder diary if told by your health care provider. Keep track of: ? What you eat and drink. ? How often you urinate. ? How much you urinate.  Follow a bladder training program if told by your health care provider. This may include: ? Learning to delay going to the bathroom. ? Double urinating (voiding). This helps if you are not completely emptying your bladder. ? Scheduled voiding.  Do Kegel exercises as told by your health care provider. Kegel exercises strengthen the muscles that help control urination, which may help the condition. Eating and drinking  If told by your health care provider, make diet changes, such as: ? Avoiding caffeine. ? Drinking fewer fluids, especially alcohol. ? Not drinking in the evening. ? Avoiding foods or drinks that may irritate the bladder. These include coffee, tea, soda, artificial sweeteners, citrus, tomato-based foods, and chocolate. ? Eating foods that help prevent or ease constipation. Constipation can make this condition worse. Your health care provider  may recommend that you:  Drink enough fluid to keep your urine pale yellow.  Take over-the-counter or prescription medicines.  Eat foods that are high in fiber, such as beans, whole grains, and fresh fruits and vegetables.  Limit foods that are high in fat and processed sugars, such as fried or sweet foods. General instructions  Take over-the-counter and prescription medicines only as told by your health care provider.  Keep all follow-up visits as told by your health care provider. This is important. Contact a health care provider if:  You start urinating more often.  You feel pain or irritation when you urinate.  You notice blood in your urine.  Your urine looks cloudy.  You develop a fever.  You begin vomiting. Get help right away if:  You are unable to urinate. Summary  Urinary frequency means urinating more often than usual. With urinary frequency, you may urinate every 1-2 hours even though you drink a normal amount of fluid and do not have a bladder infection or other bladder condition.  Your health care provider may recommend that you keep a bladder diary, follow a bladder training program, or make dietary changes.  If told by your health care provider, do Kegel exercises to strengthen the muscles that help control urination.  Take over-the-counter and prescription medicines only as told by your health care provider.  Contact a health care provider if your symptoms do not improve or get worse. This information is not intended to replace advice given to you by your health care provider. Make sure you discuss any questions you  have with your health care provider. Document Released: 10/12/2009 Document Revised: 06/25/2018 Document Reviewed: 06/25/2018 Elsevier Patient Education  2020 [employer].   Urinary Tract Infection, Adult A urinary tract infection (UTI) is an infection of any part of the urinary tract. The urinary tract includes:  The kidneys.  The  ureters.  The bladder.  The urethra. These organs make, store, and get rid of pee (urine) in the body. What are the causes? This is caused by germs (bacteria) in your genital area. These germs grow and cause swelling (inflammation) of your urinary tract. What increases the risk? You are more likely to develop this condition if:  You have a small, thin tube (catheter) to drain pee.  You cannot control when you pee or poop (incontinence).  You are female, and: ? You use these methods to prevent pregnancy: ? A medicine that kills sperm (spermicide). ? A device that blocks sperm (diaphragm). ? You have low levels of a female hormone (estrogen). ? You are pregnant.  You have genes that add to your risk.  You are sexually active.  You take antibiotic medicines.  You have trouble peeing because of: ? A prostate that is bigger than normal, if you are female. ? A blockage in the part of your body that drains pee from the bladder (urethra). ? A kidney stone. ? A nerve condition that affects your bladder (neurogenic bladder). ? Not getting enough to drink. ? Not peeing often enough.  You have other conditions, such as: ? Diabetes. ? A weak disease-fighting system (immune system). ? Sickle cell disease. ? Gout. ? Injury of the spine. What are the signs or symptoms? Symptoms of this condition include:  Needing to pee right away (urgently).  Peeing often.  Peeing small amounts often.  Pain or burning when peeing.  Blood in the pee.  Pee that smells bad or not like normal.  Trouble peeing.  Pee that is cloudy.  Fluid coming from the vagina, if you are female.  Pain in the belly or lower back. Other symptoms include:  Throwing up (vomiting).  No urge to eat.  Feeling mixed up (confused).  Being tired and grouchy (irritable).  A fever.  Watery poop (diarrhea). How is this treated? This condition may be treated with:  Antibiotic medicine.  Other  medicines.  Drinking enough water. Follow these instructions at home:  Medicines  Take over-the-counter and prescription medicines only as told by your doctor.  If you were prescribed an antibiotic medicine, take it as told by your doctor. Do not stop taking it even if you start to feel better. General instructions  Make sure you: ? Pee until your bladder is empty. ? Do not hold pee for a long time. ? Empty your bladder after sex. ? Wipe from front to back after pooping if you are a female. Use each tissue one time when you wipe.  Drink enough fluid to keep your pee pale yellow.  Keep all follow-up visits as told by your doctor. This is important. Contact a doctor if:  You do not get better after 1-2 days.  Your symptoms go away and then come back. Get help right away if:  You have very bad back pain.  You have very bad pain in your lower belly.  You have a fever.  You are sick to your stomach (nauseous).  You are throwing up. Summary  A urinary tract infection (UTI) is an infection of any part of the urinary tract.  This condition is caused by germs in your genital area.  There are many risk factors for a UTI. These include having a small, thin tube to drain pee and not being able to control when you pee or poop.  Treatment includes antibiotic medicines for germs.  Drink enough fluid to keep your pee pale yellow. This information is not intended to replace advice given to you by your health care provider. Make sure you discuss any questions you have with your health care provider. Document Released: 06/03/2008 Document Revised: 12/03/2018 Document Reviewed: 06/25/2018 Elsevier Patient Education  2020 [employer].
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
## 2019-12-03 LAB — URINE CULTURE
MICRO NUMBER:: 1156493
Result:: NO GROWTH
SPECIMEN QUALITY:: ADEQUATE

## 2019-12-30 ENCOUNTER — Other Ambulatory Visit: Payer: Self-pay | Admitting: Family Medicine

## 2019-12-30 ENCOUNTER — Telehealth: Payer: Self-pay

## 2019-12-30 DIAGNOSIS — D509 Iron deficiency anemia, unspecified: Secondary | ICD-10-CM

## 2019-12-30 NOTE — Telephone Encounter (Signed)
-----   Message from Delsa Grana, [place] sent at 12/30/2019  1:09 PM EST ----- Patient has some anemia I will try and add on labs to get more information about possible iron deficiencies, she will need to come into the office to discuss more and to rule out causes such as heavy periods, GI bleed, do stool cards etc. Her kidney function is wonderful its normal, her liver function is good and all her electrolytes are normal Her cholesterol is much better than her past labs  Can we please follow up on the ABI order for her leg pain?  For now keep all meds the same   Add on iron panel labs and we will need to do stool card.  Have pt hold/avoid any NSAIDs

## 2019-12-30 NOTE — Telephone Encounter (Signed)
Is this what you are wanting?

## 2019-12-31 LAB — CBC WITH DIFFERENTIAL/PLATELET
Absolute Monocytes: 650 cells/uL (ref 200–950)
Basophils Absolute: 101 cells/uL (ref 0–200)
Basophils Relative: 0.9 %
Eosinophils Absolute: 146 cells/uL (ref 15–500)
Eosinophils Relative: 1.3 %
HCT: 34 % — ABNORMAL LOW (ref 35.0–45.0)
Hemoglobin: 10.6 g/dL — ABNORMAL LOW (ref 11.7–15.5)
Lymphs Abs: 3013 cells/uL (ref 850–3900)
MCH: 24.2 pg — ABNORMAL LOW (ref 27.0–33.0)
MCHC: 31.2 g/dL — ABNORMAL LOW (ref 32.0–36.0)
MCV: 77.6 fL — ABNORMAL LOW (ref 80.0–100.0)
MPV: 10.7 fL (ref 7.5–12.5)
Monocytes Relative: 5.8 %
Neutro Abs: 7291 cells/uL (ref 1500–7800)
Neutrophils Relative %: 65.1 %
Platelets: 344 10*3/uL (ref 140–400)
RBC: 4.38 10*6/uL (ref 3.80–5.10)
RDW: 14.8 % (ref 11.0–15.0)
Total Lymphocyte: 26.9 %
WBC: 11.2 10*3/uL — ABNORMAL HIGH (ref 3.8–10.8)

## 2019-12-31 LAB — LIPID PANEL
Cholesterol: 190 mg/dL (ref <200)
HDL: 50 mg/dL (ref >=50)
LDL Cholesterol (Calc): 115 mg/dL (calc) — ABNORMAL HIGH
Non-HDL Cholesterol (Calc): 140 mg/dL (calc) — ABNORMAL HIGH (ref <130)
Total CHOL/HDL Ratio: 3.8 (calc) (ref <5.0)
Triglycerides: 130 mg/dL (ref <150)

## 2019-12-31 LAB — TEST AUTHORIZATION

## 2019-12-31 LAB — COMPLETE METABOLIC PANEL WITH GFR
AG Ratio: 1.3 (calc) (ref 1.0–2.5)
ALT: 5 U/L — ABNORMAL LOW (ref 6–29)
AST: 13 U/L (ref 10–35)
Albumin: 4.2 g/dL (ref 3.6–5.1)
Alkaline phosphatase (APISO): 102 U/L (ref 37–153)
BUN: 9 mg/dL (ref 7–25)
CO2: 24 mmol/L (ref 20–32)
Calcium: 9.3 mg/dL (ref 8.6–10.4)
Chloride: 104 mmol/L (ref 98–110)
Creat: 0.89 mg/dL (ref 0.50–1.05)
GFR, Est African American: 84 mL/min/{1.73_m2} (ref >=60)
GFR, Est Non African American: 72 mL/min/{1.73_m2} (ref >=60)
Globulin: 3.3 g/dL (calc) (ref 1.9–3.7)
Glucose, Bld: 87 mg/dL (ref 65–99)
Potassium: 4.2 mmol/L (ref 3.5–5.3)
Sodium: 138 mmol/L (ref 135–146)
Total Bilirubin: 0.3 mg/dL (ref 0.2–1.2)
Total Protein: 7.5 g/dL (ref 6.1–8.1)

## 2019-12-31 LAB — IRON,TIBC AND FERRITIN PANEL
%SAT: 4 % (calc) — ABNORMAL LOW (ref 16–45)
Ferritin: 7 ng/mL — ABNORMAL LOW (ref 16–232)
Iron: 22 ug/dL — ABNORMAL LOW (ref 45–160)
TIBC: 521 mcg/dL (calc) — ABNORMAL HIGH (ref 250–450)

## 2020-01-07 ENCOUNTER — Encounter: Payer: Medicare Other | Admitting: Family Medicine

## 2020-01-20 ENCOUNTER — Ambulatory Visit (INDEPENDENT_AMBULATORY_CARE_PROVIDER_SITE_OTHER): Payer: Medicare Other | Admitting: Family Medicine

## 2020-01-20 ENCOUNTER — Other Ambulatory Visit: Payer: Self-pay

## 2020-01-20 ENCOUNTER — Encounter: Payer: Self-pay | Admitting: Family Medicine

## 2020-01-20 VITALS — BP 112/68 | HR 103 | Temp 97.1°F | Resp 16 | Ht 61.0 in | Wt 122.6 lb

## 2020-01-20 DIAGNOSIS — I700 Atherosclerosis of aorta: Secondary | ICD-10-CM | POA: Diagnosis not present

## 2020-01-20 DIAGNOSIS — M79605 Pain in left leg: Secondary | ICD-10-CM

## 2020-01-20 DIAGNOSIS — M79604 Pain in right leg: Secondary | ICD-10-CM | POA: Diagnosis not present

## 2020-01-20 DIAGNOSIS — E785 Hyperlipidemia, unspecified: Secondary | ICD-10-CM

## 2020-01-20 DIAGNOSIS — K5903 Drug induced constipation: Secondary | ICD-10-CM

## 2020-01-20 DIAGNOSIS — D509 Iron deficiency anemia, unspecified: Secondary | ICD-10-CM | POA: Diagnosis not present

## 2020-01-20 DIAGNOSIS — J449 Chronic obstructive pulmonary disease, unspecified: Secondary | ICD-10-CM | POA: Diagnosis not present

## 2020-01-20 DIAGNOSIS — Z1231 Encounter for screening mammogram for malignant neoplasm of breast: Secondary | ICD-10-CM

## 2020-01-20 DIAGNOSIS — I708 Atherosclerosis of other arteries: Secondary | ICD-10-CM

## 2020-01-20 MED ORDER — ROSUVASTATIN CALCIUM 10 MG PO TABS: 10.0000 mg | ORAL_TABLET | Freq: Every day | ORAL | 3 refills | Status: DC

## 2020-01-20 MED ORDER — ALBUTEROL SULFATE HFA 108 (90 BASE) MCG/ACT IN AERS: 2.0000 | INHALATION_SPRAY | Freq: Four times a day (QID) | RESPIRATORY_TRACT | 3 refills | Status: DC | PRN

## 2020-01-20 MED ORDER — DOCUSATE SODIUM 100 MG PO CAPS: 100.0000 mg | ORAL_CAPSULE | ORAL | 2 refills | Status: DC

## 2020-01-20 NOTE — Progress Notes (Signed)
Name: KJIRSTEN JOFFE   MRN: [id number]    DOB: June 30, 1961   Date:01/20/2020       Progress Note  Chief Complaint  Patient presents with  . Follow-up  . Hyperlipidemia     Subjective:   CUTINA DEAVILA is a 59 y.o. female, presents to clinic for routine follow up on the conditions listed above.  F/up on microcytic anemia  Stool not changed, stays constipated due to narcotic pain meds, managed with miralax, some intermittent bloating and cramping. Denies melena, hematochezia, change to stool caliber Sometimes she has 1 bm a week or up to 3 or 4 Some lower abd cramping feels similar to menstrual cramps No urinary sx Partial hysterectomy "years ago" 15 years ago- no vaginal bleeding/blood loss  Has not gotten ordered ABI for leg pain done yet and has not heard anything - phone number for f/up- [phone] for scheduling     Patient Active Problem List   Diagnosis Date Noted  . MDD (major depressive disorder), recurrent episode, moderate ([place]) 10/06/2019  . Renal cell carcinoma of right kidney ([place]) 10/06/2019  . Chronic obstructive pulmonary disease ([place]) 10/06/2019  . Choledochal cyst 09/29/2018  . Tobacco abuse 07/22/2018  . Alkaline phosphatase elevation 07/18/2018  . Aorto-iliac atherosclerosis ([place]) 07/16/2018  . RBBB 09/16/2017  . Muscle spasms of neck 05/07/2017  . Common bile duct dilation 02/26/2017  . Well woman exam without gynecological exam 02/03/2017  . Hot flashes due to menopause 01/13/2017  . Acute pelvic pain 01/13/2017  . Chronic upper back pain 06/19/2016  . Chronic low back pain 06/19/2016  . Right hip pain 06/19/2016  . Anxiety disorder, unspecified 04/18/2016  . Post-traumatic stress reaction 03/29/2016  . Chronic, continuous use of opioids 03/14/2016  . Constipation 02/29/2016  . Pain of right eyelid 11/10/2015  . Facial burning 11/03/2015  . Chronic neck pain 08/01/2015  . Chronic neck and back pain 06/06/2015  . Anxiety 06/02/2015  . Leg  pain 06/02/2015  . Nerve root pain 06/02/2015  . Chronic LBP 06/02/2015  . Chronic pain associated with significant psychosocial dysfunction 06/02/2015  . Pain in shoulder 06/02/2015  . Continuous opioid dependence ([place]) 06/02/2015  . Dyslipidemia 06/02/2015  . Psoriasis 06/02/2015  . Scoliosis (and kyphoscoliosis), idiopathic 06/02/2015  . Counseling on substance use and abuse 06/02/2015  . B12 deficiency 06/02/2015  . Vitamin D deficiency 06/02/2015  . Atypical chest pain 03/03/2015    Past Surgical History:  Procedure Laterality Date  . ABDOMINAL HYSTERECTOMY    . APPENDECTOMY    . BREAST LUMPECTOMY    . CARPAL TUNNEL RELEASE    . CHOLECYSTECTOMY    . NEPHRECTOMY Right    09/2017 @ [facility]    Family History  Problem Relation Age of Onset  . Cancer Mother   . Diabetes Mother   . Thyroid disease Mother   . Heart disease Father   . Alcohol abuse Father   . Cancer Father   . Thyroid disease Sister   . Heart disease Sister   . Drug abuse Sister   . Paranoid behavior Sister   . Diabetes Sister   . Hyperlipidemia Sister   . Prostate cancer Neg Hx   . Kidney cancer Neg Hx     Social History   Socioeconomic History  . Marital status: Divorced    Spouse name: Not on file  . Number of children: 4  . Years of education: Not on file  . Highest education level: 8th grade  Occupational History  . Occupation: Disabled  Tobacco Use  . Smoking status: Current Every Day Smoker    Packs/day: 1.50    Years: 41.00    Pack years: 61.50    Types: Cigarettes  . Smokeless tobacco: Never Used  Substance and Sexual Activity  . Alcohol use: No  . Drug use: No  . Sexual activity: Yes  Other Topics Concern  . Not on file  Social History Narrative  . Not on file   Social Determinants of Health   Financial Resource Strain:   . Difficulty of Paying Living Expenses: Not on file  Food Insecurity:   . Worried About Charity fundraiser in the Last Year: Not on file  . Ran Out of  Food in the Last Year: Not on file  Transportation Needs:   . Lack of Transportation (Medical): Not on file  . Lack of Transportation (Non-Medical): Not on file  Physical Activity:   . Days of Exercise per Week: Not on file  . Minutes of Exercise per Session: Not on file  Stress:   . Feeling of Stress : Not on file  Social Connections:   . Frequency of Communication with Friends and Family: Not on file  . Frequency of Social Gatherings with Friends and Family: Not on file  . Attends Religious Services: Not on file  . Active Member of Clubs or Organizations: Not on file  . Attends Archivist Meetings: Not on file  . Marital Status: Not on file  Intimate Partner Violence:   . Fear of Current or Ex-Partner: Not on file  . Emotionally Abused: Not on file  . Physically Abused: Not on file  . Sexually Abused: Not on file     Current Outpatient Medications:  .  acetaminophen (TYLENOL) 160 MG/5ML liquid, Take 160 mg by mouth every 6 (six) hours as needed for fever., Disp: , Rfl:  .  albuterol (PROVENTIL HFA;VENTOLIN HFA) 108 (90 Base) MCG/ACT inhaler, Inhale 2 puffs into the lungs every 6 (six) hours as needed for wheezing or shortness of breath., Disp: 1 Inhaler, Rfl: 2 .  cyclobenzaprine (FLEXERIL) 5 MG tablet, Take 1 tablet (5 mg total) by mouth at bedtime., Disp: 30 tablet, Rfl: 0 .  Emollient (AQUAPHOR EX), Apply topically., Disp: , Rfl:  .  Oxycodone HCl 10 MG TABS, Take 1 tablet (10 mg total) by mouth 3 (three) times daily as needed., Disp: 90 tablet, Rfl: 0 .  polyethylene glycol (MIRALAX / GLYCOLAX) packet, Take 17 g by mouth daily., Disp: , Rfl:  .  pravastatin (PRAVACHOL) 40 MG tablet, Take 1 tablet (40 mg total) by mouth at bedtime., Disp: 30 tablet, Rfl: 0  Allergies  Allergen Reactions  . Codeine Nausea And Vomiting  . Other Other (See Comments)    Powder in surgical gloves   . Tetanus Toxoids Swelling    Chart Review Today: I personally reviewed active  problem list, medication list, allergies, family history, social history, health maintenance, notes from last encounter, lab results, imaging with the patient/caregiver today.   Review of Systems  Constitutional: Negative.   HENT: Negative.   Eyes: Negative.   Respiratory: Negative.   Cardiovascular: Negative.   Gastrointestinal: Negative.   Endocrine: Negative.   Genitourinary: Negative.   Musculoskeletal: Negative.   Skin: Negative.   Allergic/Immunologic: Negative.   Neurological: Negative.   Hematological: Negative.   Psychiatric/Behavioral: Negative.   All other systems reviewed and are negative.    Objective:  Vitals:   01/20/20 0933  BP: 112/68  Pulse: (!) 103  Resp: 16  Temp: (!) 97.1 F (36.2 C)  TempSrc: Temporal  SpO2: 95%  Weight: 122 lb 9.6 oz (55.6 kg)  Height: 5\' 1"  (1.549 m)    Body mass index is 23.17 kg/m.  Physical Exam Vitals and nursing note reviewed.  Constitutional:      General: She is not in acute distress.    Appearance: Normal appearance. She is well-developed. She is not ill-appearing, toxic-appearing or diaphoretic.     Interventions: Face mask in place.  HENT:     Head: Normocephalic and atraumatic.     Right Ear: External ear normal.     Left Ear: External ear normal.  Eyes:     General: Lids are normal. No scleral icterus.       Right eye: No discharge.        Left eye: No discharge.     Conjunctiva/sclera: Conjunctivae normal.     Pupils: Pupils are equal, round, and reactive to light.     Comments: No conjunctival pallor  Neck:     Trachea: Phonation normal. No tracheal deviation.  Cardiovascular:     Rate and Rhythm: Normal rate and regular rhythm.     Pulses: Normal pulses.          Radial pulses are 2+ on the right side and 2+ on the left side.       Posterior tibial pulses are 2+ on the right side and 2+ on the left side.     Heart sounds: Normal heart sounds. No murmur. No friction rub. No gallop.   Pulmonary:       Effort: Pulmonary effort is normal. No respiratory distress.     Breath sounds: Normal breath sounds. No stridor. No wheezing, rhonchi or rales.  Chest:     Chest wall: No tenderness.  Abdominal:     General: Bowel sounds are normal. There is no distension.     Palpations: Abdomen is soft.     Tenderness: There is no abdominal tenderness. There is no guarding or rebound.  Musculoskeletal:        General: No deformity. Normal range of motion.     Cervical back: Normal range of motion and neck supple.     Right lower leg: No edema.     Left lower leg: No edema.  Lymphadenopathy:     Cervical: No cervical adenopathy.  Skin:    General: Skin is warm and dry.     Capillary Refill: Capillary refill takes less than 2 seconds.     Coloration: Skin is not jaundiced or pale.     Findings: No rash.  Neurological:     Mental Status: She is alert and oriented to person, place, and time.     Motor: No abnormal muscle tone.     Gait: Gait normal.  Psychiatric:        Speech: Speech normal.        Behavior: Behavior normal.     PHQ2/9: Depression screen [facility] 2/9 01/20/2020 12/01/2019 10/06/2019 12/21/2018 07/16/2018  Decreased Interest 0 0 0 0 0  Down, Depressed, Hopeless 0 0 0 0 0  PHQ - 2 Score 0 0 0 0 0  Altered sleeping 1 0 0 0 0  Tired, decreased energy 1 0 0 0 0  Change in appetite 0 0 0 0 0  Feeling bad or failure about yourself  0 0 0 0 0  Trouble  concentrating 0 0 0 0 0  Moving slowly or fidgety/restless 0 0 0 0 0  Suicidal thoughts 0 0 0 0 0  PHQ-9 Score 2 0 0 0 0  Difficult doing work/chores Not difficult at all Not difficult at all Not difficult at all Not difficult at all Not difficult at all  Some recent data might be hidden    phq 9 is neg, score less than 4, reviewed today  Fall Risk: Fall Risk  01/20/2020 12/01/2019 10/06/2019 12/21/2018 07/16/2018  Falls in the past year? 0 0 1 0 No  Comment - - - - -  Number falls in past yr: 0 0 0 0 -  Injury with Fall? 0 0 1 0 -   Risk for fall due to : - - - - History of fall(s);Medication side effect  Follow up - - - - -  Comment - - - - -    Functional Status Survey: Is the patient deaf or have difficulty hearing?: No Does the patient have difficulty seeing, even when wearing glasses/contacts?: No Does the patient have difficulty concentrating, remembering, or making decisions?: No Does the patient have difficulty walking or climbing stairs?: No Does the patient have difficulty dressing or bathing?: No Does the patient have difficulty doing errands alone such as visiting a doctor's office or shopping?: No   Assessment & Plan:     ICD-10-CM   1. Microcytic anemia  D50.9 CBC (INCLUDES DIFF/PLT) WITH PATHOLOGIST REVIEW   recheck H/H, has been decreasing gradually, r/o GI blood loss, take home stool cards give to pt with instructions, check iron panel  2. Pain in both lower extremities  M79.604 US ARTERIAL ABI (SCREENING LOWER EXTREMITY)   M79.605    abi's not yet done, follow up to r/o PAD  3. Chronic obstructive pulmonary disease, unspecified COPD type ([place])  J44.9    inhaler refills  4. Aorto-iliac atherosclerosis ([facility])  I70.0 albuterol (VENTOLIN HFA) 108 (90 Base) MCG/ACT inhaler   I70.8 US ARTERIAL ABI (SCREENING LOWER EXTREMITY)   on statin, monitoring, working on ABI screening to assess PAD  5. Hyperlipidemia, unspecified hyperlipidemia type  E78.5 rosuvastatin (CRESTOR) 10 MG tablet    US ARTERIAL ABI (SCREENING LOWER EXTREMITY)   med change, d/c pravastain and start crestor statin, labs recently checked, poor control, change in tx, will recheck in 3 months  6. Drug-induced constipation  K59.03 docusate sodium (COLACE) 100 MG capsule   encouraged her to use stool softeners with increased frequency or dose since BM's sometimes up to a week inbetween  7. Encounter for screening mammogram for malignant neoplasm of breast  Z12.31 MM 3D SCREEN BREAST BILATERAL     Return for 3 month f/up.   Delsa Grana, PA-C 01/20/20 9:53 AM

## 2020-01-20 NOTE — Patient Instructions (Signed)
Bring back the stool cards please  Stop pravastatin and start crestor  You should be called to schedule tests for your legs  Start taking a colace in the morning with your pain meds, and miralax another time of day - we want your stomach feeling good so we can start iron supplements and they are hard on the stomach and can cause constipation.

## 2020-01-21 ENCOUNTER — Other Ambulatory Visit: Payer: Self-pay

## 2020-01-21 LAB — CBC (INCLUDES DIFF/PLT) WITH PATHOLOGIST REVIEW
Absolute Monocytes: 587 cells/uL (ref 200–950)
Basophils Absolute: 62 cells/uL (ref 0–200)
Basophils Relative: 0.9 %
Eosinophils Absolute: 138 cells/uL (ref 15–500)
Eosinophils Relative: 2 %
HCT: 33.9 % — ABNORMAL LOW (ref 35.0–45.0)
Hemoglobin: 10.7 g/dL — ABNORMAL LOW (ref 11.7–15.5)
Lymphs Abs: 2049 cells/uL (ref 850–3900)
MCH: 24.4 pg — ABNORMAL LOW (ref 27.0–33.0)
MCHC: 31.6 g/dL — ABNORMAL LOW (ref 32.0–36.0)
MCV: 77.2 fL — ABNORMAL LOW (ref 80.0–100.0)
MPV: 10.6 fL (ref 7.5–12.5)
Monocytes Relative: 8.5 %
Neutro Abs: 4064 cells/uL (ref 1500–7800)
Neutrophils Relative %: 58.9 %
Platelets: 325 10*3/uL (ref 140–400)
RBC: 4.39 10*6/uL (ref 3.80–5.10)
RDW: 14.7 % (ref 11.0–15.0)
Total Lymphocyte: 29.7 %
WBC: 6.9 10*3/uL (ref 3.8–10.8)

## 2020-01-26 ENCOUNTER — Ambulatory Visit: Payer: Medicare Other | Attending: Family Medicine

## 2020-01-26 ENCOUNTER — Telehealth: Payer: Self-pay

## 2020-01-26 NOTE — Telephone Encounter (Signed)
Copied from [place] [phone]. Topic: General - Other >> Jan 26, 2020  4:14 PM Yvette Rack wrote: Reason for CRM: Kim with [facility] stated pt did not show up for scheduled appt.

## 2020-04-18 ENCOUNTER — Ambulatory Visit: Payer: Medicare Other

## 2020-06-13 NOTE — Progress Notes (Signed)
Knee pain      Patient ID: Annette Arnold, female    DOB: 1961/07/23, 59 y.o.   MRN: [id number]  PCP: Delsa Grana, PA-C  Chief Complaint  Patient presents with  . Leg Swelling    Onset over a month ago, right leg, lower left under her knee swelling    Subjective:   Annette Arnold is a 59 y.o. female, presents to clinic with CC of the following:  Chief Complaint  Patient presents with  . Leg Swelling    Onset over a month ago, right leg, lower left under her knee swelling    HPI:  Patient is a 59 year old female patient of Annette Arnold. Last seen by Annette Arnold in January 2021. On that visit, the following was noted: Has not gotten ordered ABI for leg pain done yet and has not heard anything - phone number for f/up- [phone] for scheduling (have been ordered in October 2020 prior to that January 2021 visit) She follows up today with right knee swelling greater than pain, does have occasional pain. Of note, she does take oxycodone regularly for management of chronic pain.  Has been bothersome for past month, notices some mild swelling more on the inside of the knee No one time trauma preceded the onset When she does have pain, she feels it more on the inside, sometimes shooting up towards the thigh. Annette Arnold makes a noise noted No major swelling of the knee noted She notes it has not been limiting for her, Did not try ice at all Not locking or giving out  No h/o major knee injury, has seen at [facility] previously for knee discomfort.  Tobacco-long-term every day smoker  Patient Active Problem List   Diagnosis Date Noted  . MDD (major depressive disorder), recurrent episode, moderate ([place]) 10/06/2019  . Renal cell carcinoma of right kidney ([place]) 10/06/2019  . Chronic obstructive pulmonary disease ([place]) 10/06/2019  . Choledochal cyst 09/29/2018  . Tobacco abuse 07/22/2018  . Alkaline phosphatase elevation 07/18/2018  . Aorto-iliac atherosclerosis ([place]) 07/16/2018  .  RBBB 09/16/2017  . Muscle spasms of neck 05/07/2017  . Common bile duct dilation 02/26/2017  . Well woman exam without gynecological exam 02/03/2017  . Hot flashes due to menopause 01/13/2017  . Acute pelvic pain 01/13/2017  . Chronic upper back pain 06/19/2016  . Chronic low back pain 06/19/2016  . Right hip pain 06/19/2016  . Anxiety disorder, unspecified 04/18/2016  . Post-traumatic stress reaction 03/29/2016  . Chronic, continuous use of opioids 03/14/2016  . Constipation 02/29/2016  . Pain of right eyelid 11/10/2015  . Facial burning 11/03/2015  . Chronic neck pain 08/01/2015  . Chronic neck and back pain 06/06/2015  . Anxiety 06/02/2015  . Leg pain 06/02/2015  . Nerve root pain 06/02/2015  . Chronic LBP 06/02/2015  . Chronic pain associated with significant psychosocial dysfunction 06/02/2015  . Pain in shoulder 06/02/2015  . Continuous opioid dependence ([place]) 06/02/2015  . Hyperlipidemia 06/02/2015  . Psoriasis 06/02/2015  . Scoliosis (and kyphoscoliosis), idiopathic 06/02/2015  . Counseling on substance use and abuse 06/02/2015  . B12 deficiency 06/02/2015  . Vitamin D deficiency 06/02/2015  . Atypical chest pain 03/03/2015      Current Outpatient Medications:  .  albuterol (VENTOLIN HFA) 108 (90 Base) MCG/ACT inhaler, Inhale 2 puffs into the lungs every 6 (six) hours as needed for wheezing or shortness of breath., Disp: 18 g, Rfl: 3 .  cyclobenzaprine (FLEXERIL) 5 MG tablet, Take 1 tablet (5  mg total) by mouth at bedtime., Disp: 30 tablet, Rfl: 0 .  docusate sodium (COLACE) 100 MG capsule, Take 1 capsule (100 mg total) by mouth every morning., Disp: 90 capsule, Rfl: 2 .  Emollient (AQUAPHOR EX), Apply topically., Disp: , Rfl:  .  Oxycodone HCl 10 MG TABS, Take 1 tablet (10 mg total) by mouth 3 (three) times daily as needed., Disp: 90 tablet, Rfl: 0 .  polyethylene glycol (MIRALAX / GLYCOLAX) packet, Take 17 g by mouth daily., Disp: , Rfl:  .  rosuvastatin (CRESTOR)  10 MG tablet, Take 1 tablet (10 mg total) by mouth daily., Disp: 90 tablet, Rfl: 3   Allergies  Allergen Reactions  . Codeine Nausea And Vomiting  . Other Other (See Comments)    Powder in surgical gloves   . Tetanus Toxoids Swelling     Past Surgical History:  Procedure Laterality Date  . ABDOMINAL HYSTERECTOMY    . APPENDECTOMY    . BREAST LUMPECTOMY    . CARPAL TUNNEL RELEASE    . CHOLECYSTECTOMY    . NEPHRECTOMY Right    09/2017 @ [facility]     Family History  Problem Relation Age of Onset  . Cancer Mother   . Diabetes Mother   . Thyroid disease Mother   . Heart disease Father   . Alcohol abuse Father   . Cancer Father   . Thyroid disease Sister   . Heart disease Sister   . Drug abuse Sister   . Paranoid behavior Sister   . Diabetes Sister   . Hyperlipidemia Sister   . Prostate cancer Neg Hx   . Kidney cancer Neg Hx      Social History   Tobacco Use  . Smoking status: Current Every Day Smoker    Packs/day: 1.00    Years: 41.00    Pack years: 41.00    Types: Cigarettes  . Smokeless tobacco: Never Used  Substance Use Topics  . Alcohol use: No    With staff assistance, above reviewed with the patient today.  ROS: As per HPI, otherwise no specific complaints on a limited and focused system review   No results found for this or any previous visit (from the past 72 hour(s)).   PHQ2/9: Depression screen [facility] 2/9 06/14/2020 01/20/2020 12/01/2019 10/06/2019 12/21/2018  Decreased Interest 0 0 0 0 0  Down, Depressed, Hopeless 0 0 0 0 0  PHQ - 2 Score 0 0 0 0 0  Altered sleeping 0 1 0 0 0  Tired, decreased energy 0 1 0 0 0  Change in appetite 0 0 0 0 0  Feeling bad or failure about yourself  0 0 0 0 0  Trouble concentrating 0 0 0 0 0  Moving slowly or fidgety/restless 0 0 0 0 0  Suicidal thoughts 0 0 0 0 0  PHQ-9 Score 0 2 0 0 0  Difficult doing work/chores Not difficult at all Not difficult at all Not difficult at all Not difficult at all Not difficult at  all  Some recent data might be hidden   PHQ-2/9 Result is   Fall Risk: Fall Risk  06/14/2020 01/20/2020 12/01/2019 10/06/2019 12/21/2018  Falls in the past year? 0 0 0 1 0  Comment - - - - -  Number falls in past yr: 0 0 0 0 0  Injury with Fall? 0 0 0 1 0  Risk for fall due to : - - - - -  Follow up - - - - -  Comment - - - - -      Objective:   Vitals:   06/14/20 1028  BP: 102/70  Pulse: 78  Resp: 16  Temp: 98.1 F (36.7 C)  TempSrc: Temporal  SpO2: 99%  Weight: 123 lb 6.4 oz (56 kg)  Height: 5\' 1"  (1.549 m)    Body mass index is 23.32 kg/m.  Physical Exam   NAD, masked HEENT - [place]/AT, sclera anicteric,  Ext - no LE edema, no right calf tenderness to palpation, no calf swelling, no increased erythema, no increased warmth, no cords present Left knee-good range of motion, nontender with movement of the patella, Lachman negative, no effusion Right knee - Good ROM, no limitations or pain with range of motion testing,   Marked crepitus on ROM testing noted  No effusion, question some mild swelling more in the suprapatellar bursa region and not tender to palpate this area today (occasionally gets some discomfort in this region)  No bruising  NT with movement of the patella, no gross deformity of patella  No medial or lateral joint line tenderness with palpation (notes some soreness at times in that medial joint line area)  Med and lat collaterals intact on testing with no gap, no pain with testing  Lachman neg  And and post drawer neg  McMurrays neg  No pain posterior, no obvious Baker's cyst Neuro/psychiatric - affect was not flat, appropriate with conversation  Alert, speech normal  Results for orders placed or performed in visit on 01/20/20  CBC (INCLUDES DIFF/PLT) WITH PATHOLOGIST REVIEW  Result Value Ref Range   WBC 6.9 3.8 - 10.8 Thousand/uL   RBC 4.39 3.80 - 5.10 Million/uL   Hemoglobin 10.7 (L) 11.7 - 15.5 g/dL   HCT 33.9 (L) 35 - 45 %   MCV 77.2 (L) 80.0 -  100.0 fL   MCH 24.4 (L) 27.0 - 33.0 pg   MCHC 31.6 (L) 32.0 - 36.0 g/dL   RDW 14.7 11.0 - 15.0 %   Platelets 325 140 - 400 Thousand/uL   MPV 10.6 7.5 - 12.5 fL   Neutro Abs 4,064 1,500 - 7,800 cells/uL   Lymphs Abs 2,049 850 - 3,900 cells/uL   Absolute Monocytes 587 200 - 950 cells/uL   Eosinophils Absolute 138 15 - 500 cells/uL   Basophils Absolute 62 0 - 200 cells/uL   Neutrophils Relative % 58.9 %   Total Lymphocyte 29.7 %   Monocytes Relative 8.5 %   Eosinophils Relative 2.0 %   Basophils Relative 0.9 %   Comment         Assessment & Plan:   1. Acute pain of right knee Patient actually noted more of a swelling concerned than the pain concern, with some mild discomfort at times noted 2. Patellofemoral dysfunction of right knee Noted a marked crepitus on exam and likely patellofemoral dysfunction concerns.  Educated patient on this today.  Knee stable on exam today, also feel meniscus without a big tear concern.  Do feel patellofemoral dysfunction a likely contributor to her symptoms today.  Rec'ed liberal use of ice short term as needed, and continue as needed over time as noted the chronicity with patellofemoral dysfunction.  Can use a sleeve for support short term when out and about  She does not do impact exercise, just walking and can continue as tolerated.    Also can elevate recommended short-term  Reviewed the PFS exercises to do and begin as improving pending x-ray review (wall squats, towel between the  knees, and terminal extensions) 3. Bursitis of right knee, unspecified bursa Do feel she has an element of bursitis contributing as well. The above conservative measures will be helpful, with ice and elevation especially important in the short-term  Reassured there are no concerns for a blood clot, also that the knee is very stable. Should follow-up if symptoms not improving or more problematic over time.  May need imaging or PT over time pending her  response.       Towanda Malkin, MD 06/14/20 10:51 AM

## 2020-06-14 ENCOUNTER — Encounter: Payer: Self-pay | Admitting: Internal Medicine

## 2020-06-14 ENCOUNTER — Other Ambulatory Visit: Payer: Self-pay

## 2020-06-14 ENCOUNTER — Ambulatory Visit (INDEPENDENT_AMBULATORY_CARE_PROVIDER_SITE_OTHER): Payer: Medicare Other | Admitting: Internal Medicine

## 2020-06-14 VITALS — BP 102/70 | HR 78 | Temp 98.1°F | Resp 16 | Ht 61.0 in | Wt 123.4 lb

## 2020-06-14 DIAGNOSIS — M25561 Pain in right knee: Secondary | ICD-10-CM | POA: Diagnosis not present

## 2020-06-14 DIAGNOSIS — M25861 Other specified joint disorders, right knee: Secondary | ICD-10-CM

## 2020-06-14 DIAGNOSIS — M7051 Other bursitis of knee, right knee: Secondary | ICD-10-CM | POA: Diagnosis not present

## 2020-06-14 NOTE — Patient Instructions (Signed)

## 2020-06-15 ENCOUNTER — Ambulatory Visit (INDEPENDENT_AMBULATORY_CARE_PROVIDER_SITE_OTHER): Payer: Medicare Other

## 2020-06-15 DIAGNOSIS — Z0000 Encounter for general adult medical examination without abnormal findings: Secondary | ICD-10-CM

## 2020-06-15 NOTE — Progress Notes (Signed)
Subjective:   Annette Arnold is a 59 y.o. female who presents for Medicare Annual (Subsequent) preventive examination.  Virtual Visit via Telephone Note  I connected with  Annette Arnold on 06/15/20 at  2:10 PM EDT by telephone and verified that I am speaking with the correct person using two identifiers.  Medicare Annual Wellness visit completed telephonically due to Covid-19 pandemic.   Location: Patient: home Provider: office   I discussed the limitations, risks, security and privacy concerns of performing an evaluation and management service by telephone and the availability of in person appointments. The patient expressed understanding and agreed to proceed.  Unable to perform video visit due to video visit attempted and failed and/or patient does not have video capability.   Some vital signs may be absent or patient reported.   Clemetine Marker, LPN    Review of Systems:   Cardiac Risk Factors include: advanced age (>36men, >36 women);dyslipidemia;sedentary lifestyle;smoking/ tobacco exposure     Objective:     Vitals: There were no vitals taken for this visit.  There is no height or weight on file to calculate BMI.  Advanced Directives 06/15/2020 11/06/2018 07/16/2018 02/24/2018 10/24/2017 08/28/2017 07/30/2017  Does Patient Have a Medical Advance Directive? No No No No No No No  Would patient like information on creating a medical advance directive? Yes (MAU/Ambulatory/Procedural Areas - Information given) No - Patient declined Yes (MAU/Ambulatory/Procedural Areas - Information given) Yes (MAU/Ambulatory/Procedural Areas - Information given) - - -  Some encounter information is confidential and restricted. Go to Review Flowsheets activity to see all data.    Tobacco Social History   Tobacco Use  Smoking Status Current Every Day Smoker  . Packs/day: 1.00  . Years: 41.00  . Pack years: 41.00  . Types: Cigarettes  Smokeless Tobacco Never Used     Ready to quit:  No Counseling given: Not Answered   Clinical Intake:  Pre-visit preparation completed: Yes  Pain : No/denies pain     Nutritional Status: BMI of 19-24  Normal Nutritional Risks: None Diabetes: No  How often do you need to have someone help you when you read instructions, pamphlets, or other written materials from your doctor or pharmacy?: 1 - Never  Interpreter Needed?: No  Information entered by :: Clemetine Marker LPN  Past Medical History:  Diagnosis Date  . Anxiety   . Aorto-iliac atherosclerosis ([place]) 07/16/2018   Xray 2016  . Chronic pain   . Hyperlipidemia    Past Surgical History:  Procedure Laterality Date  . ABDOMINAL HYSTERECTOMY    . APPENDECTOMY    . BREAST LUMPECTOMY    . CARPAL TUNNEL RELEASE    . CHOLECYSTECTOMY    . NEPHRECTOMY Right    09/2017 @ [facility]   Family History  Problem Relation Age of Onset  . Cancer Mother   . Diabetes Mother   . Thyroid disease Mother   . Heart disease Father   . Alcohol abuse Father   . Cancer Father   . Thyroid disease Sister   . Heart disease Sister   . Drug abuse Sister   . Paranoid behavior Sister   . Diabetes Sister   . Hyperlipidemia Sister   . Prostate cancer Neg Hx   . Kidney cancer Neg Hx    Social History   Socioeconomic History  . Marital status: Divorced    Spouse name: Not on file  . Number of children: 4  . Years of education: Not on file  .  Highest education level: 8th grade  Occupational History  . Occupation: Disabled  Tobacco Use  . Smoking status: Current Every Day Smoker    Packs/day: 1.00    Years: 41.00    Pack years: 41.00    Types: Cigarettes  . Smokeless tobacco: Never Used  Vaping Use  . Vaping Use: Never used  Substance and Sexual Activity  . Alcohol use: No  . Drug use: No  . Sexual activity: Yes  Other Topics Concern  . Not on file  Social History Narrative   Lives with her son and his family   Social Determinants of Health   Financial Resource Strain: Low Risk    . Difficulty of Paying Living Expenses: Not hard at all  Food Insecurity: No Food Insecurity  . Worried About Charity fundraiser in the Last Year: Never true  . Ran Out of Food in the Last Year: Never true  Transportation Needs: No Transportation Needs  . Lack of Transportation (Medical): No  . Lack of Transportation (Non-Medical): No  Physical Activity: Inactive  . Days of Exercise per Week: 0 days  . Minutes of Exercise per Session: 0 min  Stress: Stress Concern Present  . Feeling of Stress : To some extent  Social Connections: Socially Isolated  . Frequency of Communication with Friends and Family: More than three times a week  . Frequency of Social Gatherings with Friends and Family: More than three times a week  . Attends Religious Services: Never  . Active Member of Clubs or Organizations: No  . Attends Archivist Meetings: Never  . Marital Status: Divorced    Outpatient Encounter Medications as of 06/15/2020  Medication Sig  . albuterol (VENTOLIN HFA) 108 (90 Base) MCG/ACT inhaler Inhale 2 puffs into the lungs every 6 (six) hours as needed for wheezing or shortness of breath.  . cyclobenzaprine (FLEXERIL) 10 MG tablet Take 10 mg by mouth 2 (two) times daily as needed.  . Oxycodone HCl 10 MG TABS Take 1 tablet (10 mg total) by mouth 3 (three) times daily as needed.  . polyethylene glycol (MIRALAX / GLYCOLAX) packet Take 17 g by mouth daily.  . rosuvastatin (CRESTOR) 10 MG tablet Take 1 tablet (10 mg total) by mouth daily.  Marland Kitchen senna-docusate (SENOKOT-S) 8.6-50 MG tablet Take 2 tablets by mouth daily.  . [DISCONTINUED] docusate sodium (COLACE) 100 MG capsule Take 1 capsule (100 mg total) by mouth every morning.  . [DISCONTINUED] Emollient (AQUAPHOR EX) Apply topically.  . [DISCONTINUED] cyclobenzaprine (FLEXERIL) 5 MG tablet Take 1 tablet (5 mg total) by mouth at bedtime.   No facility-administered encounter medications on file as of 06/15/2020.    Activities of  Daily Living In your present state of health, do you have any difficulty performing the following activities: 06/15/2020 06/14/2020  Hearing? N N  Comment declines hearing aids -  Vision? N N  Difficulty concentrating or making decisions? N N  Walking or climbing stairs? N N  Dressing or bathing? N N  Doing errands, shopping? N N  Preparing Food and eating ? N -  Using the Toilet? N -  In the past six months, have you accidently leaked urine? N -  Do you have problems with loss of bowel control? N -  Managing your Medications? N -  Managing your Finances? N -  Housekeeping or managing your Housekeeping? N -  Some recent data might be hidden    Patient Care Team: Laurell Roof as PCP -  General (Family Medicine) Bowen, Jeannie Fend, PA-C as Consulting Physician    Assessment:   This is a routine wellness examination for [place].  Exercise Activities and Dietary recommendations Current Exercise Habits: The patient does not participate in regular exercise at present, Exercise limited by: orthopedic condition(s)  Goals    . DIET - INCREASE WATER INTAKE     Recommend to drink at least 6-8 8oz glasses of water per day.       Fall Risk Fall Risk  06/15/2020 06/14/2020 01/20/2020 12/01/2019 10/06/2019  Falls in the past year? 0 0 0 0 1  Comment - - - - -  Number falls in past yr: 0 0 0 0 0  Injury with Fall? 0 0 0 0 1  Risk for fall due to : No Fall Risks - - - -  Follow up Falls prevention discussed - - - -  Comment - - - - -   FALL RISK PREVENTION PERTAINING TO THE HOME:  Any stairs in or around the home? Yes  If so, are there any without handrails? No  2 steps outside  Home free of loose throw rugs in walkways, pet beds, electrical cords, etc? Yes  Adequate lighting in your home to reduce risk of falls? Yes   ASSISTIVE DEVICES UTILIZED TO PREVENT FALLS:  Life alert? No  Use of a cane, walker or w/c? No  Grab bars in the bathroom? No  Shower chair or bench in  shower? No  Elevated toilet seat or a handicapped toilet? No   DME ORDERS:  DME order needed?  No   TIMED UP AND GO:  Was the test performed? No . Telephonic visit.   Education: Fall risk prevention has been discussed.  Intervention(s) required? No     Depression Screen PHQ 2/9 Scores 06/15/2020 06/14/2020 01/20/2020 12/01/2019  PHQ - 2 Score 0 0 0 0  PHQ- 9 Score - 0 2 0     Cognitive Function - pt declined 6CIT for 2021 AWV; pt has no memory issues     6CIT Screen 07/16/2018 01/06/2017  What Year? 0 points 0 points  What month? 0 points 0 points  What time? 0 points 0 points  Count back from 20 0 points 0 points  Months in reverse 0 points 0 points  Repeat phrase 0 points 4 points  Total Score 0 4    Immunization History  Administered Date(s) Administered  . Tdap 03/22/2010    Qualifies for Shingles Vaccine? Yes  . Due for Shingrix. Education has been provided regarding the importance of this vaccine. Pt has been advised to call insurance company to determine out of pocket expense. Advised may also receive vaccine at local pharmacy or Health Dept. Verbalized acceptance and understanding.  Tdap: pt allergic to tetanus  Flu Vaccine: Due for Flu vaccine. Does the patient want to receive this vaccine today?  No . Education has been provided regarding the importance of this vaccine but still declined. Advised may receive this vaccine at local pharmacy or Health Dept. Aware to provide a copy of the vaccination record if obtained from local pharmacy or Health Dept. Verbalized acceptance and understanding.  Pneumococcal Vaccine: Due for Pneumococcal vaccine at age 87.   Covid-19 Vaccine: Due for Covid-19 vaccine. Education has been provided regarding the importance of this vaccine. Patient advised may receive at local pharmacy, Health Dept or [facility]. Aware to provide a copy of the vaccination record once obtained. Verbalized acceptance and understanding.  Screening  Tests Health Maintenance  Topic Date Due  . COVID-19 Vaccine (1) Never done  . Fecal DNA (Cologuard)  Never done  . MAMMOGRAM  03/11/2015  . PAP SMEAR-Modifier  12/30/2026 (Originally 09/06/1984)  . INFLUENZA VACCINE  07/30/2020  . Hepatitis C Screening  Completed  . HIV Screening  Completed  . TETANUS/TDAP  Discontinued    Cancer Screenings:  Colorectal Screening: DUE. Pt declines referral for colonoscopy and has not completed Cologuard test.   Mammogram: Completed 03/10/14. Repeat every year. Ordered today. Pt provided with contact information and advised to call to schedule appt.   Bone Density: due age 43.   Lung Cancer Screening: (Low Dose CT Chest recommended if Age 8-80 years, 30 pack-year currently smoking OR have quit w/in 15years.) does qualify. Pt declines.  Additional Screening:  Hepatitis C Screening: does qualify; Completed 07/16/18  Vision Screening: Recommended annual ophthalmology exams for early detection of glaucoma and other disorders of the eye. Is the patient up to date with their annual eye exam?  No  Who is the provider or what is the name of the office in which the pt attends annual eye exams? Not established  Dental Screening: Recommended annual dental exams for proper oral hygiene  Community Resource Referral:  CRR required this visit?  No      Plan:     I have personally reviewed and addressed the Medicare Annual Wellness questionnaire and have noted the following in the patient's chart:  A. Medical and social history B. Use of alcohol, tobacco or illicit drugs  C. Current medications and supplements D. Functional ability and status E.  Nutritional status F.  Physical activity G. Advance directives H. List of other physicians I.  Hospitalizations, surgeries, and ER visits in previous 12 months J.  [place] such as hearing and vision if needed, cognitive and depression L. Referrals and appointments   In addition, I have  reviewed and discussed with patient certain preventive protocols, quality metrics, and best practice recommendations. A written personalized care plan for preventive services as well as general preventive health recommendations were provided to patient.   Signed,  Clemetine Marker, LPN Nurse Health Advisor   Nurse Notes: pt states she had prevoiusly discussed with Katha Cabal possibly using a daily maintenance inhaler. She is currently using albuterol approximately twice per week and also feels it is harder to breathe when she feels anxious or upset. Pt advised to schedule follow up appt to discuss.

## 2021-01-22 ENCOUNTER — Telehealth: Payer: Self-pay

## 2021-01-22 NOTE — Telephone Encounter (Signed)
Pt will need appt for refills on chol. Med has been over yr since seen for this.

## 2021-01-22 NOTE — Telephone Encounter (Signed)
Pt is scheduled °

## 2021-02-08 ENCOUNTER — Ambulatory Visit: Payer: Medicare Other | Admitting: Family Medicine

## 2021-02-14 DIAGNOSIS — Z79899 Other long term (current) drug therapy: Secondary | ICD-10-CM | POA: Diagnosis not present

## 2021-02-14 DIAGNOSIS — M5481 Occipital neuralgia: Secondary | ICD-10-CM | POA: Diagnosis not present

## 2021-02-14 DIAGNOSIS — M47814 Spondylosis without myelopathy or radiculopathy, thoracic region: Secondary | ICD-10-CM | POA: Diagnosis not present

## 2021-02-14 DIAGNOSIS — M533 Sacrococcygeal disorders, not elsewhere classified: Secondary | ICD-10-CM | POA: Diagnosis not present

## 2021-02-14 DIAGNOSIS — M791 Myalgia, unspecified site: Secondary | ICD-10-CM | POA: Diagnosis not present

## 2021-02-14 DIAGNOSIS — M4722 Other spondylosis with radiculopathy, cervical region: Secondary | ICD-10-CM | POA: Diagnosis not present

## 2021-02-14 DIAGNOSIS — M4726 Other spondylosis with radiculopathy, lumbar region: Secondary | ICD-10-CM | POA: Diagnosis not present

## 2021-02-14 DIAGNOSIS — G894 Chronic pain syndrome: Secondary | ICD-10-CM | POA: Diagnosis not present

## 2021-05-11 DIAGNOSIS — Z5181 Encounter for therapeutic drug level monitoring: Secondary | ICD-10-CM | POA: Diagnosis not present

## 2021-05-11 DIAGNOSIS — M5481 Occipital neuralgia: Secondary | ICD-10-CM | POA: Diagnosis not present

## 2021-05-11 DIAGNOSIS — M4726 Other spondylosis with radiculopathy, lumbar region: Secondary | ICD-10-CM | POA: Diagnosis not present

## 2021-05-11 DIAGNOSIS — M25552 Pain in left hip: Secondary | ICD-10-CM | POA: Diagnosis not present

## 2021-05-11 DIAGNOSIS — M25559 Pain in unspecified hip: Secondary | ICD-10-CM | POA: Diagnosis not present

## 2021-05-11 DIAGNOSIS — M791 Myalgia, unspecified site: Secondary | ICD-10-CM | POA: Diagnosis not present

## 2021-05-11 DIAGNOSIS — G894 Chronic pain syndrome: Secondary | ICD-10-CM | POA: Diagnosis not present

## 2021-05-11 DIAGNOSIS — Z79899 Other long term (current) drug therapy: Secondary | ICD-10-CM | POA: Diagnosis not present

## 2021-05-11 DIAGNOSIS — M4722 Other spondylosis with radiculopathy, cervical region: Secondary | ICD-10-CM | POA: Diagnosis not present

## 2021-05-11 DIAGNOSIS — M533 Sacrococcygeal disorders, not elsewhere classified: Secondary | ICD-10-CM | POA: Diagnosis not present

## 2021-05-11 DIAGNOSIS — M47814 Spondylosis without myelopathy or radiculopathy, thoracic region: Secondary | ICD-10-CM | POA: Diagnosis not present

## 2021-05-24 ENCOUNTER — Other Ambulatory Visit: Payer: Self-pay

## 2021-05-24 ENCOUNTER — Ambulatory Visit: Payer: Medicare Other | Admitting: Family Medicine

## 2021-05-24 ENCOUNTER — Encounter: Payer: Self-pay | Admitting: Unknown Physician Specialty

## 2021-05-24 ENCOUNTER — Ambulatory Visit (INDEPENDENT_AMBULATORY_CARE_PROVIDER_SITE_OTHER): Payer: Medicare Other | Admitting: Unknown Physician Specialty

## 2021-05-24 VITALS — BP 110/74 | HR 93 | Temp 98.0°F | Resp 16 | Ht 61.0 in | Wt 119.1 lb

## 2021-05-24 DIAGNOSIS — Z1211 Encounter for screening for malignant neoplasm of colon: Secondary | ICD-10-CM | POA: Diagnosis not present

## 2021-05-24 DIAGNOSIS — D509 Iron deficiency anemia, unspecified: Secondary | ICD-10-CM | POA: Diagnosis not present

## 2021-05-24 DIAGNOSIS — Z5181 Encounter for therapeutic drug level monitoring: Secondary | ICD-10-CM | POA: Diagnosis not present

## 2021-05-24 DIAGNOSIS — E785 Hyperlipidemia, unspecified: Secondary | ICD-10-CM | POA: Diagnosis not present

## 2021-05-24 DIAGNOSIS — Z1231 Encounter for screening mammogram for malignant neoplasm of breast: Secondary | ICD-10-CM

## 2021-05-24 NOTE — Progress Notes (Signed)
BP 110/74   Pulse 93   Temp 98 F (36.7 C)   Resp 16   Ht 5\' 1"  (1.549 m)   Wt 119 lb 1.6 oz (54 kg)   SpO2 95%   BMI 22.50 kg/m    Subjective:    Patient ID: Annette Arnold, female    DOB: 12/15/1961, 60 y.o.   MRN: [id number]  HPI: Annette Arnold is a 60 y.o. female  Chief Complaint  Patient presents with  . Follow-up  . Hyperlipidemia    Hyperlipidemia Using medications without problems: No Muscle aches  Diet compliance: not following any diet Exercise: No real exercise.  Being treated for chronic pain  Anemia Noted 6 months ago on chart review.  Lost to f/u.  Will recheck today. Not taking oral iron therapy.  Labs consistent with iron deficiency.  No menses.  No real change in labs.  No GI w/u for the anemia that I can see.   Relevant past medical, surgical, family and social history reviewed and updated as indicated. Interim medical history since our last visit reviewed. Allergies and medications reviewed and updated.  Review of Systems  Per HPI unless specifically indicated above     Objective:    BP 110/74   Pulse 93   Temp 98 F (36.7 C)   Resp 16   Ht 5\' 1"  (1.549 m)   Wt 119 lb 1.6 oz (54 kg)   SpO2 95%   BMI 22.50 kg/m   Wt Readings from Last 3 Encounters:  05/24/21 119 lb 1.6 oz (54 kg)  06/14/20 123 lb 6.4 oz (56 kg)  01/20/20 122 lb 9.6 oz (55.6 kg)    Physical Exam Constitutional:      General: She is not in acute distress.    Appearance: Normal appearance. She is well-developed.  HENT:     Head: Normocephalic and atraumatic.  Eyes:     General: Lids are normal. No scleral icterus.       Right eye: No discharge.        Left eye: No discharge.     Conjunctiva/sclera: Conjunctivae normal.  Neck:     Vascular: No carotid bruit or JVD.  Cardiovascular:     Rate and Rhythm: Normal rate and regular rhythm.     Heart sounds: Normal heart sounds.  Pulmonary:     Effort: Pulmonary effort is normal.     Breath sounds: Normal breath  sounds.  Abdominal:     Palpations: There is no hepatomegaly or splenomegaly.  Musculoskeletal:        General: Normal range of motion.     Cervical back: Normal range of motion and neck supple.  Skin:    General: Skin is warm and dry.     Coloration: Skin is not pale.     Findings: No rash.  Neurological:     Mental Status: She is alert and oriented to person, place, and time.  Psychiatric:        Behavior: Behavior normal.        Thought Content: Thought content normal.        Judgment: Judgment normal.     Results for orders placed or performed in visit on 01/20/20  CBC (INCLUDES DIFF/PLT) WITH PATHOLOGIST REVIEW  Result Value Ref Range   WBC 6.9 3.8 - 10.8 Thousand/uL   RBC 4.39 3.80 - 5.10 Million/uL   Hemoglobin 10.7 (L) 11.7 - 15.5 g/dL   HCT 33.9 (L) 35.0 - 45.0 %  MCV 77.2 (L) 80.0 - 100.0 fL   MCH 24.4 (L) 27.0 - 33.0 pg   MCHC 31.6 (L) 32.0 - 36.0 g/dL   RDW 14.7 11.0 - 15.0 %   Platelets 325 140 - 400 Thousand/uL   MPV 10.6 7.5 - 12.5 fL   Neutro Abs 4,064 1,500 - 7,800 cells/uL   Lymphs Abs 2,049 850 - 3,900 cells/uL   Absolute Monocytes 587 200 - 950 cells/uL   Eosinophils Absolute 138 15 - 500 cells/uL   Basophils Absolute 62 0 - 200 cells/uL   Neutrophils Relative % 58.9 %   Total Lymphocyte 29.7 %   Monocytes Relative 8.5 %   Eosinophils Relative 2.0 %   Basophils Relative 0.9 %   Comment        Assessment & Plan:   Problem List Items Addressed This Visit      Unprioritized   Hyperlipidemia    Check labs today.        Relevant Orders   Lipid panel    Other Visit Diagnoses    Screening for colon cancer    -  Primary   Relevant Orders   Cologuard   Encounter for screening mammogram for malignant neoplasm of breast       Relevant Orders   MM 3D SCREEN BREAST BILATERAL   Iron deficiency anemia, unspecified iron deficiency anemia type       Check CBC today.  If still anemic refer to GI for f/u   Relevant Orders   CBC with  Differential/Platelet   Medication monitoring encounter       Relevant Orders   Comprehensive metabolic panel       Follow up plan: Return in about 6 months (around 11/24/2021).

## 2021-05-24 NOTE — Assessment & Plan Note (Signed)
Check labs today.

## 2021-05-25 LAB — CBC WITH DIFFERENTIAL/PLATELET
Absolute Monocytes: 518 cells/uL (ref 200–950)
Basophils Absolute: 58 cells/uL (ref 0–200)
Basophils Relative: 0.8 %
Eosinophils Absolute: 108 cells/uL (ref 15–500)
Eosinophils Relative: 1.5 %
HCT: 38.1 % (ref 35.0–45.0)
Hemoglobin: 11.9 g/dL (ref 11.7–15.5)
Lymphs Abs: 2520 cells/uL (ref 850–3900)
MCH: 25.2 pg — ABNORMAL LOW (ref 27.0–33.0)
MCHC: 31.2 g/dL — ABNORMAL LOW (ref 32.0–36.0)
MCV: 80.7 fL (ref 80.0–100.0)
MPV: 11.1 fL (ref 7.5–12.5)
Monocytes Relative: 7.2 %
Neutro Abs: 3996 cells/uL (ref 1500–7800)
Neutrophils Relative %: 55.5 %
Platelets: 287 10*3/uL (ref 140–400)
RBC: 4.72 10*6/uL (ref 3.80–5.10)
RDW: 15.4 % — ABNORMAL HIGH (ref 11.0–15.0)
Total Lymphocyte: 35 %
WBC: 7.2 10*3/uL (ref 3.8–10.8)

## 2021-05-25 LAB — LIPID PANEL
Cholesterol: 279 mg/dL — ABNORMAL HIGH (ref <200)
HDL: 40 mg/dL — ABNORMAL LOW (ref >=50)
LDL Cholesterol (Calc): 193 mg/dL (calc) — ABNORMAL HIGH
Non-HDL Cholesterol (Calc): 239 mg/dL (calc) — ABNORMAL HIGH (ref <130)
Total CHOL/HDL Ratio: 7 (calc) — ABNORMAL HIGH (ref <5.0)
Triglycerides: 246 mg/dL — ABNORMAL HIGH (ref <150)

## 2021-05-25 LAB — COMPREHENSIVE METABOLIC PANEL
AG Ratio: 1.4 (calc) (ref 1.0–2.5)
ALT: 4 U/L — ABNORMAL LOW (ref 6–29)
AST: 10 U/L (ref 10–35)
Albumin: 4.2 g/dL (ref 3.6–5.1)
Alkaline phosphatase (APISO): 103 U/L (ref 37–153)
BUN: 10 mg/dL (ref 7–25)
CO2: 26 mmol/L (ref 20–32)
Calcium: 9.5 mg/dL (ref 8.6–10.4)
Chloride: 104 mmol/L (ref 98–110)
Creat: 0.91 mg/dL (ref 0.50–1.05)
Globulin: 3 g/dL (calc) (ref 1.9–3.7)
Glucose, Bld: 97 mg/dL (ref 65–99)
Potassium: 4.3 mmol/L (ref 3.5–5.3)
Sodium: 138 mmol/L (ref 135–146)
Total Bilirubin: 0.4 mg/dL (ref 0.2–1.2)
Total Protein: 7.2 g/dL (ref 6.1–8.1)

## 2021-05-27 ENCOUNTER — Encounter: Payer: Self-pay | Admitting: Family Medicine

## 2021-05-29 ENCOUNTER — Other Ambulatory Visit: Payer: Self-pay | Admitting: Unknown Physician Specialty

## 2021-05-29 DIAGNOSIS — E785 Hyperlipidemia, unspecified: Secondary | ICD-10-CM

## 2021-05-29 MED ORDER — ROSUVASTATIN CALCIUM 10 MG PO TABS: 10.0000 mg | ORAL_TABLET | Freq: Every day | ORAL | 3 refills | Status: DC

## 2021-06-19 ENCOUNTER — Ambulatory Visit (INDEPENDENT_AMBULATORY_CARE_PROVIDER_SITE_OTHER): Payer: Medicare Other

## 2021-06-19 DIAGNOSIS — Z0000 Encounter for general adult medical examination without abnormal findings: Secondary | ICD-10-CM | POA: Diagnosis not present

## 2021-06-19 NOTE — Progress Notes (Signed)
Subjective:   Annette Arnold is a 60 y.o. female who presents for Medicare Annual (Subsequent) preventive examination.  Virtual Visit via Telephone Note  I connected with  Annette Arnold on 06/19/21 at  2:10 PM EDT by telephone and verified that I am speaking with the correct person using two identifiers.  Location: Patient: home Provider: [place] Persons participating in the virtual visit: [place]   I discussed the limitations, risks, security and privacy concerns of performing an evaluation and management service by telephone and the availability of in person appointments. The patient expressed understanding and agreed to proceed.  Interactive audio and video telecommunications were attempted between this nurse and patient, however failed, due to patient having technical difficulties OR patient did not have access to video capability.  We continued and completed visit with audio only.  Some vital signs may be absent or patient reported.   Clemetine Marker, LPN   Review of Systems     Cardiac Risk Factors include: advanced age (>70mn, >>29women);dyslipidemia;smoking/ tobacco exposure     Objective:    Today's Vitals   06/19/21 1419  PainSc: 3    There is no height or weight on file to calculate BMI.  Advanced Directives 06/19/2021 06/15/2020 11/06/2018 07/16/2018 02/24/2018 10/24/2017 08/28/2017  Does Patient Have a Medical Advance Directive? _0  No No  Would patient like information on creating a medical advance directive? No - Patient declined Yes (MAU/Ambulatory/Procedural Areas - Information given) No - Patient declined Yes (MAU/Ambulatory/Procedural Areas - Information given) Yes (MAU/Ambulatory/Procedural Areas - Information given) - -  Some encounter information is confidential and restricted. Go to Review Flowsheets activity to see all data.    Current Medications (verified) Outpatient Encounter Medications as of 06/19/2021  Medication Sig    albuterol (VENTOLIN HFA) 108 (90 Base) MCG/ACT inhaler Inhale 2 puffs into the lungs every 6 (six) hours as needed for wheezing or shortness of breath.   Oxycodone HCl 10 MG TABS Take 1 tablet (10 mg total) by mouth 3 (three) times daily as needed.   polyethylene glycol (MIRALAX / GLYCOLAX) packet Take 17 g by mouth daily.   rosuvastatin (CRESTOR) 10 MG tablet Take 1 tablet (10 mg total) by mouth daily.   tiZANidine (ZANAFLEX) 4 MG tablet Take 1 tablet by mouth as needed.   No facility-administered encounter medications on file as of 06/19/2021.    Allergies (verified) Other, Codeine, and Tetanus toxoids   History: Past Medical History:  Diagnosis Date   Anxiety    [place]   Xray 2016   Chronic pain    Hyperlipidemia    Past Surgical History:  Procedure Laterality Date   ABDOMINAL HYSTERECTOMY     APPENDECTOMY     BREAST LUMPECTOMY     CARPAL TUNNEL RELEASE     CHOLECYSTECTOMY     NEPHRECTOMY Right    09/2017 @ [facility]   Family History  Problem Relation Age of Onset   Cancer Mother    Diabetes Mother    Thyroid disease Mother    Heart disease Father    Alcohol abuse Father    Cancer Father    Thyroid disease Sister    Heart disease Sister    Drug abuse Sister    Paranoid behavior Sister    Diabetes Sister    Hyperlipidemia Sister    Prostate cancer Neg Hx    Kidney cancer Neg Hx    Social History   Socioeconomic  History   Marital status: Divorced    Spouse name: Not on file   Number of children: 4   Years of education: Not on file   Highest education level: 8th grade  Occupational History   Occupation: Disabled  Tobacco Use   Smoking status: Every Day    Packs/day: 1.00    Years: 41.00    Pack years: 41.00    Types: Cigarettes   Smokeless tobacco: Never  Vaping Use   Vaping Use: Never used  Substance and Sexual Activity   Alcohol use: No   Drug use: No   Sexual activity: Yes  Other Topics Concern   Not on file   Social History Narrative   Lives with her son and his family   Social Determinants of Health   Financial Resource Strain: Low Risk    Difficulty of Paying Living Expenses: Not hard at all  Food Insecurity: No Food Insecurity   Worried About Charity fundraiser in the Last Year: Never true   [place] in the Last Year: Never true  Transportation Needs: No Transportation Needs   Lack of Transportation (Medical): No   Lack of Transportation (Non-Medical): No  Physical Activity: Inactive   Days of Exercise per Week: 0 days   Minutes of Exercise per Session: 0 min  Stress: No Stress Concern Present   Feeling of Stress : Not at all  Social Connections: Socially Isolated   Frequency of Communication with Friends and Family: More than three times a week   Frequency of Social Gatherings with Friends and Family: More than three times a week   Attends Religious Services: Never   Marine scientist or Organizations: No   Attends Music therapist: Never   Marital Status: Divorced    Tobacco Counseling Ready to quit: Not Answered Counseling given: Not Answered   Clinical Intake:  Pre-visit preparation completed: Yes  Pain : 0-10 Pain Score: 3  Pain Type: Chronic pain Pain Location: Back Pain Orientation: Lower Pain Descriptors / Indicators: Aching, Discomfort, Sore Pain Onset: More than a month ago Pain Frequency: Constant     Nutritional Risks: None  How often do you need to have someone help you when you read instructions, pamphlets, or other written materials from your doctor or pharmacy?: 1 - Never    Interpreter Needed?: No  Information entered by :: Clemetine Marker LPN   Activities of Daily Living In your present state of health, do you have any difficulty performing the following activities: 06/19/2021 05/24/2021  Hearing? N N  Comment declines hearing aids -  Vision? N N  Difficulty concentrating or making decisions? N N  Walking or  climbing stairs? N N  Dressing or bathing? N N  Doing errands, shopping? N N  Preparing Food and eating ? N -  Using the Toilet? N -  In the past six months, have you accidently leaked urine? N -  Do you have problems with loss of bowel control? N -  Managing your Medications? N -  Managing your Finances? N -  Housekeeping or managing your Housekeeping? N -  Some recent data might be hidden    Patient Care Team: Delsa Grana, PA-C as PCP - General (Family Medicine) Valetta Close, Jeannie Fend, PA-C as Consulting Physician  Indicate any recent Medical Services you may have received from other than [facility] providers in the past year (date may be approximate).     Assessment:   This is a  routine wellness examination for Maryan.  Hearing/Vision screen Hearing Screening - Comments:: Pt denies hearing difficulty Vision Screening - Comments:: Past due for eye exam. Denies vision problems.  Dietary issues and exercise activities discussed: Current Exercise Habits: The patient does not participate in regular exercise at present, Exercise limited by: orthopedic condition(s)   Goals Addressed             This Visit's Progress    DIET - INCREASE WATER INTAKE   On track    Recommend to drink at least 6-8 8oz glasses of water per day.      Increase physical activity       Recommend physical activity of at least 3 days per week         Depression Screen [facility] 2/9 Scores 06/19/2021 05/24/2021 06/15/2020 06/14/2020 01/20/2020 12/01/2019 10/06/2019  PHQ - 2 Score 0 0 0 0 0 0 0  PHQ- 9 Score - 0 - 0 2 0 0    Fall Risk Fall Risk  06/19/2021 05/24/2021 06/15/2020 06/14/2020 01/20/2020  Falls in the past year? 0 0 0 0 0  Comment - - - - -  Number falls in past yr: 0 0 0 0 0  Injury with Fall? 0 0 0 0 0  Risk for fall due to : No Fall Risks - No Fall Risks - -  Follow up Falls prevention discussed - Falls prevention discussed - -  Comment - - - - -    FALL RISK PREVENTION PERTAINING TO THE  HOME:  Any stairs in or around the home? Yes  If so, are there any without handrails? Yes  - a few outside steps Home free of loose throw rugs in walkways, pet beds, electrical cords, etc? Yes  Adequate lighting in your home to reduce risk of falls? Yes   ASSISTIVE DEVICES UTILIZED TO PREVENT FALLS:  Life alert? No  Use of a cane, walker or w/c? No  Grab bars in the bathroom? Yes  Shower chair or bench in shower? No  Elevated toilet seat or a handicapped toilet? No   TIMED UP AND GO:  Was the test performed? No . Telephonic visit.   Cognitive Function: Normal cognitive status assessed by direct observation by this Nurse Health Advisor. No abnormalities found.       6CIT Screen 07/16/2018 01/06/2017  What Year? 0 points 0 points  What month? 0 points 0 points  What time? 0 points 0 points  Count back from 20 0 points 0 points  Months in reverse 0 points 0 points  Repeat phrase 0 points 4 points  Total Score 0 4    Immunizations Immunization History  Administered Date(s) Administered   Tdap 03/22/2010   Tdap status: n/a due to allergy  Flu Vaccine status: Declined, Education has been provided regarding the importance of this vaccine but patient still declined. Advised may receive this vaccine at local pharmacy or Health Dept. Aware to provide a copy of the vaccination record if obtained from local pharmacy or Health Dept. Verbalized acceptance and understanding.  Pneumococcal vaccine status: Declined,  Education has been provided regarding the importance of this vaccine but patient still declined. Advised may receive this vaccine at local pharmacy or Health Dept. Aware to provide a copy of the vaccination record if obtained from local pharmacy or Health Dept. Verbalized acceptance and understanding.   Covid-19 vaccine status: Declined, Education has been provided regarding the importance of this vaccine but patient still declined. Advised may receive  this vaccine at local  pharmacy or Health Dept.or vaccine clinic. Aware to provide a copy of the vaccination record if obtained from local pharmacy or Health Dept. Verbalized acceptance and understanding.  Qualifies for Shingles Vaccine? Yes   Zostavax completed No   Shingrix Completed?: No.    Education has been provided regarding the importance of this vaccine. Patient has been advised to call insurance company to determine out of pocket expense if they have not yet received this vaccine. Advised may also receive vaccine at local pharmacy or Health Dept. Verbalized acceptance and understanding.  Screening Tests Health Maintenance  Topic Date Due   Pneumococcal Vaccine 70-78 Years old (1 - PCV) Never done   Zoster Vaccines- Shingrix (1 of 2) Never done   Fecal DNA (Cologuard)  Never done   MAMMOGRAM  03/11/2015   COVID-19 Vaccine (1) 07/05/2021 (Originally 09/06/1966)   PAP SMEAR-Modifier  12/30/2026 (Originally 09/06/1982)   INFLUENZA VACCINE  07/30/2021   Hepatitis C Screening  Completed   HIV Screening  Completed   HPV VACCINES  Aged Out   TETANUS/TDAP  Discontinued    Health Maintenance  Health Maintenance Due  Topic Date Due   Pneumococcal Vaccine 66-19 Years old (1 - PCV) Never done   Zoster Vaccines- Shingrix (1 of 2) Never done   Fecal DNA (Cologuard)  Never done   MAMMOGRAM  03/11/2015    Colorectal cancer screening: Cologuard ordered 05/24/21. Pt states she received a text message regarding Cologuard shipment and states it was shipped to wrong address. Pt has contact number to [employer] to request additional kit. Pt's address also updated today.   Mammogram status: Ordered 05/24/21. Pt provided with contact info and advised to call to schedule appt.   Bone density status: due age 44  Lung Cancer Screening: (Low Dose CT Chest recommended if Age 6-80 years, 30 pack-year currently smoking OR have quit w/in 15years.) does qualify.   Lung Cancer Screening Referral: An Epic message has been sent  to Burgess Estelle, RN (Oncology Nurse Navigator) regarding the possible need for this exam. Raquel Sarna will review the patient's chart to determine if the patient truly qualifies for the exam. If the patient qualifies, Raquel Sarna will order the Low Dose CT of the chest to facilitate the scheduling of this exam.   Additional Screening:  Hepatitis C Screening: does qualify; Completed 07/16/18  Vision Screening: Recommended annual ophthalmology exams for early detection of glaucoma and other disorders of the eye. Is the patient up to date with their annual eye exam?  No  Who is the provider or what is the name of the office in which the patient attends annual eye exams? Not established If pt is not established with a provider, would they like to be referred to a provider to establish care? No .   Dental Screening: Recommended annual dental exams for proper oral hygiene  Community Resource Referral / Chronic Care Management: CRR required this visit?  No   CCM required this visit?  No      Plan:     I have personally reviewed and noted the following in the patient's chart:   Medical and social history Use of alcohol, tobacco or illicit drugs  Current medications and supplements including opioid prescriptions.  Functional ability and status Nutritional status Physical activity Advanced directives List of other physicians Hospitalizations, surgeries, and ER visits in previous 12 months Vitals Screenings to include cognitive, depression, and falls Referrals and appointments  In addition, I have reviewed  and discussed with patient certain preventive protocols, quality metrics, and best practice recommendations. A written personalized care plan for preventive services as well as general preventive health recommendations were provided to patient.     Clemetine Marker, LPN   04/23/9562   Nurse Notes: none

## 2021-06-19 NOTE — Patient Instructions (Signed)
Annette Arnold , Thank you for taking time to come for your Medicare Wellness Visit. I appreciate your ongoing commitment to your health goals. Please review the following plan we discussed and let me know if I can assist you in the future.   Screening recommendations/referrals: Colonoscopy: Cologuard ordered 05/24/21 Mammogram: Ordered 05/24/21. Please call [phone] to schedule your mammogram.  Bone Density: due age 60 Recommended yearly ophthalmology/optometry visit for glaucoma screening and checkup Recommended yearly dental visit for hygiene and checkup  Vaccinations: Influenza vaccine: declined Pneumococcal vaccine: declined Tdap vaccine: n/a due to allergy  Shingles vaccine: Shingrix discussed. Please contact your pharmacy for coverage information.  Covid-19: declined  Advanced directives: Advance directive discussed with you today. Even though you declined this today please call our office should you change your mind and we can give you the proper paperwork for you to fill out.   Conditions/risks identified: If you wish to quit smoking, help is available. For free tobacco cessation program offerings call the [facility] at (854)635-4384 or Live Well Line at (862)113-6127. You may also visit [email or web address] or email [email or web address] for more information on other programs.   Next appointment: Follow up in one year for your annual wellness visit.  Preventive Care 40-64 Years, Female Preventive care refers to lifestyle choices and visits with your health care provider that can promote health and wellness. What does preventive care include? A yearly physical exam. This is also called an annual well check. Dental exams once or twice a year. Routine eye exams. Ask your health care provider how often you should have your eyes checked. Personal lifestyle choices, including: Daily care of your teeth and gums. Regular physical activity. Eating a healthy  diet. Avoiding tobacco and drug use. Limiting alcohol use. Practicing safe sex. Taking low-dose aspirin daily starting at age 41. Taking vitamin and mineral supplements as recommended by your health care provider. What happens during an annual well check? The services and screenings done by your health care provider during your annual well check will depend on your age, overall health, lifestyle risk factors, and family history of disease. Counseling  Your health care provider may ask you questions about your: Alcohol use. Tobacco use. Drug use. Emotional well-being. Home and relationship well-being. Sexual activity. Eating habits. Work and work Statistician. Method of birth control. Menstrual cycle. Pregnancy history. Screening  You may have the following tests or measurements: Height, weight, and BMI. Blood pressure. Lipid and cholesterol levels. These may be checked every 5 years, or more frequently if you are over 42 years old. Skin check. Lung cancer screening. You may have this screening every year starting at age 69 if you have a 30-pack-year history of smoking and currently smoke or have quit within the past 15 years. Fecal occult blood test (FOBT) of the stool. You may have this test every year starting at age 58. Flexible sigmoidoscopy or colonoscopy. You may have a sigmoidoscopy every 5 years or a colonoscopy every 10 years starting at age 28. Hepatitis C blood test. Hepatitis B blood test. Sexually transmitted disease (STD) testing. Diabetes screening. This is done by checking your blood sugar (glucose) after you have not eaten for a while (fasting). You may have this done every 1-3 years. Mammogram. This may be done every 1-2 years. Talk to your health care provider about when you should start having regular mammograms. This may depend on whether you have a family history of breast cancer. BRCA-related cancer screening. This may be  done if you have a family history of  breast, ovarian, tubal, or peritoneal cancers. Pelvic exam and Pap test. This may be done every 3 years starting at age 28. Starting at age 56, this may be done every 5 years if you have a Pap test in combination with an HPV test. Bone density scan. This is done to screen for osteoporosis. You may have this scan if you are at high risk for osteoporosis. Discuss your test results, treatment options, and if necessary, the need for more tests with your health care provider. Vaccines  Your health care provider may recommend certain vaccines, such as: Influenza vaccine. This is recommended every year. Tetanus, diphtheria, and acellular pertussis (Tdap, Td) vaccine. You may need a Td booster every 10 years. Zoster vaccine. You may need this after age 85. Pneumococcal 13-valent conjugate (PCV13) vaccine. You may need this if you have certain conditions and were not previously vaccinated. Pneumococcal polysaccharide (PPSV23) vaccine. You may need one or two doses if you smoke cigarettes or if you have certain conditions. Talk to your health care provider about which screenings and vaccines you need and how often you need them. This information is not intended to replace advice given to you by your health care provider. Make sure you discuss any questions you have with your health care provider. Document Released: 01/12/2016 Document Revised: 09/04/2016 Document Reviewed: 10/17/2015 Elsevier Interactive Patient Education  2017 [place] Prevention in the Home Falls can cause injuries. They can happen to people of all ages. There are many things you can do to make your home safe and to help prevent falls. What can I do on the outside of my home? Regularly fix the edges of walkways and driveways and fix any cracks. Remove anything that might make you trip as you walk through a door, such as a raised step or threshold. Trim any bushes or trees on the path to your home. Use bright outdoor  lighting. Clear any walking paths of anything that might make someone trip, such as rocks or tools. Regularly check to see if handrails are loose or broken. Make sure that both sides of any steps have handrails. Any raised decks and porches should have guardrails on the edges. Have any leaves, snow, or ice cleared regularly. Use sand or salt on walking paths during winter. Clean up any spills in your garage right away. This includes oil or grease spills. What can I do in the bathroom? Use night lights. Install grab bars by the toilet and in the tub and shower. Do not use towel bars as grab bars. Use non-skid mats or decals in the tub or shower. If you need to sit down in the shower, use a plastic, non-slip stool. Keep the floor dry. Clean up any water that spills on the floor as soon as it happens. Remove soap buildup in the tub or shower regularly. Attach bath mats securely with double-sided non-slip rug tape. Do not have throw rugs and other things on the floor that can make you trip. What can I do in the bedroom? Use night lights. Make sure that you have a light by your bed that is easy to reach. Do not use any sheets or blankets that are too big for your bed. They should not hang down onto the floor. Have a firm chair that has side arms. You can use this for support while you get dressed. Do not have throw rugs and other things on  the floor that can make you trip. What can I do in the kitchen? Clean up any spills right away. Avoid walking on wet floors. Keep items that you use a lot in easy-to-reach places. If you need to reach something above you, use a strong step stool that has a grab bar. Keep electrical cords out of the way. Do not use floor polish or wax that makes floors slippery. If you must use wax, use non-skid floor wax. Do not have throw rugs and other things on the floor that can make you trip. What can I do with my stairs? Do not leave any items on the stairs. Make  sure that there are handrails on both sides of the stairs and use them. Fix handrails that are broken or loose. Make sure that handrails are as long as the stairways. Check any carpeting to make sure that it is firmly attached to the stairs. Fix any carpet that is loose or worn. Avoid having throw rugs at the top or bottom of the stairs. If you do have throw rugs, attach them to the floor with carpet tape. Make sure that you have a light switch at the top of the stairs and the bottom of the stairs. If you do not have them, ask someone to add them for you. What else can I do to help prevent falls? Wear shoes that: Do not have high heels. Have rubber bottoms. Are comfortable and fit you well. Are closed at the toe. Do not wear sandals. If you use a stepladder: Make sure that it is fully opened. Do not climb a closed stepladder. Make sure that both sides of the stepladder are locked into place. Ask someone to hold it for you, if possible. Clearly mark and make sure that you can see: Any grab bars or handrails. First and last steps. Where the edge of each step is. Use tools that help you move around (mobility aids) if they are needed. These include: Canes. Walkers. Scooters. Crutches. Turn on the lights when you go into a dark area. Replace any light bulbs as soon as they burn out. Set up your furniture so you have a clear path. Avoid moving your furniture around. If any of your floors are uneven, fix them. If there are any pets around you, be aware of where they are. Review your medicines with your doctor. Some medicines can make you feel dizzy. This can increase your chance of falling. Ask your doctor what other things that you can do to help prevent falls. This information is not intended to replace advice given to you by your health care provider. Make sure you discuss any questions you have with your health care provider. Document Released: 10/12/2009 Document Revised: 05/23/2016  Document Reviewed: 01/20/2015 Elsevier Interactive Patient Education  2017 [employer].

## 2021-08-03 DIAGNOSIS — M533 Sacrococcygeal disorders, not elsewhere classified: Secondary | ICD-10-CM | POA: Diagnosis not present

## 2021-08-03 DIAGNOSIS — G894 Chronic pain syndrome: Secondary | ICD-10-CM | POA: Diagnosis not present

## 2021-08-03 DIAGNOSIS — Z79899 Other long term (current) drug therapy: Secondary | ICD-10-CM | POA: Diagnosis not present

## 2021-08-03 DIAGNOSIS — M791 Myalgia, unspecified site: Secondary | ICD-10-CM | POA: Diagnosis not present

## 2021-08-03 DIAGNOSIS — M47812 Spondylosis without myelopathy or radiculopathy, cervical region: Secondary | ICD-10-CM | POA: Diagnosis not present

## 2021-08-03 DIAGNOSIS — M4722 Other spondylosis with radiculopathy, cervical region: Secondary | ICD-10-CM | POA: Diagnosis not present

## 2021-08-03 DIAGNOSIS — M47816 Spondylosis without myelopathy or radiculopathy, lumbar region: Secondary | ICD-10-CM | POA: Diagnosis not present

## 2021-08-03 DIAGNOSIS — M25559 Pain in unspecified hip: Secondary | ICD-10-CM | POA: Diagnosis not present

## 2021-08-03 DIAGNOSIS — M5481 Occipital neuralgia: Secondary | ICD-10-CM | POA: Diagnosis not present

## 2021-08-03 DIAGNOSIS — M47814 Spondylosis without myelopathy or radiculopathy, thoracic region: Secondary | ICD-10-CM | POA: Diagnosis not present

## 2021-08-03 DIAGNOSIS — M5412 Radiculopathy, cervical region: Secondary | ICD-10-CM | POA: Diagnosis not present

## 2021-08-03 DIAGNOSIS — M5416 Radiculopathy, lumbar region: Secondary | ICD-10-CM | POA: Diagnosis not present

## 2021-08-27 NOTE — Progress Notes (Signed)
Name: Annette Arnold   MRN: [id number]    DOB: 1961/02/22   Date:08/28/2021       Progress Note  Subjective  Chief Complaint  Psoriasis Flare Up  HPI  Psoriasis: started to have symptoms in her mid 27's, she has seen Dermatologist in the past , she lost to follow up and was doing well, however a few weeks ago noticed recurrence on knuckles and elbows, over the past two days noticed some oozing on left elbow and pain to touch. She called Dermatologist but cannot be seen until January.   COPD: she started smoking in her teens, used to smoke 2 packs daily but currently down to 1 pack daily, not ready to quit but does not smoke inside the house to protect grandchildren. She has intermittent SOB with activity, no wheezing , but has occasional cough. Takes albuterol prn but not recently. Discussed low dose CT scan . She is not interested at this time. Discussed importance of quitting smoking  Long term opiod dependence: she has chronic low back pain and also neck pain. She is under the care of pain clinic inside of [facility] in [place].   Atherosclerosis of aorta: taking statin, denies side effects. Goal LDL is below 70 . Last LDL was 193 but she out of medication at the time   Patient Active Problem List   Diagnosis Date Noted   Patellofemoral dysfunction of right knee 06/14/2020   MDD (major depressive disorder), recurrent episode, moderate ([place]) 10/06/2019   Renal cell carcinoma of right kidney ([place]) 10/06/2019   Chronic obstructive pulmonary disease ([place]) 10/06/2019   Choledochal cyst 09/29/2018   Tobacco abuse 07/22/2018   Alkaline phosphatase elevation 07/18/2018   Aorto-iliac atherosclerosis ([place]) 07/16/2018   RBBB 09/16/2017   Muscle spasms of neck 05/07/2017   Common bile duct dilation 02/26/2017   Hot flashes due to menopause 01/13/2017   Chronic upper back pain 06/19/2016   Chronic low back pain 06/19/2016   Right hip pain 06/19/2016   Anxiety disorder, unspecified 04/18/2016    Chronic, continuous use of opioids 03/14/2016   Constipation 02/29/2016   Chronic neck and back pain 06/06/2015   Nerve root pain 06/02/2015   Chronic LBP 06/02/2015   Chronic pain associated with significant psychosocial dysfunction 06/02/2015   Pain in shoulder 06/02/2015   Continuous opioid dependence ([place]) 06/02/2015   Hyperlipidemia 06/02/2015   Psoriasis 06/02/2015   Scoliosis (and kyphoscoliosis), idiopathic 06/02/2015   Counseling on substance use and abuse 06/02/2015   B12 deficiency 06/02/2015   Vitamin D deficiency 06/02/2015    Past Surgical History:  Procedure Laterality Date   ABDOMINAL HYSTERECTOMY     APPENDECTOMY     BREAST LUMPECTOMY     CARPAL TUNNEL RELEASE     CHOLECYSTECTOMY     NEPHRECTOMY Right    09/2017 @ [facility]    Family History  Problem Relation Age of Onset   Cancer Mother    Diabetes Mother    Thyroid disease Mother    Heart disease Father    Alcohol abuse Father    Cancer Father    Thyroid disease Sister    Heart disease Sister    Drug abuse Sister    Paranoid behavior Sister    Diabetes Sister    Hyperlipidemia Sister    Prostate cancer Neg Hx    Kidney cancer Neg Hx     Social History   Tobacco Use   Smoking status: Every Day    Packs/day: 1.00  Years: 41.00    Pack years: 41.00    Types: Cigarettes   Smokeless tobacco: Never  Substance Use Topics   Alcohol use: No     Current Outpatient Medications:    albuterol (VENTOLIN HFA) 108 (90 Base) MCG/ACT inhaler, Inhale 2 puffs into the lungs every 6 (six) hours as needed for wheezing or shortness of breath., Disp: 18 g, Rfl: 3   Oxycodone HCl 10 MG TABS, Take 1 tablet (10 mg total) by mouth 3 (three) times daily as needed., Disp: 90 tablet, Rfl: 0   polyethylene glycol (MIRALAX / GLYCOLAX) packet, Take 17 g by mouth daily., Disp: , Rfl:    rosuvastatin (CRESTOR) 10 MG tablet, Take 1 tablet (10 mg total) by mouth daily., Disp: 90 tablet, Rfl: 3   tiZANidine (ZANAFLEX) 4 MG  tablet, Take 1 tablet by mouth as needed., Disp: , Rfl:   Allergies  Allergen Reactions   Other Other (See Comments) and Anaphylaxis    Powder in surgical gloves    Codeine Nausea And Vomiting   Tetanus Toxoids Swelling    I personally reviewed active problem list, medication list, allergies, family history, social history, health maintenance with the patient/caregiver today.   ROS  Ten systems reviewed and is negative except as mentioned in HPI  Objective  Vitals:   08/28/21 1442  BP: 108/64  Pulse: 97  Resp: 16  Temp: 98.3 F (36.8 C)  SpO2: 99%  Weight: 109 lb (49.4 kg)  Height: '5\' 1"'$  (1.549 m)    Body mass index is 20.6 kg/m.  Physical Exam  Constitutional: Patient appears well-developed and thin  No distress.  HEENT: head atraumatic, normocephalic, pupils equal and reactive to light, neck supple Cardiovascular: Normal rate, regular rhythm and normal heart sounds.  No murmur heard. No BLE edema. Pulmonary/Chest: Effort normal and breath sounds normal. No respiratory distress. Abdominal: Soft.  There is no tenderness. Skin: erythematous patches covering both elbows, on the left some honey comb aspect , also redness and dryness on both dorsal hands Psychiatric: Patient has a normal mood and affect. behavior is normal. Judgment and thought content normal.   PHQ2/9: Depression screen [facility] 2/9 08/28/2021 06/19/2021 05/24/2021 06/15/2020 06/14/2020  Decreased Interest 0 0 0 0 0  Down, Depressed, Hopeless 0 0 0 0 0  PHQ - 2 Score 0 0 0 0 0  Altered sleeping - - 0 - 0  Tired, decreased energy - - 0 - 0  Change in appetite - - 0 - 0  Feeling bad or failure about yourself  - - 0 - 0  Trouble concentrating - - 0 - 0  Moving slowly or fidgety/restless - - 0 - 0  Suicidal thoughts - - 0 - 0  PHQ-9 Score - - 0 - 0  Difficult doing work/chores - - Not difficult at all - Not difficult at all  Some recent data might be hidden    phq 9 is negative   Fall Risk: Fall Risk   08/28/2021 06/19/2021 05/24/2021 06/15/2020 06/14/2020  Falls in the past year? 0 0 0 0 0  Comment - - - - -  Number falls in past yr: 0 0 0 0 0  Injury with Fall? 0 0 0 0 0  Risk for fall due to : No Fall Risks No Fall Risks - No Fall Risks -  Follow up Falls prevention discussed Falls prevention discussed - Falls prevention discussed -  Comment - - - - -  Functional Status Survey: Is the patient deaf or have difficulty hearing?: No Does the patient have difficulty seeing, even when wearing glasses/contacts?: No Does the patient have difficulty concentrating, remembering, or making decisions?: No Does the patient have difficulty walking or climbing stairs?: No Does the patient have difficulty dressing or bathing?: No Does the patient have difficulty doing errands alone such as visiting a doctor's office or shopping?: No    Assessment & Plan  1. Psoriasis  - triamcinolone cream (KENALOG) 0.1 %; Apply 1 application topically 2 (two) times daily.  Dispense: 453.6 g; Refill: 0  2. Continuous opioid dependence (HCC)  - naloxone (NARCAN) 0.4 MG/ML injection; Prn accidental overdose , may repeat 2-3 minutes and call 911  Dispense: 1 mL; Refill: 2  3. Aorto-iliac atherosclerosis ([place])  Back on statin therapy   4. Chronic obstructive pulmonary disease, unspecified COPD type ([place])  Discussed flu shot, covid, and pneumonia vaccine and she refused Not interested on maintenance medication   5. Secondary infection of skin  - amoxicillin (AMOXIL) 400 MG/5ML suspension; Take 5 mLs (400 mg total) by mouth 2 (two) times daily.  Dispense: 150 mL; Refill: 0

## 2021-08-28 ENCOUNTER — Ambulatory Visit (INDEPENDENT_AMBULATORY_CARE_PROVIDER_SITE_OTHER): Payer: Medicare Other | Admitting: Family Medicine

## 2021-08-28 ENCOUNTER — Other Ambulatory Visit: Payer: Self-pay

## 2021-08-28 ENCOUNTER — Encounter: Payer: Self-pay | Admitting: Family Medicine

## 2021-08-28 VITALS — BP 108/64 | HR 97 | Temp 98.3°F | Resp 16 | Ht 61.0 in | Wt 109.0 lb

## 2021-08-28 DIAGNOSIS — F1120 Opioid dependence, uncomplicated: Secondary | ICD-10-CM | POA: Diagnosis not present

## 2021-08-28 DIAGNOSIS — I708 Atherosclerosis of other arteries: Secondary | ICD-10-CM | POA: Diagnosis not present

## 2021-08-28 DIAGNOSIS — I700 Atherosclerosis of aorta: Secondary | ICD-10-CM

## 2021-08-28 DIAGNOSIS — L409 Psoriasis, unspecified: Secondary | ICD-10-CM

## 2021-08-28 DIAGNOSIS — J449 Chronic obstructive pulmonary disease, unspecified: Secondary | ICD-10-CM | POA: Diagnosis not present

## 2021-08-28 DIAGNOSIS — L0889 Other specified local infections of the skin and subcutaneous tissue: Secondary | ICD-10-CM

## 2021-08-28 MED ORDER — TRIAMCINOLONE ACETONIDE 0.1 % EX CREA: 1.0000 "application " | TOPICAL_CREAM | Freq: Two times a day (BID) | CUTANEOUS | 0 refills | Status: AC

## 2021-08-28 MED ORDER — AMOXICILLIN 400 MG/5ML PO SUSR: 400.0000 mg | Freq: Two times a day (BID) | ORAL | 0 refills | Status: DC

## 2021-08-28 MED ORDER — NALOXONE HCL 0.4 MG/ML IJ SOLN: INTRAMUSCULAR | 2 refills | Status: AC

## 2021-09-07 ENCOUNTER — Encounter: Payer: Self-pay | Admitting: Family Medicine

## 2021-09-07 ENCOUNTER — Telehealth (INDEPENDENT_AMBULATORY_CARE_PROVIDER_SITE_OTHER): Payer: Medicare Other | Admitting: Family Medicine

## 2021-09-07 ENCOUNTER — Ambulatory Visit: Payer: Self-pay | Admitting: *Deleted

## 2021-09-07 ENCOUNTER — Encounter: Payer: Self-pay | Admitting: *Deleted

## 2021-09-07 DIAGNOSIS — F419 Anxiety disorder, unspecified: Secondary | ICD-10-CM | POA: Diagnosis not present

## 2021-09-07 DIAGNOSIS — I700 Atherosclerosis of aorta: Secondary | ICD-10-CM

## 2021-09-07 DIAGNOSIS — I708 Atherosclerosis of other arteries: Secondary | ICD-10-CM | POA: Diagnosis not present

## 2021-09-07 DIAGNOSIS — F331 Major depressive disorder, recurrent, moderate: Secondary | ICD-10-CM | POA: Diagnosis not present

## 2021-09-07 MED ORDER — ESCITALOPRAM OXALATE 10 MG PO TABS: 10.0000 mg | ORAL_TABLET | Freq: Every day | ORAL | 0 refills | Status: DC

## 2021-09-07 MED ORDER — HYDROXYZINE HCL 10 MG PO TABS: 10.0000 mg | ORAL_TABLET | Freq: Three times a day (TID) | ORAL | 0 refills | Status: DC | PRN
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
## 2021-09-10 ENCOUNTER — Encounter: Payer: Self-pay | Admitting: Family Medicine

## 2021-10-17 ENCOUNTER — Telehealth (INDEPENDENT_AMBULATORY_CARE_PROVIDER_SITE_OTHER): Payer: Medicare Other | Admitting: Family Medicine

## 2021-10-17 ENCOUNTER — Encounter: Payer: Self-pay | Admitting: Family Medicine

## 2021-10-17 DIAGNOSIS — J069 Acute upper respiratory infection, unspecified: Secondary | ICD-10-CM

## 2021-10-17 MED ORDER — PREDNISONE 5 MG/ML PO CONC: 40.0000 mg | Freq: Every day | ORAL | 0 refills | Status: AC

## 2021-10-17 MED ORDER — AMOXICILLIN-POT CLAVULANATE 600-42.9 MG/5ML PO SUSR: 875.0000 mg | Freq: Two times a day (BID) | ORAL | 0 refills | Status: AC

## 2021-10-17 NOTE — Progress Notes (Signed)
Virtual Visit via Video Note  I connected with [place] on 10/17/21 at  9:40 AM EDT by a video enabled telemedicine application and verified that I am speaking with the correct person using two identifiers.  Location: Patient: home Provider: [facility]   I discussed the limitations of evaluation and management by telemedicine and the availability of in person appointments. The patient expressed understanding and agreed to proceed.  History of Present Illness:  UPPER RESPIRATORY TRACT INFECTION - symptom onset 10/15 - granddaughter with ear infection and URI, COVID negative - has not tested for COVID. - using albuterol prn, last use few days ago - no COVID immunizations  Fever: no Cough: yes, productive of green sputum Shortness of breath: no Wheezing: no Chest pain: no Chest tightness: yes Chest congestion: yes Runny nose: yes Sinus pressure: no Headache: no Ear pain: no  Ear pressure: no  Vomiting: no Rash: no Sick contacts: yes Relief with OTC cold/cough medications: yes  Treatments attempted: albuterol, robatussin    Observations/Objective:  Well appearing, in NAD. Speaks in full sentences, no resp distress.   Assessment and Plan:  VIRAL URI Doing well with mild sx. Will test for COVID, would be a candidate for COVID treatment if positive. Will treat for COPD exacerbation given change in sputum. Reviewed OTC symptom relief, self-quarantine guidelines, and emergency precautions.     I discussed the assessment and treatment plan with the patient. The patient was provided an opportunity to ask questions and all were answered. The patient agreed with the plan and demonstrated an understanding of the instructions.   The patient was advised to call back or seek an in-person evaluation if the symptoms worsen or if the condition fails to improve as anticipated.  I provided 9 minutes of non-face-to-face time during this encounter.   Myles Gip, DO

## 2021-10-17 NOTE — Patient Instructions (Signed)
It was great to see you!  Our plans for today:  - See below for self-isolation guidelines. You may end your quarantine if your test is negative, or if positive, once you are 10 days from symptom onset and fever free for 24 hours without use of tylenol or ibuprofen. Wear a well-fitting N95 mask if you have to go out and about. - We will be treating you with an antiviral for COVID if your test is positive. - I recommend getting vaccinated once you are healed from your current infection. - Certainly, if you are having difficulties breathing or unable to keep down fluids, go to the Emergency Department.   Take care and seek immediate care sooner if you develop any concerns.   Dr. Ky Barban     Person Under Monitoring Name: Annette Arnold  Location: [place] [address]   Infection Prevention Recommendations for Individuals Confirmed to have, or Being Evaluated for, 2019 Novel Coronavirus (COVID-19) Infection Who Receive Care at Home  Individuals who are confirmed to have, or are being evaluated for, COVID-19 should follow the prevention steps below until a healthcare provider or local or state health department says they can return to normal activities.  Stay home except to get medical care You should restrict activities outside your home, except for getting medical care. Do not go to work, school, or public areas, and do not use public transportation or taxis.  Call ahead before visiting your doctor Before your medical appointment, call the healthcare provider and tell them that you have, or are being evaluated for, COVID-19 infection. This will help the healthcare provider's office take steps to keep other people from getting infected. Ask your healthcare provider to call the local or state health department.  Monitor your symptoms Seek prompt medical attention if your illness is worsening (e.g., difficulty breathing). Before going to your medical appointment,  call the healthcare provider and tell them that you have, or are being evaluated for, COVID-19 infection. Ask your healthcare provider to call the local or state health department.  Wear a facemask You should wear a facemask that covers your nose and mouth when you are in the same room with other people and when you visit a healthcare provider. People who live with or visit you should also wear a facemask while they are in the same room with you.  Separate yourself from other people in your home As much as possible, you should stay in a different room from other people in your home. Also, you should use a separate bathroom, if available.  Avoid sharing household items You should not share dishes, drinking glasses, cups, eating utensils, towels, bedding, or other items with other people in your home. After using these items, you should wash them thoroughly with soap and water.  Cover your coughs and sneezes Cover your mouth and nose with a tissue when you cough or sneeze, or you can cough or sneeze into your sleeve. Throw used tissues in a lined trash can, and immediately wash your hands with soap and water for at least 20 seconds or use an alcohol-based hand rub.  Wash your [facility] your hands often and thoroughly with soap and water for at least 20 seconds. You can use an alcohol-based hand sanitizer if soap and water are not available and if your hands are not visibly dirty. Avoid touching your eyes, nose, and mouth with unwashed hands.   Prevention Steps for Caregivers and Household Members of Individuals  Confirmed to have, or Being Evaluated for, COVID-19 Infection Being Cared for in the Home  If you live with, or provide care at home for, a person confirmed to have, or being evaluated for, COVID-19 infection please follow these guidelines to prevent infection:  Follow healthcare provider's instructions Make sure that you understand and can help the patient follow any  healthcare provider instructions for all care.  Provide for the patient's basic needs You should help the patient with basic needs in the home and provide support for getting groceries, prescriptions, and other personal needs.  Monitor the patient's symptoms If they are getting sicker, call his or her medical provider and tell them that the patient has, or is being evaluated for, COVID-19 infection. This will help the healthcare provider's office take steps to keep other people from getting infected. Ask the healthcare provider to call the local or state health department.  Limit the number of people who have contact with the patient If possible, have only one caregiver for the patient. Other household members should stay in another home or place of residence. If this is not possible, they should stay in another room, or be separated from the patient as much as possible. Use a separate bathroom, if available. Restrict visitors who do not have an essential need to be in the home.  Keep older adults, very young children, and other sick people away from the patient Keep older adults, very young children, and those who have compromised immune systems or chronic health conditions away from the patient. This includes people with chronic heart, lung, or kidney conditions, diabetes, and cancer.  Ensure good ventilation Make sure that shared spaces in the home have good air flow, such as from an air conditioner or an opened window, weather permitting.  Wash your hands often Wash your hands often and thoroughly with soap and water for at least 20 seconds. You can use an alcohol based hand sanitizer if soap and water are not available and if your hands are not visibly dirty. Avoid touching your eyes, nose, and mouth with unwashed hands. Use disposable paper towels to dry your hands. If not available, use dedicated cloth towels and replace them when they become wet.  Wear a facemask and gloves Wear  a disposable facemask at all times in the room and gloves when you touch or have contact with the patient's blood, body fluids, and/or secretions or excretions, such as sweat, saliva, sputum, nasal mucus, vomit, urine, or feces.  Ensure the mask fits over your nose and mouth tightly, and do not touch it during use. Throw out disposable facemasks and gloves after using them. Do not reuse. Wash your hands immediately after removing your facemask and gloves. If your personal clothing becomes contaminated, carefully remove clothing and launder. Wash your hands after handling contaminated clothing. Place all used disposable facemasks, gloves, and other waste in a lined container before disposing them with other household waste. Remove gloves and wash your hands immediately after handling these items.  Do not share dishes, glasses, or other household items with the patient Avoid sharing household items. You should not share dishes, drinking glasses, cups, eating utensils, towels, bedding, or other items with a patient who is confirmed to have, or being evaluated for, COVID-19 infection. After the person uses these items, you should wash them thoroughly with soap and water.  Wash laundry thoroughly Immediately remove and wash clothes or bedding that have blood, body fluids, and/or secretions or excretions, such as sweat,  saliva, sputum, nasal mucus, vomit, urine, or feces, on them. Wear gloves when handling laundry from the patient. Read and follow directions on labels of laundry or clothing items and detergent. In general, wash and dry with the warmest temperatures recommended on the label.  Clean all areas the individual has used often Clean all touchable surfaces, such as counters, tabletops, doorknobs, bathroom fixtures, toilets, phones, keyboards, tablets, and bedside tables, every day. Also, clean any surfaces that may have blood, body fluids, and/or secretions or excretions on them. Wear gloves  when cleaning surfaces the patient has come in contact with. Use a diluted bleach solution (e.g., dilute bleach with 1 part bleach and 10 parts water) or a household disinfectant with a label that says EPA-registered for coronaviruses. To make a bleach solution at home, add 1 tablespoon of bleach to 1 quart (4 cups) of water. For a larger supply, add  cup of bleach to 1 gallon (16 cups) of water. Read labels of cleaning products and follow recommendations provided on product labels. Labels contain instructions for safe and effective use of the cleaning product including precautions you should take when applying the product, such as wearing gloves or eye protection and making sure you have good ventilation during use of the product. Remove gloves and wash hands immediately after cleaning.  Monitor yourself for signs and symptoms of illness Caregivers and household members are considered close contacts, should monitor their health, and will be asked to limit movement outside of the home to the extent possible. Follow the monitoring steps for close contacts listed on the symptom monitoring form.   ? If you have additional questions, contact your local health department or call the epidemiologist on call at [phone] (available 24/7). ? This guidance is subject to change. For the most up-to-date guidance from [facility], please refer to their website: [email or web address]

## 2021-11-30 DIAGNOSIS — M791 Myalgia, unspecified site: Secondary | ICD-10-CM | POA: Diagnosis not present

## 2021-11-30 DIAGNOSIS — F419 Anxiety disorder, unspecified: Secondary | ICD-10-CM | POA: Diagnosis not present

## 2021-11-30 DIAGNOSIS — Z79899 Other long term (current) drug therapy: Secondary | ICD-10-CM | POA: Diagnosis not present

## 2021-11-30 DIAGNOSIS — G894 Chronic pain syndrome: Secondary | ICD-10-CM | POA: Diagnosis not present

## 2021-11-30 DIAGNOSIS — M47814 Spondylosis without myelopathy or radiculopathy, thoracic region: Secondary | ICD-10-CM | POA: Diagnosis not present

## 2021-11-30 DIAGNOSIS — M5481 Occipital neuralgia: Secondary | ICD-10-CM | POA: Diagnosis not present

## 2021-11-30 DIAGNOSIS — M4722 Other spondylosis with radiculopathy, cervical region: Secondary | ICD-10-CM | POA: Diagnosis not present

## 2021-11-30 DIAGNOSIS — M25559 Pain in unspecified hip: Secondary | ICD-10-CM | POA: Diagnosis not present

## 2021-11-30 DIAGNOSIS — M4726 Other spondylosis with radiculopathy, lumbar region: Secondary | ICD-10-CM | POA: Diagnosis not present

## 2021-11-30 DIAGNOSIS — M533 Sacrococcygeal disorders, not elsewhere classified: Secondary | ICD-10-CM | POA: Diagnosis not present

## 2022-02-22 ENCOUNTER — Telehealth (INDEPENDENT_AMBULATORY_CARE_PROVIDER_SITE_OTHER): Payer: Medicare Other | Admitting: Nurse Practitioner

## 2022-02-22 DIAGNOSIS — J441 Chronic obstructive pulmonary disease with (acute) exacerbation: Secondary | ICD-10-CM

## 2022-02-22 DIAGNOSIS — J069 Acute upper respiratory infection, unspecified: Secondary | ICD-10-CM

## 2022-02-22 MED ORDER — PREDNISONE 10 MG (21) PO TBPK: ORAL_TABLET | ORAL | 0 refills | Status: DC

## 2022-02-22 MED ORDER — AMOXICILLIN-POT CLAVULANATE 600-42.9 MG/5ML PO SUSR: 875.0000 mg | Freq: Two times a day (BID) | ORAL | 0 refills | Status: AC

## 2022-02-22 MED ORDER — BENZONATATE 100 MG PO CAPS: 100.0000 mg | ORAL_CAPSULE | Freq: Three times a day (TID) | ORAL | 0 refills | Status: DC | PRN

## 2022-02-22 NOTE — Progress Notes (Signed)
Name: Annette Arnold   MRN: [id number]    DOB: December 08, 1961   Date:02/22/2022       Progress Note  Subjective  Chief Complaint  Chief Complaint  Patient presents with   Cough    Onset for week, coughing up green mucus, no COVID test done at the moment    I connected with  [name]  on 02/22/22 at 10:10 am by a video enabled telemedicine application and verified that I am speaking with the correct person using two identifiers.  I discussed the limitations of evaluation and management by telemedicine and the availability of in person appointments. The patient expressed understanding and agreed to proceed with a virtual visit  Staff also discussed with the patient that there may be a patient responsible charge related to this service. Patient Location: home Provider Location: [facility] Additional Individuals present: alone  HPI  URI:She says she has had a cough for about a week.  She declines covid and flu testing. She denies fever or chills. She says she is not really short of breath. She says that her mucous has changed and she does have a history of COPD.  COPD: She says she has noticed a change in sputum. She says it is now green. She has not used her albuterol inhaler.  She says she is coughing a lot, she says she is a little short of breath.  Will treat for copd exacerbation.   Patient Active Problem List   Diagnosis Date Noted   Patellofemoral dysfunction of right knee 06/14/2020   MDD (major depressive disorder), recurrent episode, moderate ([place]) 10/06/2019   Renal cell carcinoma of right kidney ([place]) 10/06/2019   Chronic obstructive pulmonary disease ([place]) 10/06/2019   Choledochal cyst 09/29/2018   Tobacco abuse 07/22/2018   Alkaline phosphatase elevation 07/18/2018   Aorto-iliac atherosclerosis ([place]) 07/16/2018   RBBB 09/16/2017   Muscle spasms of neck 05/07/2017   Common bile duct dilation 02/26/2017   Hot flashes due to menopause 01/13/2017   Chronic upper back pain  06/19/2016   Chronic low back pain 06/19/2016   Right hip pain 06/19/2016   Anxiety disorder, unspecified 04/18/2016   Chronic, continuous use of opioids 03/14/2016   Constipation 02/29/2016   Chronic neck and back pain 06/06/2015   Nerve root pain 06/02/2015   Chronic LBP 06/02/2015   Chronic pain associated with significant psychosocial dysfunction 06/02/2015   Pain in shoulder 06/02/2015   Continuous opioid dependence ([place]) 06/02/2015   Hyperlipidemia 06/02/2015   Psoriasis 06/02/2015   Scoliosis (and kyphoscoliosis), idiopathic 06/02/2015   Counseling on substance use and abuse 06/02/2015   B12 deficiency 06/02/2015   Vitamin D deficiency 06/02/2015    Social History   Tobacco Use   Smoking status: Every Day    Packs/day: 1.00    Years: 41.00    Pack years: 41.00    Types: Cigarettes   Smokeless tobacco: Never  Substance Use Topics   Alcohol use: No     Current Outpatient Medications:    albuterol (VENTOLIN HFA) 108 (90 Base) MCG/ACT inhaler, Inhale 2 puffs into the lungs every 6 (six) hours as needed for wheezing or shortness of breath., Disp: 18 g, Rfl: 3   busPIRone (BUSPAR) 10 MG tablet, Take 10 mg by mouth 3 (three) times daily as needed., Disp: , Rfl:    cyclobenzaprine (FLEXERIL) 10 MG tablet, Take 10 mg by mouth 3 (three) times daily as needed., Disp: , Rfl:    escitalopram (LEXAPRO) 10  MG tablet, Take 1 tablet (10 mg total) by mouth daily., Disp: 30 tablet, Rfl: 0   hydrOXYzine (ATARAX/VISTARIL) 10 MG tablet, Take 1 tablet (10 mg total) by mouth 3 (three) times daily as needed., Disp: 30 tablet, Rfl: 0   naloxone (NARCAN) 0.4 MG/ML injection, Prn accidental overdose , may repeat 2-3 minutes and call 911, Disp: 1 mL, Rfl: 2   Oxycodone HCl 10 MG TABS, Take 1 tablet (10 mg total) by mouth 3 (three) times daily as needed., Disp: 90 tablet, Rfl: 0   polyethylene glycol (MIRALAX / GLYCOLAX) packet, Take 17 g by mouth daily., Disp: , Rfl:    rosuvastatin (CRESTOR) 10  MG tablet, Take 1 tablet (10 mg total) by mouth daily., Disp: 90 tablet, Rfl: 3   tiZANidine (ZANAFLEX) 4 MG tablet, Take 1 tablet by mouth as needed., Disp: , Rfl:    triamcinolone cream (KENALOG) 0.1 %, Apply 1 application topically 2 (two) times daily., Disp: 453.6 g, Rfl: 0  Allergies  Allergen Reactions   Other Other (See Comments) and Anaphylaxis    Powder in surgical gloves    Codeine Nausea And Vomiting   Tetanus Toxoids Swelling    I personally reviewed active problem list, medication list, allergies, notes from last encounter with the patient/caregiver today.  ROS  Constitutional: Negative for fever or weight change.  Respiratory: Positive for cough and shortness of breath.   Cardiovascular: Negative for chest pain or palpitations.  Gastrointestinal: Negative for abdominal pain, no bowel changes.  Musculoskeletal: Negative for gait problem or joint swelling.  Skin: Negative for rash.  Neurological: Negative for dizziness or headache.  No other specific complaints in a complete review of systems (except as listed in HPI above).   Objective  Virtual encounter, vitals not obtained.  There is no height or weight on file to calculate BMI.  Nursing Note and Vital Signs reviewed.  Physical Exam  Awake, alert and oriented, speaking in complete sentences  No results found for this or any previous visit (from the past 72 hour(s)).  Assessment & Plan  1. Viral upper respiratory tract infection -OTC treatments mucinex -push fluids, get rest - benzonatate (TESSALON) 100 MG capsule; Take 1 capsule (100 mg total) by mouth 3 (three) times daily as needed for cough.  Dispense: 20 capsule; Refill: 0  2. COPD exacerbation (HCC)  - predniSONE (STERAPRED UNI-PAK 21 TAB) 10 MG (21) TBPK tablet; Take as directed on package.  (60 mg po on day 1, 50 mg po on day 2...)  Dispense: 21 tablet; Refill: 0 - amoxicillin-clavulanate (AUGMENTIN ES-600) 600-42.9 MG/5ML suspension; Take 7.3  mLs (875 mg total) by mouth 2 (two) times daily for 5 days.  Dispense: 73 mL; Refill: 0   -Red flags and when to present for emergency care or RTC including fever >101.15F, chest pain, shortness of breath, new/worsening/un-resolving symptoms,  reviewed with patient at time of visit. Follow up and care instructions discussed and provided in AVS. - I discussed the assessment and treatment plan with the patient. The patient was provided an opportunity to ask questions and all were answered. The patient agreed with the plan and demonstrated an understanding of the instructions.  I provided 15 minutes of non-face-to-face time during this encounter.  Bo Merino, FNP

## 2022-03-14 DIAGNOSIS — M4726 Other spondylosis with radiculopathy, lumbar region: Secondary | ICD-10-CM | POA: Diagnosis not present

## 2022-03-14 DIAGNOSIS — Z79891 Long term (current) use of opiate analgesic: Secondary | ICD-10-CM | POA: Diagnosis not present

## 2022-03-14 DIAGNOSIS — Z79899 Other long term (current) drug therapy: Secondary | ICD-10-CM | POA: Diagnosis not present

## 2022-03-14 DIAGNOSIS — M47814 Spondylosis without myelopathy or radiculopathy, thoracic region: Secondary | ICD-10-CM | POA: Diagnosis not present

## 2022-03-14 DIAGNOSIS — G894 Chronic pain syndrome: Secondary | ICD-10-CM | POA: Diagnosis not present

## 2022-03-14 DIAGNOSIS — M160 Bilateral primary osteoarthritis of hip: Secondary | ICD-10-CM | POA: Diagnosis not present

## 2022-03-14 DIAGNOSIS — M4722 Other spondylosis with radiculopathy, cervical region: Secondary | ICD-10-CM | POA: Diagnosis not present

## 2022-03-14 DIAGNOSIS — M5481 Occipital neuralgia: Secondary | ICD-10-CM | POA: Diagnosis not present

## 2022-03-14 DIAGNOSIS — M791 Myalgia, unspecified site: Secondary | ICD-10-CM | POA: Diagnosis not present

## 2022-03-14 DIAGNOSIS — M533 Sacrococcygeal disorders, not elsewhere classified: Secondary | ICD-10-CM | POA: Diagnosis not present

## 2022-03-14 DIAGNOSIS — Z5181 Encounter for therapeutic drug level monitoring: Secondary | ICD-10-CM | POA: Diagnosis not present

## 2022-03-14 DIAGNOSIS — F419 Anxiety disorder, unspecified: Secondary | ICD-10-CM | POA: Diagnosis not present

## 2022-03-26 ENCOUNTER — Encounter: Payer: Self-pay | Admitting: Family Medicine

## 2022-03-26 ENCOUNTER — Telehealth (INDEPENDENT_AMBULATORY_CARE_PROVIDER_SITE_OTHER): Payer: Medicare Other | Admitting: Family Medicine

## 2022-03-26 DIAGNOSIS — I708 Atherosclerosis of other arteries: Secondary | ICD-10-CM

## 2022-03-26 DIAGNOSIS — J209 Acute bronchitis, unspecified: Secondary | ICD-10-CM | POA: Diagnosis not present

## 2022-03-26 DIAGNOSIS — I700 Atherosclerosis of aorta: Secondary | ICD-10-CM

## 2022-03-26 DIAGNOSIS — F17200 Nicotine dependence, unspecified, uncomplicated: Secondary | ICD-10-CM

## 2022-03-26 DIAGNOSIS — F17210 Nicotine dependence, cigarettes, uncomplicated: Secondary | ICD-10-CM | POA: Diagnosis not present

## 2022-03-26 DIAGNOSIS — J449 Chronic obstructive pulmonary disease, unspecified: Secondary | ICD-10-CM | POA: Diagnosis not present

## 2022-03-26 MED ORDER — BENZONATATE 100 MG PO CAPS: 100.0000 mg | ORAL_CAPSULE | Freq: Three times a day (TID) | ORAL | 1 refills | Status: DC | PRN

## 2022-03-26 MED ORDER — ALBUTEROL SULFATE HFA 108 (90 BASE) MCG/ACT IN AERS: 2.0000 | INHALATION_SPRAY | Freq: Four times a day (QID) | RESPIRATORY_TRACT | 3 refills | Status: DC | PRN

## 2022-03-26 NOTE — Progress Notes (Signed)
? ?Name: Annette Arnold   MRN: [id number]    DOB: 1961/02/28   Date:03/26/2022 ? ?     Progress Note ? ?Subjective:  ? ? ?Chief Complaint ? ?Chief Complaint  ?Patient presents with  ? Cough  ?  Mainly Coughing up phlegm and randomly cough not constant. Pt states it probably came back from her recent cough she had in February.   ? ? ?I connected with  [name] on 03/26/22 at  3:40 PM EDT by telephone and verified that I am speaking with the correct person using two identifiers. ?  ?I discussed the limitations, risks, security and privacy concerns of performing an evaluation and management service by telephone and the availability of in person appointments. Staff also discussed with the patient that there may be a patient responsible charge related to this service.  Patient verbalized understanding and agreed to proceed with encounter. ?Patient Location: home ?Provider Location: [facility] ?Additional Individuals present: none ? ?Cough ?Pertinent negatives include no chest pain, chills, fever, shortness of breath or wheezing.  ?Hx of COPD per chart and imaging, although pt states she was never told this, current smoker, she presents with a few days of increased cough and sputum production green, denies nasal congestion, HA, fever, chills, sweats, sore throat, SOB, wheeze ?Cough is worst at night and in the am ?Last month she was treated for bronchitis and most sx improved with lingering cough - states she doesn't usually have chronic or smokers cough ?Has an old inhaler, no past daily inhalers or need for nebs ? ? ? ? ?Patient Active Problem List  ? Diagnosis Date Noted  ? Patellofemoral dysfunction of right knee 06/14/2020  ? MDD (major depressive disorder), recurrent episode, moderate ([place]) 10/06/2019  ? Renal cell carcinoma of right kidney ([place]) 10/06/2019  ? Chronic obstructive pulmonary disease ([place]) 10/06/2019  ? Choledochal cyst 09/29/2018  ? Tobacco abuse 07/22/2018  ? Alkaline phosphatase elevation  07/18/2018  ? Aorto-iliac atherosclerosis ([place]) 07/16/2018  ? RBBB 09/16/2017  ? Muscle spasms of neck 05/07/2017  ? Common bile duct dilation 02/26/2017  ? Hot flashes due to menopause 01/13/2017  ? Chronic upper back pain 06/19/2016  ? Chronic low back pain 06/19/2016  ? Right hip pain 06/19/2016  ? Anxiety disorder, unspecified 04/18/2016  ? Chronic, continuous use of opioids 03/14/2016  ? Constipation 02/29/2016  ? Chronic neck and back pain 06/06/2015  ? Nerve root pain 06/02/2015  ? Chronic LBP 06/02/2015  ? Chronic pain associated with significant psychosocial dysfunction 06/02/2015  ? Pain in shoulder 06/02/2015  ? Continuous opioid dependence ([place]) 06/02/2015  ? Hyperlipidemia 06/02/2015  ? Psoriasis 06/02/2015  ? Scoliosis (and kyphoscoliosis), idiopathic 06/02/2015  ? Counseling on substance use and abuse 06/02/2015  ? B12 deficiency 06/02/2015  ? Vitamin D deficiency 06/02/2015  ? ? ?Social History  ? ?Tobacco Use  ? Smoking status: Every Day  ?  Packs/day: 1.00  ?  Years: 41.00  ?  Pack years: 41.00  ?  Types: Cigarettes  ? Smokeless tobacco: Never  ?Substance Use Topics  ? Alcohol use: No  ? ? ? ?Current Outpatient Medications:  ?  albuterol (VENTOLIN HFA) 108 (90 Base) MCG/ACT inhaler, Inhale 2 puffs into the lungs every 6 (six) hours as needed for wheezing or shortness of breath., Disp: 18 g, Rfl: 3 ?  benzonatate (TESSALON) 100 MG capsule, Take 1 capsule (100 mg total) by mouth 3 (three) times daily as needed for cough., Disp: 20 capsule,  Rfl: 0 ?  busPIRone (BUSPAR) 10 MG tablet, Take 10 mg by mouth 3 (three) times daily as needed., Disp: , Rfl:  ?  cyclobenzaprine (FLEXERIL) 10 MG tablet, Take 10 mg by mouth 3 (three) times daily as needed., Disp: , Rfl:  ?  escitalopram (LEXAPRO) 10 MG tablet, Take 1 tablet (10 mg total) by mouth daily., Disp: 30 tablet, Rfl: 0 ?  hydrOXYzine (ATARAX/VISTARIL) 10 MG tablet, Take 1 tablet (10 mg total) by mouth 3 (three) times daily as needed., Disp: 30 tablet,  Rfl: 0 ?  naloxone (NARCAN) 0.4 MG/ML injection, Prn accidental overdose , may repeat 2-3 minutes and call 911, Disp: 1 mL, Rfl: 2 ?  Oxycodone HCl 10 MG TABS, Take 1 tablet (10 mg total) by mouth 3 (three) times daily as needed., Disp: 90 tablet, Rfl: 0 ?  polyethylene glycol (MIRALAX / GLYCOLAX) packet, Take 17 g by mouth daily., Disp: , Rfl:  ?  predniSONE (STERAPRED UNI-PAK 21 TAB) 10 MG (21) TBPK tablet, Take as directed on package.  (60 mg po on day 1, 50 mg po on day 2...), Disp: 21 tablet, Rfl: 0 ?  rosuvastatin (CRESTOR) 10 MG tablet, Take 1 tablet (10 mg total) by mouth daily., Disp: 90 tablet, Rfl: 3 ?  tiZANidine (ZANAFLEX) 4 MG tablet, Take 1 tablet by mouth as needed., Disp: , Rfl:  ?  triamcinolone cream (KENALOG) 0.1 %, Apply 1 application topically 2 (two) times daily., Disp: 453.6 g, Rfl: 0 ? ?Allergies  ?Allergen Reactions  ? Other Other (See Comments) and Anaphylaxis  ?  Powder in surgical gloves   ? Codeine Nausea And Vomiting  ? Tetanus Toxoids Swelling  ? ? ?Chart Review: ?I personally reviewed active problem list, medication list, allergies, family history, social history, health maintenance, notes from last encounter, lab results, imaging with the patient/caregiver today. ? ? ?Review of Systems  ?Constitutional: Negative.  Negative for activity change, appetite change, chills, diaphoresis, fatigue and fever.  ?HENT: Negative.    ?Eyes: Negative.   ?Respiratory:  Positive for cough. Negative for apnea, choking, chest tightness, shortness of breath, wheezing and stridor.   ?Cardiovascular: Negative.  Negative for chest pain, palpitations and leg swelling.  ?Gastrointestinal: Negative.   ?Endocrine: Negative.   ?Genitourinary: Negative.   ?Musculoskeletal: Negative.   ?Skin: Negative.   ?Allergic/Immunologic: Negative.   ?Neurological: Negative.   ?Hematological: Negative.   ?Psychiatric/Behavioral: Negative.    ?All other systems reviewed and are negative. ? ? ?Objective:  ? ? ?Virtual  encounter, vitals limited, only able to obtain the following ?There were no vitals filed for this visit. ?There is no height or weight on file to calculate BMI. ?Nursing Note and Vital Signs reviewed. ? ?Physical Exam ?Vitals and nursing note reviewed.  ?Pulmonary:  ?   Effort: No respiratory distress.  ?   Comments: No audible wheeze, able to speak in full and complete sentences, occasional coughing  ? ? ?PE limited by telephone encounter ? ?No results found for this or any previous visit (from the past 72 hour(s)). ? ?Assessment and Plan:  ? ?  ICD-10-CM   ?1. Acute bronchitis, unspecified organism  J20.9 benzonatate (TESSALON) 100 MG capsule  ?  albuterol (VENTOLIN HFA) 108 (90 Base) MCG/ACT inhaler  ?  Ambulatory referral to Pulmonology  ? mild sx, tx conservatively as noted below   ?  ?2. Chronic obstructive pulmonary disease, unspecified COPD type (HCC)  J44.9 benzonatate (TESSALON) 100 MG capsule  ?  albuterol (VENTOLIN HFA)  108 (90 Base) MCG/ACT inhaler  ?  Ambulatory referral to Pulmonology  ? has noted on imaging, needs confirmation with PFT's, though likely given sx and smoking hx  ?  ?3. Current smoker  F17.200 Ambulatory referral to Pulmonology  ?  Ambulatory Referral Lung Cancer Screening [facility]  ? 1 ppd, explained to pt how this causes increased mucus, higher chance of COPD/cancer   ?  ?4. Smoking greater than 30 pack years  F17.210 Ambulatory Referral Lung Cancer Screening [facility]  ? qualifies for lung cancer screening - ref to specialists for consult/eval  ?  ?5. Aorto-iliac atherosclerosis (HCC)  I70.0 albuterol (VENTOLIN HFA) 108 (90 Base) MCG/ACT inhaler  ? I70.8   ? on statin, monitoring  ?  ? ? ? ?Sx mild right now, started 1-2 d ago, slight increase in cough and sputum, no wheeze, SOB, chest tightness, sweats, fever ?Tessalon perles are effective for her - encouraged managing any allergies/postnasal drip, start mucinex, can use other OTC cough meds, refill on inhaler -  reviewed indications for use - encouraged her to let me know by thurs/fri if she is improving - if worsening I will send in steroids and +/- abx - explained however that bronchitis is usually viral and abx may have li

## 2022-04-08 ENCOUNTER — Other Ambulatory Visit: Payer: Self-pay

## 2022-04-08 DIAGNOSIS — Z1231 Encounter for screening mammogram for malignant neoplasm of breast: Secondary | ICD-10-CM

## 2022-04-09 DIAGNOSIS — M545 Low back pain, unspecified: Secondary | ICD-10-CM | POA: Diagnosis not present

## 2022-04-13 ENCOUNTER — Other Ambulatory Visit: Payer: Self-pay | Admitting: Unknown Physician Specialty

## 2022-04-13 DIAGNOSIS — E785 Hyperlipidemia, unspecified: Secondary | ICD-10-CM

## 2022-04-15 NOTE — Telephone Encounter (Signed)
Requested Prescriptions  ?Pending Prescriptions Disp Refills  ?? rosuvastatin (CRESTOR) 10 MG tablet [Pharmacy Med Name: ROSUVASTATIN '10MG'$  TABLETS] 90 tablet 3  ?  Sig: TAKE 1 TABLET(10 MG) BY MOUTH DAILY  ?  ? Cardiovascular:  Antilipid - Statins 2 Failed - 04/13/2022  8:03 AM  ?  ?  Failed - Lipid Panel in normal range within the last 12 months  ?  Cholesterol, Total  ?Date Value Ref Range Status  ?03/07/2016 262 (H) 100 - 199 mg/dL Final  ? ?Cholesterol  ?Date Value Ref Range Status  ?05/24/2021 279 (H) <200 mg/dL Final  ? ?LDL Cholesterol (Calc)  ?Date Value Ref Range Status  ?05/24/2021 193 (H) mg/dL (calc) Final  ?  Comment:  ?  LDL-C levels > or = 190 mg/dL may indicate familial  ?hypercholesterolemia (FH). Clinical assessment and  ?measurement of blood lipid levels should be  ?considered for all first degree relatives of  ?patients with an FH diagnosis.  ?For questions about testing for familial ?hypercholesterolemia, please call [facility] ?Client Services at [email or web address]. ?Duncan Dull, et al. J National Lipid Association  ?Recommendations for Patient-Centered Management of  ?Dyslipidemia: Part 1 Journal of Clinical Lipidology  ?2015;9(2), 129-169. ?Reference range: <100 ?Marland Kitchen ?Desirable range <100 mg/dL for primary prevention;   ?<70 mg/dL for patients with CHD or diabetic patients  ?with > or = 2 CHD risk factors. ?. ?LDL-C is now calculated using the Martin-Hopkins  ?calculation, which is a validated novel method providing  ?better accuracy than the Friedewald equation in the  ?estimation of LDL-C.  ?Cresenciano Genre et al. Annamaria Helling. 5643;329(51): 2061-2068  ?(http://education.[facility].com/faq/FAQ164) ?  ? ?HDL  ?Date Value Ref Range Status  ?05/24/2021 40 (L) > OR = 50 mg/dL Final  ?03/07/2016 46 >39 mg/dL Final  ? ?Triglycerides  ?Date Value Ref Range Status  ?05/24/2021 246 (H) <150 mg/dL Final  ?  Comment:  ?  . ?If a non-fasting specimen was collected, consider ?repeat triglyceride testing on a  fasting specimen ?if clinically indicated.  ?Garald Balding al. J. of Clin. Lipidol. 8841;6:606-301. ?. ?  ? ?  ?  ?  Passed - Cr in normal range and within 360 days  ?  Creat  ?Date Value Ref Range Status  ?05/24/2021 0.91 0.50 - 1.05 mg/dL Final  ?  Comment:  ?  For patients >33 years of age, the reference limit ?for Creatinine is approximately 13% higher for people ?identified as African-American. ?. ?  ?   ?  ?  Passed - Patient is not pregnant  ?  ?  Passed - Valid encounter within last 12 months  ?  Recent Outpatient Visits   ?      ? 2 weeks ago Acute bronchitis, unspecified organism  ? [facility] Ainsworth, Kristeen Miss, PA-C  ? 1 month ago Viral upper respiratory tract infection  ? [place], FNP  ? 6 months ago Viral URI  ? [place], DO  ? 7 months ago MDD (major depressive disorder), recurrent episode, moderate ([place])  ? [facility] Bane[place], Drue Stager, MD  ? 7 months ago Chronic obstructive pulmonary disease, unspecified COPD type ([place])  ? [facility] Steele Sizer, MD  ?  ?  ?Future Appointments   ?        ? In 2 months [place]   ?  ? ?  ?  ?  ? ? ?

## 2022-05-27 DIAGNOSIS — F17210 Nicotine dependence, cigarettes, uncomplicated: Secondary | ICD-10-CM | POA: Diagnosis not present

## 2022-05-27 DIAGNOSIS — F419 Anxiety disorder, unspecified: Secondary | ICD-10-CM | POA: Diagnosis not present

## 2022-05-27 DIAGNOSIS — R059 Cough, unspecified: Secondary | ICD-10-CM | POA: Diagnosis not present

## 2022-05-27 DIAGNOSIS — Z887 Allergy status to serum and vaccine status: Secondary | ICD-10-CM | POA: Diagnosis not present

## 2022-05-27 DIAGNOSIS — J441 Chronic obstructive pulmonary disease with (acute) exacerbation: Secondary | ICD-10-CM | POA: Diagnosis not present

## 2022-05-27 DIAGNOSIS — Z885 Allergy status to narcotic agent status: Secondary | ICD-10-CM | POA: Diagnosis not present

## 2022-05-27 DIAGNOSIS — Z20822 Contact with and (suspected) exposure to covid-19: Secondary | ICD-10-CM | POA: Diagnosis not present

## 2022-05-27 DIAGNOSIS — Z716 Tobacco abuse counseling: Secondary | ICD-10-CM | POA: Diagnosis not present

## 2022-05-27 DIAGNOSIS — G8929 Other chronic pain: Secondary | ICD-10-CM | POA: Diagnosis not present

## 2022-05-27 DIAGNOSIS — Z79899 Other long term (current) drug therapy: Secondary | ICD-10-CM | POA: Diagnosis not present

## 2022-05-27 DIAGNOSIS — Z792 Long term (current) use of antibiotics: Secondary | ICD-10-CM | POA: Diagnosis not present

## 2022-05-30 ENCOUNTER — Telehealth: Payer: Self-pay

## 2022-05-30 NOTE — Telephone Encounter (Signed)
Is this something we can do virtually?  Copied from [place] [id number] [phone]. Topic: General - Inquiry >> May 30, 2022  1:00 PM McGill, Nelva Bush wrote: Reason for CRM: Pt is requesting a virtual hospital follow-up. Pt was seen at [facility] on 5/29 for a COPD flare up and needs follow-up. However, pt stated she does not have a ride to the office and is requesting a virtual appointment.  Please advise.

## 2022-06-03 ENCOUNTER — Inpatient Hospital Stay: Payer: Medicare Other | Admitting: Family Medicine

## 2022-06-03 DIAGNOSIS — J449 Chronic obstructive pulmonary disease, unspecified: Secondary | ICD-10-CM

## 2022-06-06 DIAGNOSIS — M47812 Spondylosis without myelopathy or radiculopathy, cervical region: Secondary | ICD-10-CM | POA: Diagnosis not present

## 2022-06-06 DIAGNOSIS — M5481 Occipital neuralgia: Secondary | ICD-10-CM | POA: Diagnosis not present

## 2022-06-06 DIAGNOSIS — M5416 Radiculopathy, lumbar region: Secondary | ICD-10-CM | POA: Diagnosis not present

## 2022-06-06 DIAGNOSIS — G894 Chronic pain syndrome: Secondary | ICD-10-CM | POA: Diagnosis not present

## 2022-06-06 DIAGNOSIS — M533 Sacrococcygeal disorders, not elsewhere classified: Secondary | ICD-10-CM | POA: Diagnosis not present

## 2022-06-06 DIAGNOSIS — F419 Anxiety disorder, unspecified: Secondary | ICD-10-CM | POA: Diagnosis not present

## 2022-06-06 DIAGNOSIS — Z79899 Other long term (current) drug therapy: Secondary | ICD-10-CM | POA: Diagnosis not present

## 2022-06-06 DIAGNOSIS — M47814 Spondylosis without myelopathy or radiculopathy, thoracic region: Secondary | ICD-10-CM | POA: Diagnosis not present

## 2022-06-06 DIAGNOSIS — M47816 Spondylosis without myelopathy or radiculopathy, lumbar region: Secondary | ICD-10-CM | POA: Diagnosis not present

## 2022-06-06 DIAGNOSIS — M25559 Pain in unspecified hip: Secondary | ICD-10-CM | POA: Diagnosis not present

## 2022-06-06 DIAGNOSIS — M5412 Radiculopathy, cervical region: Secondary | ICD-10-CM | POA: Diagnosis not present

## 2022-06-06 DIAGNOSIS — M791 Myalgia, unspecified site: Secondary | ICD-10-CM | POA: Diagnosis not present

## 2022-06-10 ENCOUNTER — Encounter: Payer: Self-pay | Admitting: Family Medicine

## 2022-06-10 ENCOUNTER — Ambulatory Visit (INDEPENDENT_AMBULATORY_CARE_PROVIDER_SITE_OTHER): Payer: Medicare Other | Admitting: Family Medicine

## 2022-06-10 VITALS — BP 104/62 | HR 91 | Temp 98.1°F | Resp 16 | Ht 61.0 in | Wt 109.9 lb

## 2022-06-10 DIAGNOSIS — F1120 Opioid dependence, uncomplicated: Secondary | ICD-10-CM

## 2022-06-10 DIAGNOSIS — D509 Iron deficiency anemia, unspecified: Secondary | ICD-10-CM

## 2022-06-10 DIAGNOSIS — I700 Atherosclerosis of aorta: Secondary | ICD-10-CM

## 2022-06-10 DIAGNOSIS — J449 Chronic obstructive pulmonary disease, unspecified: Secondary | ICD-10-CM

## 2022-06-10 DIAGNOSIS — F17210 Nicotine dependence, cigarettes, uncomplicated: Secondary | ICD-10-CM

## 2022-06-10 DIAGNOSIS — Z5181 Encounter for therapeutic drug level monitoring: Secondary | ICD-10-CM

## 2022-06-10 DIAGNOSIS — E559 Vitamin D deficiency, unspecified: Secondary | ICD-10-CM

## 2022-06-10 DIAGNOSIS — E785 Hyperlipidemia, unspecified: Secondary | ICD-10-CM | POA: Diagnosis not present

## 2022-06-10 DIAGNOSIS — E538 Deficiency of other specified B group vitamins: Secondary | ICD-10-CM

## 2022-06-10 DIAGNOSIS — I708 Atherosclerosis of other arteries: Secondary | ICD-10-CM

## 2022-06-10 DIAGNOSIS — F17200 Nicotine dependence, unspecified, uncomplicated: Secondary | ICD-10-CM

## 2022-06-10 MED ORDER — ROSUVASTATIN CALCIUM 10 MG PO TABS: ORAL_TABLET | ORAL | 3 refills | Status: AC

## 2022-06-10 MED ORDER — TRELEGY ELLIPTA 100-62.5-25 MCG/ACT IN AEPB: 1.0000 | INHALATION_SPRAY | Freq: Every day | RESPIRATORY_TRACT | 3 refills | Status: AC

## 2022-06-10 NOTE — Progress Notes (Signed)
Patient ID: ARLANDA SHIPLETT, female    DOB: Dec 19, 1961, 61 y.o.   MRN: [id number]  PCP: Delsa Grana, PA-C  Chief Complaint  Patient presents with   Hospitalization Follow-up    Pt was seen recently at ER and was told needs to be put on something else besides her inhaler.    Subjective:   VENISA FRAMPTON is a 61 y.o. female, presents to clinic with CC of the following:  HPI   Recurrent COPD exacerbations - pt never told she has COPD She endorses daily/chronic cough and sometimes productive sputum esp over the past couple weeks over the past year at least 3 episodes of needing abx and steroids Only SOB with walking a long ways - which she avoids No problem with flights of stairs  Tessalon and albuterol inhaler have been helpful  [facility] [place] in [facility]- reviewed ER record Got steroids and augmentin Windy Carina, MD - 05/27/2022   Formatting of this note might be different from the original. EXAM: XR CHEST 2 VIEWS DATE: 05/27/2022 12:51 PM ACCESSION: [id number] [facility] DICTATED: 05/27/2022 1:51 PM INTERPRETATION LOCATION: [place]: 61 years old Female with COUGH   TECHNIQUE: PA and Lateral Chest Radiographs.  COMPARISON: Chest radiograph 04/17/2010  FINDINGS:   Lungs are clear. No pleural effusion or pneumothorax.  Unremarkable cardiomediastinal silhouette.  Surgical clips overlie the left upper chest wall. Unchanged spinal curvature.  IMPRESSION:  Clear lungs.   Last OV was virtual and she was referred to pulmonology for PFT/eval but she never got an appt States her phone service at her house is very bad - she never got a msg Referral note shows they called multiple times and left a voice mail for her to call them back - [facility] in March this year   HLD - on crestor 10 - last labs pt had been off and out of meds, she's been taking w/o SE/concerns Lab Results  Component Value Date   CHOL 279 (H) 05/24/2021    HDL 40 (L) 05/24/2021   [place] 193 (H) 05/24/2021   TRIG 246 (H) 05/24/2021   CHOLHDL 7.0 (H) 05/24/2021   She reports having only one kidney - and she wants to know her renal function    Patient Active Problem List   Diagnosis Date Noted   Patellofemoral dysfunction of right knee 06/14/2020   MDD (major depressive disorder), recurrent episode, moderate ([place]) 10/06/2019   Renal cell carcinoma of right kidney ([place]) 10/06/2019   Chronic obstructive pulmonary disease ([place]) 10/06/2019   Choledochal cyst 09/29/2018   Smoking greater than 30 pack years 07/22/2018   Alkaline phosphatase elevation 07/18/2018   Aorto-iliac atherosclerosis ([place]) 07/16/2018   RBBB 09/16/2017   Muscle spasms of neck 05/07/2017   Common bile duct dilation 02/26/2017   Hot flashes due to menopause 01/13/2017   Chronic upper back pain 06/19/2016   Chronic low back pain 06/19/2016   Right hip pain 06/19/2016   Anxiety disorder, unspecified 04/18/2016   Chronic, continuous use of opioids 03/14/2016   Constipation 02/29/2016   Chronic neck and back pain 06/06/2015   Nerve root pain 06/02/2015   Chronic LBP 06/02/2015   Chronic pain associated with significant psychosocial dysfunction 06/02/2015   Pain in shoulder 06/02/2015   Continuous opioid dependence ([place]) 06/02/2015   Hyperlipidemia 06/02/2015   Psoriasis 06/02/2015   Scoliosis (and kyphoscoliosis), idiopathic 06/02/2015   Counseling on substance use and abuse 06/02/2015   B12 deficiency 06/02/2015  Vitamin D deficiency 06/02/2015      Current Outpatient Medications:    albuterol (VENTOLIN HFA) 108 (90 Base) MCG/ACT inhaler, Inhale 2 puffs into the lungs every 6 (six) hours as needed for wheezing or shortness of breath., Disp: 18 g, Rfl: 3   busPIRone (BUSPAR) 10 MG tablet, Take 10 mg by mouth 3 (three) times daily as needed., Disp: , Rfl:    cyclobenzaprine (FLEXERIL) 10 MG tablet, Take 10 mg by mouth 3 (three) times daily as needed., Disp: ,  Rfl:    naloxone (NARCAN) 0.4 MG/ML injection, Prn accidental overdose , may repeat 2-3 minutes and call 911, Disp: 1 mL, Rfl: 2   Oxycodone HCl 10 MG TABS, Take 1 tablet (10 mg total) by mouth 3 (three) times daily as needed., Disp: 90 tablet, Rfl: 0   polyethylene glycol (MIRALAX / GLYCOLAX) packet, Take 17 g by mouth daily., Disp: , Rfl:    rosuvastatin (CRESTOR) 10 MG tablet, TAKE 1 TABLET(10 MG) BY MOUTH DAILY, Disp: 90 tablet, Rfl: 0   triamcinolone cream (KENALOG) 0.1 %, Apply 1 application topically 2 (two) times daily., Disp: 453.6 g, Rfl: 0   benzonatate (TESSALON) 100 MG capsule, Take 1-2 capsules (100-200 mg total) by mouth 3 (three) times daily as needed for cough. (Patient not taking: Reported on 06/10/2022), Disp: 60 capsule, Rfl: 1   escitalopram (LEXAPRO) 10 MG tablet, Take 1 tablet (10 mg total) by mouth daily. (Patient not taking: Reported on 06/10/2022), Disp: 30 tablet, Rfl: 0   hydrOXYzine (ATARAX/VISTARIL) 10 MG tablet, Take 1 tablet (10 mg total) by mouth 3 (three) times daily as needed. (Patient not taking: Reported on 06/10/2022), Disp: 30 tablet, Rfl: 0   tiZANidine (ZANAFLEX) 4 MG tablet, Take 1 tablet by mouth as needed. (Patient not taking: Reported on 06/10/2022), Disp: , Rfl:    Allergies  Allergen Reactions   Other Other (See Comments) and Anaphylaxis    Powder in surgical gloves    Codeine Nausea And Vomiting   Tetanus Toxoids Swelling     Social History   Tobacco Use   Smoking status: Every Day    Packs/day: 1.00    Years: 41.00    Total pack years: 41.00    Types: Cigarettes   Smokeless tobacco: Never  Vaping Use   Vaping Use: Never used  Substance Use Topics   Alcohol use: No   Drug use: No      Chart Review Today: I personally reviewed active problem list, medication list, allergies, family history, social history, health maintenance, notes from last encounter, lab results, imaging with the patient/caregiver today.   Review of Systems   Constitutional: Negative.   HENT: Negative.    Eyes: Negative.   Respiratory: Negative.    Cardiovascular: Negative.   Gastrointestinal: Negative.   Endocrine: Negative.   Genitourinary: Negative.   Musculoskeletal: Negative.   Skin: Negative.   Allergic/Immunologic: Negative.   Neurological: Negative.   Hematological: Negative.   Psychiatric/Behavioral: Negative.    All other systems reviewed and are negative.      Objective:   Vitals:   06/10/22 1401  BP: 104/62  Pulse: 91  Resp: 16  Temp: 98.1 F (36.7 C)  TempSrc: Oral  SpO2: 97%  Weight: 109 lb 14.4 oz (49.9 kg)  Height: '5\' 1"'$  (1.549 m)    Body mass index is 20.77 kg/m.  Physical Exam Vitals and nursing note reviewed.  Constitutional:      General: She is not in acute distress.  Appearance: She is not ill-appearing, toxic-appearing or diaphoretic.  HENT:     Head: Normocephalic and atraumatic.     Right Ear: External ear normal.     Left Ear: External ear normal.     Nose: Nose normal.  Eyes:     General: No scleral icterus.       Right eye: No discharge.        Left eye: No discharge.     Conjunctiva/sclera: Conjunctivae normal.  Cardiovascular:     Rate and Rhythm: Normal rate and regular rhythm.     Pulses: Normal pulses.     Heart sounds: Normal heart sounds. No murmur heard.    No friction rub. No gallop.  Pulmonary:     Effort: Pulmonary effort is normal. No respiratory distress.     Breath sounds: Normal breath sounds. No stridor. No wheezing, rhonchi or rales.  Abdominal:     General: Bowel sounds are normal.     Palpations: Abdomen is soft.  Musculoskeletal:     Right lower leg: No edema.     Left lower leg: No edema.  Skin:    General: Skin is warm and dry.     Coloration: Skin is not jaundiced or pale.  Neurological:     Mental Status: She is alert. Mental status is at baseline.     Gait: Gait normal.  Psychiatric:        Mood and Affect: Mood normal.        Behavior:  Behavior normal.      Results for orders placed or performed in visit on 05/24/21  CBC with Differential/Platelet  Result Value Ref Range   WBC 7.2 3.8 - 10.8 Thousand/uL   RBC 4.72 3.80 - 5.10 Million/uL   Hemoglobin 11.9 11.7 - 15.5 g/dL   HCT 38.1 35.0 - 45.0 %   MCV 80.7 80.0 - 100.0 fL   MCH 25.2 (L) 27.0 - 33.0 pg   MCHC 31.2 (L) 32.0 - 36.0 g/dL   RDW 15.4 (H) 11.0 - 15.0 %   Platelets 287 140 - 400 Thousand/uL   MPV 11.1 7.5 - 12.5 fL   Neutro Abs 3,996 1,500 - 7,800 cells/uL   Lymphs Abs 2,520 850 - 3,900 cells/uL   Absolute Monocytes 518 200 - 950 cells/uL   Eosinophils Absolute 108 15 - 500 cells/uL   Basophils Absolute 58 0 - 200 cells/uL   Neutrophils Relative % 55.5 %   Total Lymphocyte 35.0 %   Monocytes Relative 7.2 %   Eosinophils Relative 1.5 %   Basophils Relative 0.8 %  Lipid panel  Result Value Ref Range   Cholesterol 279 (H) <200 mg/dL   HDL 40 (L) > OR = 50 mg/dL   Triglycerides 246 (H) <150 mg/dL   LDL Cholesterol (Calc) 193 (H) mg/dL (calc)   Total CHOL/HDL Ratio 7.0 (H) <5.0 (calc)   Non-HDL Cholesterol (Calc) 239 (H) <130 mg/dL (calc)  Comprehensive metabolic panel  Result Value Ref Range   Glucose, Bld 97 65 - 99 mg/dL   BUN 10 7 - 25 mg/dL   Creat 0.91 0.50 - 1.05 mg/dL   BUN/Creatinine Ratio NOT APPLICABLE 6 - 22 (calc)   Sodium 138 135 - 146 mmol/L   Potassium 4.3 3.5 - 5.3 mmol/L   Chloride 104 98 - 110 mmol/L   CO2 26 20 - 32 mmol/L   Calcium 9.5 8.6 - 10.4 mg/dL   Total Protein 7.2 6.1 - 8.1 g/dL   Albumin  4.2 3.6 - 5.1 g/dL   Globulin 3.0 1.9 - 3.7 g/dL (calc)   AG Ratio 1.4 1.0 - 2.5 (calc)   Total Bilirubin 0.4 0.2 - 1.2 mg/dL   Alkaline phosphatase (APISO) 103 37 - 153 U/L   AST 10 10 - 35 U/L   ALT 4 (L) 6 - 29 U/L       Assessment & Plan:     ICD-10-CM   1. Chronic obstructive pulmonary disease, unspecified COPD type ([place])  J44.9 Fluticasone-Umeclidin-Vilant (TRELEGY ELLIPTA) 100-62.5-25 MCG/ACT AEPB    Ambulatory  referral to Pulmonology   frequent exacerbations or acute bronchitis episodes with no maintenance and no prior PFT's, discussed maintenance inhalers, PFTs/pulm management    2. Hyperlipidemia, unspecified hyperlipidemia type  E78.5 rosuvastatin (CRESTOR) 10 MG tablet    COMPLETE METABOLIC PANEL WITH GFR    Lipid panel   med change, d/c pravastain and start crestor statin, labs recently checked, poor control, change in tx, will recheck in 3 months    3. Aorto-iliac atherosclerosis ([facility])  X45.0 COMPLETE METABOLIC PANEL WITH GFR   I70.8 Lipid panel   on statin, due for recheck labs    4. Continuous opioid dependence ([place])  F11.20    per specialists    5. Vitamin D deficiency  E55.9 rosuvastatin (CRESTOR) 10 MG tablet   not on supplement currently    6. B12 deficiency  E53.8 CBC with Differential/Platelet   not on supplement currently    7. Smoking greater than 30 pack years  T88.828 COMPLETE METABOLIC PANEL WITH GFR    CBC with Differential/Platelet    Ambulatory referral to Pulmonology    8. Iron deficiency anemia, unspecified iron deficiency anemia type  D50.9 CBC with Differential/Platelet   previously, H/H returned to normal range and iron panel was not rechecked - consider rechecking if sx - right now generalized fatigue    9. Current smoker  F17.200 Ambulatory referral to Pulmonology   over 41 packyears    10. Medication monitoring encounter  M03.49 COMPLETE METABOLIC PANEL WITH GFR    CBC with Differential/Platelet    Lipid panel         Delsa Grana, PA-C 06/10/22 2:11 PM

## 2022-06-11 LAB — COMPLETE METABOLIC PANEL WITH GFR
AG Ratio: 1.4 (calc) (ref 1.0–2.5)
ALT: 4 U/L — ABNORMAL LOW (ref 6–29)
AST: 12 U/L (ref 10–35)
Albumin: 4 g/dL (ref 3.6–5.1)
Alkaline phosphatase (APISO): 70 U/L (ref 37–153)
BUN: 14 mg/dL (ref 7–25)
CO2: 25 mmol/L (ref 20–32)
Calcium: 8.9 mg/dL (ref 8.6–10.4)
Chloride: 107 mmol/L (ref 98–110)
Creat: 0.79 mg/dL (ref 0.50–1.03)
Globulin: 2.9 g/dL (calc) (ref 1.9–3.7)
Glucose, Bld: 103 mg/dL — ABNORMAL HIGH (ref 65–99)
Potassium: 3.7 mmol/L (ref 3.5–5.3)
Sodium: 139 mmol/L (ref 135–146)
Total Bilirubin: 0.3 mg/dL (ref 0.2–1.2)
Total Protein: 6.9 g/dL (ref 6.1–8.1)
eGFR: 87 mL/min/{1.73_m2} (ref >=60)

## 2022-06-11 LAB — CBC WITH DIFFERENTIAL/PLATELET
Absolute Monocytes: 429 cells/uL (ref 200–950)
Basophils Absolute: 58 cells/uL (ref 0–200)
Basophils Relative: 0.9 %
Eosinophils Absolute: 128 cells/uL (ref 15–500)
Eosinophils Relative: 2 %
HCT: 32.4 % — ABNORMAL LOW (ref 35.0–45.0)
Hemoglobin: 10.3 g/dL — ABNORMAL LOW (ref 11.7–15.5)
Lymphs Abs: 2611 cells/uL (ref 850–3900)
MCH: 24.9 pg — ABNORMAL LOW (ref 27.0–33.0)
MCHC: 31.8 g/dL — ABNORMAL LOW (ref 32.0–36.0)
MCV: 78.3 fL — ABNORMAL LOW (ref 80.0–100.0)
MPV: 11 fL (ref 7.5–12.5)
Monocytes Relative: 6.7 %
Neutro Abs: 3174 cells/uL (ref 1500–7800)
Neutrophils Relative %: 49.6 %
Platelets: 254 10*3/uL (ref 140–400)
RBC: 4.14 10*6/uL (ref 3.80–5.10)
RDW: 16 % — ABNORMAL HIGH (ref 11.0–15.0)
Total Lymphocyte: 40.8 %
WBC: 6.4 10*3/uL (ref 3.8–10.8)

## 2022-06-11 LAB — LIPID PANEL
Cholesterol: 138 mg/dL (ref <200)
HDL: 47 mg/dL — ABNORMAL LOW (ref >=50)
LDL Cholesterol (Calc): 64 mg/dL (calc)
Non-HDL Cholesterol (Calc): 91 mg/dL (calc) (ref <130)
Total CHOL/HDL Ratio: 2.9 (calc) (ref <5.0)
Triglycerides: 205 mg/dL — ABNORMAL HIGH (ref <150)

## 2022-06-12 ENCOUNTER — Telehealth: Payer: Self-pay | Admitting: Family Medicine

## 2022-06-12 NOTE — Telephone Encounter (Signed)
Copied from [place] [id number]. Topic: General - Call Back - No Documentation >> Jun 12, 2022  2:17 PM Devoria Glassing wrote: Reason for CRM: pt would like call back to go over her labs from 6/12

## 2022-06-13 ENCOUNTER — Encounter: Payer: Self-pay | Admitting: Family Medicine

## 2022-06-13 NOTE — Telephone Encounter (Signed)
No answer from pt left vm to inform her provider has not reviewed yet her results, once she does we will give her a call.

## 2022-06-14 ENCOUNTER — Encounter: Payer: Self-pay | Admitting: Family Medicine

## 2022-06-20 ENCOUNTER — Encounter: Payer: Self-pay | Admitting: Physician Assistant

## 2022-06-20 ENCOUNTER — Ambulatory Visit (INDEPENDENT_AMBULATORY_CARE_PROVIDER_SITE_OTHER): Payer: Medicare Other | Admitting: Physician Assistant

## 2022-06-20 ENCOUNTER — Other Ambulatory Visit: Payer: Self-pay

## 2022-06-20 DIAGNOSIS — Z1283 Encounter for screening for malignant neoplasm of skin: Secondary | ICD-10-CM | POA: Diagnosis not present

## 2022-06-20 DIAGNOSIS — Z1211 Encounter for screening for malignant neoplasm of colon: Secondary | ICD-10-CM

## 2022-06-20 DIAGNOSIS — Z0000 Encounter for general adult medical examination without abnormal findings: Secondary | ICD-10-CM | POA: Diagnosis not present

## 2022-06-20 NOTE — Progress Notes (Deleted)
Established Patient Office Visit  Name: Annette Arnold   MRN: [id number]    DOB: [birth year]   Date:06/20/2022  Today's Provider: Talitha Givens, MHS, PA-C Introduced myself to the patient as a PA-C and provided education on APPs in clinical practice.         Subjective  Chief Complaint  Chief Complaint  Patient presents with   Medicare Wellness    HPI   Patient Active Problem List   Diagnosis Date Noted   Patellofemoral dysfunction of right knee 06/14/2020   MDD (major depressive disorder), recurrent episode, moderate ([place]) 10/06/2019   Renal cell carcinoma of right kidney ([place]) 10/06/2019   Chronic obstructive pulmonary disease ([place]) 10/06/2019   Choledochal cyst 09/29/2018   Smoking greater than 30 pack years 07/22/2018   Alkaline phosphatase elevation 07/18/2018   Aorto-iliac atherosclerosis ([place]) 07/16/2018   RBBB 09/16/2017   Muscle spasms of neck 05/07/2017   Common bile duct dilation 02/26/2017   Hot flashes due to menopause 01/13/2017   Chronic upper back pain 06/19/2016   Chronic low back pain 06/19/2016   Right hip pain 06/19/2016   Anxiety disorder, unspecified 04/18/2016   Chronic, continuous use of opioids 03/14/2016   Constipation 02/29/2016   Chronic neck and back pain 06/06/2015   Nerve root pain 06/02/2015   Chronic LBP 06/02/2015   Chronic pain associated with significant psychosocial dysfunction 06/02/2015   Pain in shoulder 06/02/2015   Continuous opioid dependence ([place]) 06/02/2015   Hyperlipidemia 06/02/2015   Psoriasis 06/02/2015   Scoliosis (and kyphoscoliosis), idiopathic 06/02/2015   Counseling on substance use and abuse 06/02/2015   B12 deficiency 06/02/2015   Vitamin D deficiency 06/02/2015    Past Surgical History:  Procedure Laterality Date   ABDOMINAL HYSTERECTOMY     APPENDECTOMY     BREAST LUMPECTOMY     CARPAL TUNNEL RELEASE     CHOLECYSTECTOMY     NEPHRECTOMY Right    09/2017 @ [facility]    Family History  Problem  Relation Age of Onset   Cancer Mother    Diabetes Mother    Thyroid disease Mother    Heart disease Father    Alcohol abuse Father    Cancer Father    Thyroid disease Sister    Heart disease Sister    Drug abuse Sister    Paranoid behavior Sister    Diabetes Sister    Hyperlipidemia Sister    Prostate cancer Neg Hx    Kidney cancer Neg Hx     Social History   Tobacco Use   Smoking status: Every Day    Packs/day: 1.00    Years: 41.00    Total pack years: 41.00    Types: Cigarettes   Smokeless tobacco: Never  Substance Use Topics   Alcohol use: No     Current Outpatient Medications:    albuterol (VENTOLIN HFA) 108 (90 Base) MCG/ACT inhaler, Inhale 2 puffs into the lungs every 6 (six) hours as needed for wheezing or shortness of breath., Disp: 18 g, Rfl: 3   busPIRone (BUSPAR) 10 MG tablet, Take 10 mg by mouth 3 (three) times daily as needed., Disp: , Rfl:    cyclobenzaprine (FLEXERIL) 10 MG tablet, Take 10 mg by mouth 3 (three) times daily as needed., Disp: , Rfl:    Fluticasone-Umeclidin-Vilant (TRELEGY ELLIPTA) 100-62.5-25 MCG/ACT AEPB, Inhale 1 puff into the lungs daily., Disp: 84 each, Rfl: 3   naloxone (NARCAN) 0.4 MG/ML injection, Prn  accidental overdose , may repeat 2-3 minutes and call 911, Disp: 1 mL, Rfl: 2   Oxycodone HCl 10 MG TABS, Take 1 tablet (10 mg total) by mouth 3 (three) times daily as needed., Disp: 90 tablet, Rfl: 0   polyethylene glycol (MIRALAX / GLYCOLAX) packet, Take 17 g by mouth daily., Disp: , Rfl:    rosuvastatin (CRESTOR) 10 MG tablet, TAKE 1 TABLET(10 MG) BY MOUTH DAILY, Disp: 90 tablet, Rfl: 3   triamcinolone cream (KENALOG) 0.1 %, Apply 1 application topically 2 (two) times daily., Disp: 453.6 g, Rfl: 0   tiZANidine (ZANAFLEX) 4 MG tablet, Take 1 tablet by mouth as needed. (Patient not taking: Reported on 06/10/2022), Disp: , Rfl:   Allergies  Allergen Reactions   Other Other (See Comments) and Anaphylaxis    Powder in surgical gloves     Codeine Nausea And Vomiting   Tetanus Toxoids Swelling    I personally reviewed {Reviewed:14835} with the patient/caregiver today.   ROS    Objective  There were no vitals filed for this visit.  There is no height or weight on file to calculate BMI.  Physical Exam   Recent Results (from the past 2160 hour(s))  COMPLETE METABOLIC PANEL WITH GFR     Status: Abnormal   Collection Time: 06/10/22  2:36 PM  Result Value Ref Range   Glucose, Bld 103 (H) 65 - 99 mg/dL    Comment: .            Fasting reference interval . For someone without known diabetes, a glucose value between 100 and 125 mg/dL is consistent with prediabetes and should be confirmed with a follow-up test. .    BUN 14 7 - 25 mg/dL   Creat 0.79 0.50 - 1.03 mg/dL   eGFR 87 > OR = 60 mL/min/1.15m    Comment: The eGFR is based on the CKD-EPI 2021 equation. To calculate  the new eGFR from a previous Creatinine or Cystatin C result, go to https://www.kidney.org/professionals/ kdoqi/gfr%5Fcalculator    BUN/Creatinine Ratio NOT APPLICABLE 6 - 22 (calc)   Sodium 139 135 - 146 mmol/L   Potassium 3.7 3.5 - 5.3 mmol/L   Chloride 107 98 - 110 mmol/L   CO2 25 20 - 32 mmol/L   Calcium 8.9 8.6 - 10.4 mg/dL   Total Protein 6.9 6.1 - 8.1 g/dL   Albumin 4.0 3.6 - 5.1 g/dL   Globulin 2.9 1.9 - 3.7 g/dL (calc)   AG Ratio 1.4 1.0 - 2.5 (calc)   Total Bilirubin 0.3 0.2 - 1.2 mg/dL   Alkaline phosphatase (APISO) 70 37 - 153 U/L   AST 12 10 - 35 U/L   ALT 4 (L) 6 - 29 U/L  CBC with Differential/Platelet     Status: Abnormal   Collection Time: 06/10/22  2:36 PM  Result Value Ref Range   WBC 6.4 3.8 - 10.8 Thousand/uL   RBC 4.14 3.80 - 5.10 Million/uL   Hemoglobin 10.3 (L) 11.7 - 15.5 g/dL   HCT 32.4 (L) 35.0 - 45.0 %   MCV 78.3 (L) 80.0 - 100.0 fL   MCH 24.9 (L) 27.0 - 33.0 pg   MCHC 31.8 (L) 32.0 - 36.0 g/dL   RDW 16.0 (H) 11.0 - 15.0 %   Platelets 254 140 - 400 Thousand/uL   MPV 11.0 7.5 - 12.5 fL   Neutro Abs  3,174 1,500 - 7,800 cells/uL   Lymphs Abs 2,611 850 - 3,900 cells/uL   Absolute Monocytes 429 200 -  950 cells/uL   Eosinophils Absolute 128 15 - 500 cells/uL   Basophils Absolute 58 0 - 200 cells/uL   Neutrophils Relative % 49.6 %   Total Lymphocyte 40.8 %   Monocytes Relative 6.7 %   Eosinophils Relative 2.0 %   Basophils Relative 0.9 %  Lipid panel     Status: Abnormal   Collection Time: 06/10/22  2:36 PM  Result Value Ref Range   Cholesterol 138 <200 mg/dL   HDL 47 (L) > OR = 50 mg/dL   Triglycerides 205 (H) <150 mg/dL    Comment: . If a non-fasting specimen was collected, consider repeat triglyceride testing on a fasting specimen if clinically indicated.  Yates Decamp et al. J. of Clin. Lipidol. 9449;6:759-163. Marland Kitchen    LDL Cholesterol (Calc) 64 mg/dL (calc)    Comment: Reference range: <100 . Desirable range <100 mg/dL for primary prevention;   <70 mg/dL for patients with CHD or diabetic patients  with > or = 2 CHD risk factors. Marland Kitchen LDL-C is now calculated using the Martin-Hopkins  calculation, which is a validated novel method providing  better accuracy than the Friedewald equation in the  estimation of LDL-C.  Cresenciano Genre et al. Annamaria Helling. 8466;599(35): 2061-2068  (http://education.[facility].com/faq/FAQ164)    Total CHOL/HDL Ratio 2.9 <5.0 (calc)   Non-HDL Cholesterol (Calc) 91 <130 mg/dL (calc)    Comment: For patients with diabetes plus 1 major ASCVD risk  factor, treating to a non-HDL-C goal of <100 mg/dL  (LDL-C of <70 mg/dL) is considered a therapeutic  option.      PHQ2/9:    06/20/2022   11:14 AM 06/10/2022    2:01 PM 03/26/2022   11:55 AM 02/22/2022    9:56 AM 10/17/2021    8:38 AM  Depression screen PHQ 2/9  Decreased Interest 0 0 0 0 0  Down, Depressed, Hopeless 0 0 0 0 0  PHQ - 2 Score 0 0 0 0 0  Altered sleeping 0 0 0 0 0  Tired, decreased energy 0 0 0 0 0  Change in appetite 0 0 0 0 0  Feeling bad or failure about yourself  0 0 0 0 0  Trouble  concentrating 0 0 0 0 0  Moving slowly or fidgety/restless 0 0 0 0 0  Suicidal thoughts 0 0 0 0 0  PHQ-9 Score 0 0 0 0 0  Difficult doing work/chores Not difficult at all Not difficult at all Not difficult at all Not difficult at all Not difficult at all      Fall Risk:    06/20/2022   11:14 AM 06/10/2022    2:01 PM 03/26/2022   11:55 AM 02/22/2022    9:56 AM 10/17/2021    8:37 AM  Fall Risk   Falls in the past year? 0 0 1 0 0  Number falls in past yr: 0 0 0 0 0  Injury with Fall? 0 0 0 0 0  Risk for fall due to :  No Fall Risks Impaired balance/gait No Fall Risks No Fall Risks  Follow up Falls evaluation completed Falls prevention discussed;Education provided Falls prevention discussed;Education provided Falls prevention discussed Falls prevention discussed      Functional Status Survey: Is the patient deaf or have difficulty hearing?: No Does the patient have difficulty seeing, even when wearing glasses/contacts?: No Does the patient have difficulty concentrating, remembering, or making decisions?: No Does the patient have difficulty walking or climbing stairs?: No Does the patient have difficulty dressing or bathing?:  No Does the patient have difficulty doing errands alone such as visiting a doctor's office or shopping?: No    Assessment & Plan

## 2022-06-20 NOTE — Patient Instructions (Addendum)
Thank you for taking time to come for your Medicare Wellness Visit. I appreciate your ongoing commitment to your health goals. Please review the following plan we discussed and let me know if I can assist you in the future.   Screening recommendations/referrals: Colonoscopy: never done. Recommend colonoscopy or Cologuard to be done this year. I have placed an order for this so it should be mailed to your house.  Mammogram: Order placed in April. Please reach out to schedule this Recommended yearly ophthalmology/optometry visit for glaucoma screening and checkup Recommended yearly dental visit for hygiene and checkup  Vaccinations: Influenza vaccine: Please complete in the fall  Pneumococcal vaccine: Not indicated at this time.  Tdap vaccine: Allergy, this is not indicated at this time.  Shingles vaccine: Please speak to your PCP and insurance company about getting this done   Covid-19: Not indicated.    Next appointment: Follow up in one year for your annual wellness visit.   Preventive Care 40-64 Years, Female Preventive care refers to lifestyle choices and visits with your health care provider that can promote health and wellness. What does preventive care include? A yearly physical exam. This is also called an annual well check. Dental exams once or twice a year. Routine eye exams. Ask your health care provider how often you should have your eyes checked. Personal lifestyle choices, including: Daily care of your teeth and gums. Regular physical activity. Eating a healthy diet. Avoiding tobacco and drug use. Limiting alcohol use. Practicing safe sex. Taking low-dose aspirin daily starting at age 28. Taking vitamin and mineral supplements as recommended by your health care provider. What happens during an annual well check? The services and screenings done by your health care provider during your annual well check will depend on your age, overall health, lifestyle risk factors,  and family history of disease. Counseling  Your health care provider may ask you questions about your: Alcohol use. Tobacco use. Drug use. Emotional well-being. Home and relationship well-being. Sexual activity. Eating habits. Work and work Statistician. Method of birth control. Menstrual cycle. Pregnancy history. Screening  You may have the following tests or measurements: Height, weight, and BMI. Blood pressure. Lipid and cholesterol levels. These may be checked every 5 years, or more frequently if you are over 3 years old. Skin check. Lung cancer screening. You may have this screening every year starting at age 68 if you have a 30-pack-year history of smoking and currently smoke or have quit within the past 15 years. Fecal occult blood test (FOBT) of the stool. You may have this test every year starting at age 45. Flexible sigmoidoscopy or colonoscopy. You may have a sigmoidoscopy every 5 years or a colonoscopy every 10 years starting at age 36. Hepatitis C blood test. Hepatitis B blood test. Sexually transmitted disease (STD) testing. Diabetes screening. This is done by checking your blood sugar (glucose) after you have not eaten for a while (fasting). You may have this done every 1-3 years. Mammogram. This may be done every 1-2 years. Talk to your health care provider about when you should start having regular mammograms. This may depend on whether you have a family history of breast cancer. BRCA-related cancer screening. This may be done if you have a family history of breast, ovarian, tubal, or peritoneal cancers. Pelvic exam and Pap test. This may be done every 3 years starting at age 43. Starting at age 78, this may be done every 5 years if you have a Pap test in combination  with an HPV test. Bone density scan. This is done to screen for osteoporosis. You may have this scan if you are at high risk for osteoporosis. Discuss your test results, treatment options, and if  necessary, the need for more tests with your health care provider. Vaccines  Your health care provider may recommend certain vaccines, such as: Influenza vaccine. This is recommended every year. Tetanus, diphtheria, and acellular pertussis (Tdap, Td) vaccine. You may need a Td booster every 10 years. Zoster vaccine. You may need this after age 71. Pneumococcal 13-valent conjugate (PCV13) vaccine. You may need this if you have certain conditions and were not previously vaccinated. Pneumococcal polysaccharide (PPSV23) vaccine. You may need one or two doses if you smoke cigarettes or if you have certain conditions. Talk to your health care provider about which screenings and vaccines you need and how often you need them. This information is not intended to replace advice given to you by your health care provider. Make sure you discuss any questions you have with your health care provider. Document Released: 01/12/2016 Document Revised: 09/04/2016 Document Reviewed: 10/17/2015 Elsevier Interactive Patient Education  2017 [place] Prevention in the Home Falls can cause injuries. They can happen to people of all ages. There are many things you can do to make your home safe and to help prevent falls. What can I do on the outside of my home? Regularly fix the edges of walkways and driveways and fix any cracks. Remove anything that might make you trip as you walk through a door, such as a raised step or threshold. Trim any bushes or trees on the path to your home. Use bright outdoor lighting. Clear any walking paths of anything that might make someone trip, such as rocks or tools. Regularly check to see if handrails are loose or broken. Make sure that both sides of any steps have handrails. Any raised decks and porches should have guardrails on the edges. Have any leaves, snow, or ice cleared regularly. Use sand or salt on walking paths during winter. Clean up any spills in your  garage right away. This includes oil or grease spills. What can I do in the bathroom? Use night lights. Install grab bars by the toilet and in the tub and shower. Do not use towel bars as grab bars. Use non-skid mats or decals in the tub or shower. If you need to sit down in the shower, use a plastic, non-slip stool. Keep the floor dry. Clean up any water that spills on the floor as soon as it happens. Remove soap buildup in the tub or shower regularly. Attach bath mats securely with double-sided non-slip rug tape. Do not have throw rugs and other things on the floor that can make you trip. What can I do in the bedroom? Use night lights. Make sure that you have a light by your bed that is easy to reach. Do not use any sheets or blankets that are too big for your bed. They should not hang down onto the floor. Have a firm chair that has side arms. You can use this for support while you get dressed. Do not have throw rugs and other things on the floor that can make you trip. What can I do in the kitchen? Clean up any spills right away. Avoid walking on wet floors. Keep items that you use a lot in easy-to-reach places. If you need to reach something above you, use a strong step stool that  has a grab bar. Keep electrical cords out of the way. Do not use floor polish or wax that makes floors slippery. If you must use wax, use non-skid floor wax. Do not have throw rugs and other things on the floor that can make you trip. What can I do with my stairs? Do not leave any items on the stairs. Make sure that there are handrails on both sides of the stairs and use them. Fix handrails that are broken or loose. Make sure that handrails are as long as the stairways. Check any carpeting to make sure that it is firmly attached to the stairs. Fix any carpet that is loose or worn. Avoid having throw rugs at the top or bottom of the stairs. If you do have throw rugs, attach them to the floor with carpet  tape. Make sure that you have a light switch at the top of the stairs and the bottom of the stairs. If you do not have them, ask someone to add them for you. What else can I do to help prevent falls? Wear shoes that: Do not have high heels. Have rubber bottoms. Are comfortable and fit you well. Are closed at the toe. Do not wear sandals. If you use a stepladder: Make sure that it is fully opened. Do not climb a closed stepladder. Make sure that both sides of the stepladder are locked into place. Ask someone to hold it for you, if possible. Clearly mark and make sure that you can see: Any grab bars or handrails. First and last steps. Where the edge of each step is. Use tools that help you move around (mobility aids) if they are needed. These include: Canes. Walkers. Scooters. Crutches. Turn on the lights when you go into a dark area. Replace any light bulbs as soon as they burn out. Set up your furniture so you have a clear path. Avoid moving your furniture around. If any of your floors are uneven, fix them. If there are any pets around you, be aware of where they are. Review your medicines with your doctor. Some medicines can make you feel dizzy. This can increase your chance of falling. Ask your doctor what other things that you can do to help prevent falls. This information is not intended to replace advice given to you by your health care provider. Make sure you discuss any questions you have with your health care provider. Document Released: 10/12/2009 Document Revised: 05/23/2016 Document Reviewed: 01/20/2015 Elsevier Interactive Patient Education  2017 [employer].

## 2022-08-07 ENCOUNTER — Other Ambulatory Visit: Payer: Self-pay

## 2022-08-07 DIAGNOSIS — J449 Chronic obstructive pulmonary disease, unspecified: Secondary | ICD-10-CM

## 2022-08-07 DIAGNOSIS — J209 Acute bronchitis, unspecified: Secondary | ICD-10-CM

## 2022-08-07 DIAGNOSIS — I700 Atherosclerosis of aorta: Secondary | ICD-10-CM

## 2022-08-07 MED ORDER — ALBUTEROL SULFATE HFA 108 (90 BASE) MCG/ACT IN AERS: 2.0000 | INHALATION_SPRAY | Freq: Four times a day (QID) | RESPIRATORY_TRACT | 3 refills | Status: DC | PRN

## 2022-08-15 ENCOUNTER — Telehealth: Payer: Self-pay

## 2022-08-15 DIAGNOSIS — I700 Atherosclerosis of aorta: Secondary | ICD-10-CM

## 2022-08-15 DIAGNOSIS — J209 Acute bronchitis, unspecified: Secondary | ICD-10-CM

## 2022-08-15 DIAGNOSIS — J449 Chronic obstructive pulmonary disease, unspecified: Secondary | ICD-10-CM

## 2022-08-15 MED ORDER — ALBUTEROL SULFATE HFA 108 (90 BASE) MCG/ACT IN AERS: 2.0000 | INHALATION_SPRAY | Freq: Four times a day (QID) | RESPIRATORY_TRACT | 3 refills | Status: DC | PRN

## 2022-08-15 NOTE — Telephone Encounter (Signed)
Inhaler sent to the one on [address]

## 2022-08-15 NOTE — Telephone Encounter (Signed)
I attempted to contact the patient to confirm if she would like her Ventolin inhaler sent to [facility] [address] or the [facility] location. The refill was submitted to [facility], but we keep getting refill request from [facility].

## 2022-08-15 NOTE — Addendum Note (Signed)
Addended by: Salomon Fick on: 08/15/2022 03:11 PM   Modules accepted: Orders

## 2022-08-15 NOTE — Telephone Encounter (Signed)
Pt returned call and would like script to go to [facility] / that is her preferred pharmacy

## 2022-09-02 DIAGNOSIS — Z905 Acquired absence of kidney: Secondary | ICD-10-CM | POA: Diagnosis not present

## 2022-09-02 DIAGNOSIS — F17210 Nicotine dependence, cigarettes, uncomplicated: Secondary | ICD-10-CM | POA: Diagnosis not present

## 2022-09-02 DIAGNOSIS — M5442 Lumbago with sciatica, left side: Secondary | ICD-10-CM | POA: Diagnosis not present

## 2022-09-02 DIAGNOSIS — Z791 Long term (current) use of non-steroidal anti-inflammatories (NSAID): Secondary | ICD-10-CM | POA: Diagnosis not present

## 2022-09-02 DIAGNOSIS — J449 Chronic obstructive pulmonary disease, unspecified: Secondary | ICD-10-CM | POA: Diagnosis not present

## 2022-09-02 DIAGNOSIS — M47816 Spondylosis without myelopathy or radiculopathy, lumbar region: Secondary | ICD-10-CM | POA: Diagnosis not present

## 2022-09-02 DIAGNOSIS — Z79899 Other long term (current) drug therapy: Secondary | ICD-10-CM | POA: Diagnosis not present

## 2022-09-02 DIAGNOSIS — M5441 Lumbago with sciatica, right side: Secondary | ICD-10-CM | POA: Diagnosis not present

## 2022-09-02 DIAGNOSIS — M5136 Other intervertebral disc degeneration, lumbar region: Secondary | ICD-10-CM | POA: Diagnosis not present

## 2022-09-05 DIAGNOSIS — M47816 Spondylosis without myelopathy or radiculopathy, lumbar region: Secondary | ICD-10-CM | POA: Diagnosis not present

## 2022-09-05 DIAGNOSIS — M533 Sacrococcygeal disorders, not elsewhere classified: Secondary | ICD-10-CM | POA: Diagnosis not present

## 2022-09-05 DIAGNOSIS — G894 Chronic pain syndrome: Secondary | ICD-10-CM | POA: Diagnosis not present

## 2022-09-05 DIAGNOSIS — M5481 Occipital neuralgia: Secondary | ICD-10-CM | POA: Diagnosis not present

## 2022-09-05 DIAGNOSIS — M5416 Radiculopathy, lumbar region: Secondary | ICD-10-CM | POA: Diagnosis not present

## 2022-09-05 DIAGNOSIS — M791 Myalgia, unspecified site: Secondary | ICD-10-CM | POA: Diagnosis not present

## 2022-09-05 DIAGNOSIS — F419 Anxiety disorder, unspecified: Secondary | ICD-10-CM | POA: Diagnosis not present

## 2022-09-05 DIAGNOSIS — M47814 Spondylosis without myelopathy or radiculopathy, thoracic region: Secondary | ICD-10-CM | POA: Diagnosis not present

## 2022-09-05 DIAGNOSIS — Z79899 Other long term (current) drug therapy: Secondary | ICD-10-CM | POA: Diagnosis not present

## 2022-09-05 DIAGNOSIS — M5412 Radiculopathy, cervical region: Secondary | ICD-10-CM | POA: Diagnosis not present

## 2022-09-05 DIAGNOSIS — M47812 Spondylosis without myelopathy or radiculopathy, cervical region: Secondary | ICD-10-CM | POA: Diagnosis not present

## 2022-09-30 DIAGNOSIS — M47812 Spondylosis without myelopathy or radiculopathy, cervical region: Secondary | ICD-10-CM | POA: Diagnosis not present

## 2022-09-30 DIAGNOSIS — M5412 Radiculopathy, cervical region: Secondary | ICD-10-CM | POA: Diagnosis not present

## 2022-09-30 DIAGNOSIS — M5416 Radiculopathy, lumbar region: Secondary | ICD-10-CM | POA: Diagnosis not present

## 2022-09-30 DIAGNOSIS — M791 Myalgia, unspecified site: Secondary | ICD-10-CM | POA: Diagnosis not present

## 2022-09-30 DIAGNOSIS — M5481 Occipital neuralgia: Secondary | ICD-10-CM | POA: Diagnosis not present

## 2022-09-30 DIAGNOSIS — G894 Chronic pain syndrome: Secondary | ICD-10-CM | POA: Diagnosis not present

## 2022-09-30 DIAGNOSIS — Z5181 Encounter for therapeutic drug level monitoring: Secondary | ICD-10-CM | POA: Diagnosis not present

## 2022-09-30 DIAGNOSIS — M533 Sacrococcygeal disorders, not elsewhere classified: Secondary | ICD-10-CM | POA: Diagnosis not present

## 2022-09-30 DIAGNOSIS — M25559 Pain in unspecified hip: Secondary | ICD-10-CM | POA: Diagnosis not present

## 2022-09-30 DIAGNOSIS — Z79899 Other long term (current) drug therapy: Secondary | ICD-10-CM | POA: Diagnosis not present

## 2022-09-30 DIAGNOSIS — M47816 Spondylosis without myelopathy or radiculopathy, lumbar region: Secondary | ICD-10-CM | POA: Diagnosis not present

## 2022-09-30 DIAGNOSIS — M47814 Spondylosis without myelopathy or radiculopathy, thoracic region: Secondary | ICD-10-CM | POA: Diagnosis not present

## 2022-09-30 DIAGNOSIS — M546 Pain in thoracic spine: Secondary | ICD-10-CM | POA: Diagnosis not present

## 2022-09-30 DIAGNOSIS — F419 Anxiety disorder, unspecified: Secondary | ICD-10-CM | POA: Diagnosis not present

## 2022-11-29 DIAGNOSIS — M47812 Spondylosis without myelopathy or radiculopathy, cervical region: Secondary | ICD-10-CM | POA: Diagnosis not present

## 2022-11-29 DIAGNOSIS — K5900 Constipation, unspecified: Secondary | ICD-10-CM | POA: Diagnosis not present

## 2022-11-29 DIAGNOSIS — M533 Sacrococcygeal disorders, not elsewhere classified: Secondary | ICD-10-CM | POA: Diagnosis not present

## 2022-11-29 DIAGNOSIS — M5481 Occipital neuralgia: Secondary | ICD-10-CM | POA: Diagnosis not present

## 2022-11-29 DIAGNOSIS — M791 Myalgia, unspecified site: Secondary | ICD-10-CM | POA: Diagnosis not present

## 2022-11-29 DIAGNOSIS — F419 Anxiety disorder, unspecified: Secondary | ICD-10-CM | POA: Diagnosis not present

## 2022-11-29 DIAGNOSIS — M5412 Radiculopathy, cervical region: Secondary | ICD-10-CM | POA: Diagnosis not present

## 2022-11-29 DIAGNOSIS — M47816 Spondylosis without myelopathy or radiculopathy, lumbar region: Secondary | ICD-10-CM | POA: Diagnosis not present

## 2022-11-29 DIAGNOSIS — Z79899 Other long term (current) drug therapy: Secondary | ICD-10-CM | POA: Diagnosis not present

## 2022-11-29 DIAGNOSIS — G894 Chronic pain syndrome: Secondary | ICD-10-CM | POA: Diagnosis not present

## 2022-11-29 DIAGNOSIS — M47814 Spondylosis without myelopathy or radiculopathy, thoracic region: Secondary | ICD-10-CM | POA: Diagnosis not present

## 2022-11-29 DIAGNOSIS — M5416 Radiculopathy, lumbar region: Secondary | ICD-10-CM | POA: Diagnosis not present

## 2022-12-12 ENCOUNTER — Ambulatory Visit: Payer: Medicare Other | Admitting: Dermatology

## 2022-12-28 DIAGNOSIS — M79604 Pain in right leg: Secondary | ICD-10-CM | POA: Diagnosis not present

## 2022-12-28 DIAGNOSIS — M79605 Pain in left leg: Secondary | ICD-10-CM | POA: Diagnosis not present

## 2022-12-28 DIAGNOSIS — M47819 Spondylosis without myelopathy or radiculopathy, site unspecified: Secondary | ICD-10-CM | POA: Diagnosis not present

## 2022-12-28 DIAGNOSIS — M5416 Radiculopathy, lumbar region: Secondary | ICD-10-CM | POA: Diagnosis not present

## 2022-12-28 DIAGNOSIS — E882 Lipomatosis, not elsewhere classified: Secondary | ICD-10-CM | POA: Diagnosis not present

## 2022-12-28 DIAGNOSIS — M48061 Spinal stenosis, lumbar region without neurogenic claudication: Secondary | ICD-10-CM | POA: Diagnosis not present

## 2022-12-28 DIAGNOSIS — M5126 Other intervertebral disc displacement, lumbar region: Secondary | ICD-10-CM | POA: Diagnosis not present

## 2023-01-09 DIAGNOSIS — K5900 Constipation, unspecified: Secondary | ICD-10-CM | POA: Diagnosis not present

## 2023-01-09 DIAGNOSIS — M47814 Spondylosis without myelopathy or radiculopathy, thoracic region: Secondary | ICD-10-CM | POA: Diagnosis not present

## 2023-01-09 DIAGNOSIS — M47816 Spondylosis without myelopathy or radiculopathy, lumbar region: Secondary | ICD-10-CM | POA: Diagnosis not present

## 2023-01-09 DIAGNOSIS — F419 Anxiety disorder, unspecified: Secondary | ICD-10-CM | POA: Diagnosis not present

## 2023-01-09 DIAGNOSIS — Z79899 Other long term (current) drug therapy: Secondary | ICD-10-CM | POA: Diagnosis not present

## 2023-01-09 DIAGNOSIS — M5481 Occipital neuralgia: Secondary | ICD-10-CM | POA: Diagnosis not present

## 2023-01-09 DIAGNOSIS — M5412 Radiculopathy, cervical region: Secondary | ICD-10-CM | POA: Diagnosis not present

## 2023-01-09 DIAGNOSIS — M791 Myalgia, unspecified site: Secondary | ICD-10-CM | POA: Diagnosis not present

## 2023-01-09 DIAGNOSIS — M47812 Spondylosis without myelopathy or radiculopathy, cervical region: Secondary | ICD-10-CM | POA: Diagnosis not present

## 2023-01-09 DIAGNOSIS — G894 Chronic pain syndrome: Secondary | ICD-10-CM | POA: Diagnosis not present

## 2023-01-09 DIAGNOSIS — M5416 Radiculopathy, lumbar region: Secondary | ICD-10-CM | POA: Diagnosis not present

## 2023-01-09 DIAGNOSIS — M533 Sacrococcygeal disorders, not elsewhere classified: Secondary | ICD-10-CM | POA: Diagnosis not present

## 2023-02-26 DIAGNOSIS — M47814 Spondylosis without myelopathy or radiculopathy, thoracic region: Secondary | ICD-10-CM | POA: Diagnosis not present

## 2023-02-26 DIAGNOSIS — M47816 Spondylosis without myelopathy or radiculopathy, lumbar region: Secondary | ICD-10-CM | POA: Diagnosis not present

## 2023-02-26 DIAGNOSIS — M791 Myalgia, unspecified site: Secondary | ICD-10-CM | POA: Diagnosis not present

## 2023-02-26 DIAGNOSIS — Z79899 Other long term (current) drug therapy: Secondary | ICD-10-CM | POA: Diagnosis not present

## 2023-02-26 DIAGNOSIS — G894 Chronic pain syndrome: Secondary | ICD-10-CM | POA: Diagnosis not present

## 2023-02-26 DIAGNOSIS — M5481 Occipital neuralgia: Secondary | ICD-10-CM | POA: Diagnosis not present

## 2023-02-26 DIAGNOSIS — M533 Sacrococcygeal disorders, not elsewhere classified: Secondary | ICD-10-CM | POA: Diagnosis not present

## 2023-02-26 DIAGNOSIS — K5900 Constipation, unspecified: Secondary | ICD-10-CM | POA: Diagnosis not present

## 2023-02-26 DIAGNOSIS — M47812 Spondylosis without myelopathy or radiculopathy, cervical region: Secondary | ICD-10-CM | POA: Diagnosis not present

## 2023-04-08 ENCOUNTER — Telehealth: Payer: Self-pay | Admitting: Family Medicine

## 2023-04-08 NOTE — Telephone Encounter (Signed)
Contacted Annette Arnold to schedule their annual wellness visit. Appointment made for 06/23/2023.  [facility] Care Guide [facility] AWV TEAM Direct Dial: [phone]

## 2023-04-10 DIAGNOSIS — M533 Sacrococcygeal disorders, not elsewhere classified: Secondary | ICD-10-CM | POA: Diagnosis not present

## 2023-04-10 DIAGNOSIS — M47816 Spondylosis without myelopathy or radiculopathy, lumbar region: Secondary | ICD-10-CM | POA: Diagnosis not present

## 2023-04-10 DIAGNOSIS — M5481 Occipital neuralgia: Secondary | ICD-10-CM | POA: Diagnosis not present

## 2023-04-10 DIAGNOSIS — G894 Chronic pain syndrome: Secondary | ICD-10-CM | POA: Diagnosis not present

## 2023-04-10 DIAGNOSIS — M47814 Spondylosis without myelopathy or radiculopathy, thoracic region: Secondary | ICD-10-CM | POA: Diagnosis not present

## 2023-04-10 DIAGNOSIS — Z79899 Other long term (current) drug therapy: Secondary | ICD-10-CM | POA: Diagnosis not present

## 2023-04-10 DIAGNOSIS — M47812 Spondylosis without myelopathy or radiculopathy, cervical region: Secondary | ICD-10-CM | POA: Diagnosis not present

## 2023-04-10 DIAGNOSIS — M791 Myalgia, unspecified site: Secondary | ICD-10-CM | POA: Diagnosis not present

## 2023-04-23 ENCOUNTER — Other Ambulatory Visit: Payer: Self-pay

## 2023-04-23 DIAGNOSIS — I700 Atherosclerosis of aorta: Secondary | ICD-10-CM

## 2023-04-23 DIAGNOSIS — J449 Chronic obstructive pulmonary disease, unspecified: Secondary | ICD-10-CM

## 2023-04-23 DIAGNOSIS — J209 Acute bronchitis, unspecified: Secondary | ICD-10-CM

## 2023-04-24 MED ORDER — ALBUTEROL SULFATE HFA 108 (90 BASE) MCG/ACT IN AERS: 2.0000 | INHALATION_SPRAY | Freq: Four times a day (QID) | RESPIRATORY_TRACT | 3 refills | Status: AC | PRN

## 2023-05-30 ENCOUNTER — Ambulatory Visit: Payer: 59 | Admitting: Family Medicine

## 2023-06-23 ENCOUNTER — Ambulatory Visit: Payer: Medicare Other | Admitting: Family Medicine

## 2023-07-09 DIAGNOSIS — M533 Sacrococcygeal disorders, not elsewhere classified: Secondary | ICD-10-CM | POA: Diagnosis not present

## 2023-07-09 DIAGNOSIS — Z79899 Other long term (current) drug therapy: Secondary | ICD-10-CM | POA: Diagnosis not present

## 2023-07-09 DIAGNOSIS — M47816 Spondylosis without myelopathy or radiculopathy, lumbar region: Secondary | ICD-10-CM | POA: Diagnosis not present

## 2023-07-09 DIAGNOSIS — M47814 Spondylosis without myelopathy or radiculopathy, thoracic region: Secondary | ICD-10-CM | POA: Diagnosis not present

## 2023-07-09 DIAGNOSIS — G894 Chronic pain syndrome: Secondary | ICD-10-CM | POA: Diagnosis not present

## 2023-07-09 DIAGNOSIS — M5481 Occipital neuralgia: Secondary | ICD-10-CM | POA: Diagnosis not present

## 2023-07-09 DIAGNOSIS — M47812 Spondylosis without myelopathy or radiculopathy, cervical region: Secondary | ICD-10-CM | POA: Diagnosis not present

## 2023-08-23 ENCOUNTER — Other Ambulatory Visit: Payer: Self-pay | Admitting: Family Medicine

## 2023-08-23 DIAGNOSIS — J449 Chronic obstructive pulmonary disease, unspecified: Secondary | ICD-10-CM

## 2023-08-23 DIAGNOSIS — I708 Atherosclerosis of other arteries: Secondary | ICD-10-CM

## 2023-08-23 DIAGNOSIS — J209 Acute bronchitis, unspecified: Secondary | ICD-10-CM

## 2023-09-04 ENCOUNTER — Telehealth: Payer: 59 | Admitting: Physician Assistant

## 2023-09-04 DIAGNOSIS — J019 Acute sinusitis, unspecified: Secondary | ICD-10-CM | POA: Diagnosis not present

## 2023-09-04 DIAGNOSIS — B9689 Other specified bacterial agents as the cause of diseases classified elsewhere: Secondary | ICD-10-CM | POA: Diagnosis not present

## 2023-09-04 MED ORDER — DOXYCYCLINE HYCLATE 100 MG PO TABS
100.0000 mg | ORAL_TABLET | Freq: Two times a day (BID) | ORAL | 0 refills | Status: AC
Start: 2023-09-04 — End: ?

## 2023-09-04 MED ORDER — PROMETHAZINE-DM 6.25-15 MG/5ML PO SYRP
5.0000 mL | ORAL_SOLUTION | Freq: Four times a day (QID) | ORAL | 0 refills | Status: AC | PRN
Start: 2023-09-04 — End: ?

## 2023-09-04 NOTE — Progress Notes (Signed)
I have spent 5 minutes in review of e-visit questionnaire, review and updating patient chart, medical decision making and response to patient.   William Cody Martin, PA-C    

## 2023-09-04 NOTE — Progress Notes (Signed)

## 2023-10-03 ENCOUNTER — Ambulatory Visit: Payer: 59 | Admitting: Family Medicine

## 2023-10-03 ENCOUNTER — Ambulatory Visit: Payer: 59 | Admitting: Nurse Practitioner

## 2023-10-03 NOTE — Progress Notes (Deleted)
There were no vitals taken for this visit.   Subjective:    Patient ID: Annette Arnold, female    DOB: 1961-10-08, 62 y.o.   MRN: [id number]  HPI: Annette Arnold is a 62 y.o. female  No chief complaint on file.  HLD/atherosclerosis:  -Medications: rosuvastatin  10 mg daily -Patient is compliant with above medications and reports no side effects.  -Last lipid panel:  Lipid Panel     Component Value Date/Time   CHOL 138 06/10/2022 1436   CHOL 262 (H) 03/07/2016 0947   TRIG 205 (H) 06/10/2022 1436   HDL 47 (L) 06/10/2022 1436   HDL 46 03/07/2016 0947   CHOLHDL 2.9 06/10/2022 1436   VLDL 65 (H) 02/13/2017 0955   LDLCALC 64 06/10/2022 1436   LABVLDL 42 (H) 03/07/2016 0947   COPD:  -COPD status: controlled -Current medications: trelegy, albuterol  -Satisfied with current treatment?: yes -Oxygen use: no -Dyspnea frequency:  -Cough frequency:  -Rescue inhaler frequency:   -Limitation of activity: {Blank single:19197::"yes","no"} -Productive cough:  -Last Spirometry/PFTs:  -Pneumovax: {Blank single:19197::"Up to Date","Not up to Date","unknown"} -Influenza: {Blank single:19197::"Up to Date","Not up to Date","unknown"}  Relevant past medical, surgical, family and social history reviewed and updated as indicated. Interim medical history since our last visit reviewed. Allergies and medications reviewed and updated.  Review of Systems Constitutional: Negative for fever or weight change.  Respiratory: Negative for cough and shortness of breath.   Cardiovascular: Negative for chest pain or palpitations.  Gastrointestinal: Negative for abdominal pain, no bowel changes.  Musculoskeletal: Negative for gait problem or joint swelling.  Skin: Negative for rash.  Neurological: Negative for dizziness or headache.  No other specific complaints in a complete review of systems (except as listed in HPI above).      Objective:    There were no vitals taken for this visit.  Wt  Readings from Last 3 Encounters:  06/10/22 109 lb 14.4 oz (49.9 kg)  08/28/21 109 lb (49.4 kg)  05/24/21 119 lb 1.6 oz (54 kg)    Physical Exam  Constitutional: Patient appears well-developed and well-nourished. Obese *** No distress.  HEENT: head atraumatic, normocephalic, pupils equal and reactive to light, ears ***, neck supple, throat within normal limits Cardiovascular: Normal rate, regular rhythm and normal heart sounds.  No murmur heard. No BLE edema. Pulmonary/Chest: Effort normal and breath sounds normal. No respiratory distress. Abdominal: Soft.  There is no tenderness. Psychiatric: Patient has a normal mood and affect. behavior is normal. Judgment and thought content normal.  Results for orders placed or performed in visit on 06/10/22  COMPLETE METABOLIC PANEL WITH GFR  Result Value Ref Range   Glucose, Bld 103 (H) 65 - 99 mg/dL   BUN 14 7 - 25 mg/dL   Creat 8.29 5.62 - 1.30 mg/dL   eGFR 87 > OR = 60 QM/VHQ/4.69G2   BUN/Creatinine Ratio NOT APPLICABLE 6 - 22 (calc)   Sodium 139 135 - 146 mmol/L   Potassium 3.7 3.5 - 5.3 mmol/L   Chloride 107 98 - 110 mmol/L   CO2 25 20 - 32 mmol/L   Calcium 8.9 8.6 - 10.4 mg/dL   Total Protein 6.9 6.1 - 8.1 g/dL   Albumin 4.0 3.6 - 5.1 g/dL   Globulin 2.9 1.9 - 3.7 g/dL (calc)   AG Ratio 1.4 1.0 - 2.5 (calc)   Total Bilirubin 0.3 0.2 - 1.2 mg/dL   Alkaline phosphatase (APISO) 70 37 - 153 U/L   AST 12 10 -  35 U/L   ALT 4 (L) 6 - 29 U/L  CBC with Differential/Platelet  Result Value Ref Range   WBC 6.4 3.8 - 10.8 Thousand/uL   RBC 4.14 3.80 - 5.10 Million/uL   Hemoglobin 10.3 (L) 11.7 - 15.5 g/dL   HCT 63.8 (L) 75.6 - 43.3 %   MCV 78.3 (L) 80.0 - 100.0 fL   MCH 24.9 (L) 27.0 - 33.0 pg   MCHC 31.8 (L) 32.0 - 36.0 g/dL   RDW 29.5 (H) 18.8 - 41.6 %   Platelets 254 140 - 400 Thousand/uL   MPV 11.0 7.5 - 12.5 fL   Neutro Abs 3,174 1,500 - 7,800 cells/uL   Lymphs Abs 2,611 850 - 3,900 cells/uL   Absolute Monocytes 429 200 - 950  cells/uL   Eosinophils Absolute 128 15 - 500 cells/uL   Basophils Absolute 58 0 - 200 cells/uL   Neutrophils Relative % 49.6 %   Total Lymphocyte 40.8 %   Monocytes Relative 6.7 %   Eosinophils Relative 2.0 %   Basophils Relative 0.9 %  Lipid panel  Result Value Ref Range   Cholesterol 138 <200 mg/dL   HDL 47 (L) > OR = 50 mg/dL   Triglycerides 606 (H) <150 mg/dL   LDL Cholesterol (Calc) 64 mg/dL (calc)   Total CHOL/HDL Ratio 2.9 <5.0 (calc)   Non-HDL Cholesterol (Calc) 91 <301 mg/dL (calc)      Assessment & Plan:   Problem List Items Addressed This Visit   None    Follow up plan: No follow-ups on file.

## 2023-10-06 DIAGNOSIS — M25562 Pain in left knee: Secondary | ICD-10-CM | POA: Diagnosis not present

## 2023-10-06 DIAGNOSIS — M533 Sacrococcygeal disorders, not elsewhere classified: Secondary | ICD-10-CM | POA: Diagnosis not present

## 2023-10-06 DIAGNOSIS — M791 Myalgia, unspecified site: Secondary | ICD-10-CM | POA: Diagnosis not present

## 2023-10-06 DIAGNOSIS — M47814 Spondylosis without myelopathy or radiculopathy, thoracic region: Secondary | ICD-10-CM | POA: Diagnosis not present

## 2023-10-06 DIAGNOSIS — K5900 Constipation, unspecified: Secondary | ICD-10-CM | POA: Diagnosis not present

## 2023-10-06 DIAGNOSIS — M5481 Occipital neuralgia: Secondary | ICD-10-CM | POA: Diagnosis not present

## 2023-10-06 DIAGNOSIS — M47816 Spondylosis without myelopathy or radiculopathy, lumbar region: Secondary | ICD-10-CM | POA: Diagnosis not present

## 2023-10-06 DIAGNOSIS — Z79899 Other long term (current) drug therapy: Secondary | ICD-10-CM | POA: Diagnosis not present

## 2023-10-06 DIAGNOSIS — G894 Chronic pain syndrome: Secondary | ICD-10-CM | POA: Diagnosis not present

## 2023-10-06 DIAGNOSIS — M47812 Spondylosis without myelopathy or radiculopathy, cervical region: Secondary | ICD-10-CM | POA: Diagnosis not present

## 2023-10-10 ENCOUNTER — Ambulatory Visit: Payer: 59 | Admitting: Nurse Practitioner

## 2023-12-22 ENCOUNTER — Telehealth (INDEPENDENT_AMBULATORY_CARE_PROVIDER_SITE_OTHER): Payer: 59 | Admitting: Family Medicine

## 2023-12-22 ENCOUNTER — Ambulatory Visit: Payer: Self-pay

## 2023-12-22 ENCOUNTER — Encounter: Payer: Self-pay | Admitting: Family Medicine

## 2023-12-22 DIAGNOSIS — R059 Cough, unspecified: Secondary | ICD-10-CM | POA: Diagnosis not present

## 2023-12-22 DIAGNOSIS — F17210 Nicotine dependence, cigarettes, uncomplicated: Secondary | ICD-10-CM | POA: Diagnosis not present

## 2023-12-22 DIAGNOSIS — J441 Chronic obstructive pulmonary disease with (acute) exacerbation: Secondary | ICD-10-CM | POA: Diagnosis not present

## 2023-12-22 DIAGNOSIS — Z720 Tobacco use: Secondary | ICD-10-CM

## 2023-12-22 MED ORDER — BENZONATATE 100 MG PO CAPS
100.0000 mg | ORAL_CAPSULE | Freq: Two times a day (BID) | ORAL | 0 refills | Status: DC | PRN
Start: 2023-12-22 — End: 2024-10-03

## 2023-12-22 MED ORDER — GUAIFENESIN-DM 100-10 MG/5ML PO SYRP
5.0000 mL | ORAL_SOLUTION | ORAL | 0 refills | Status: AC | PRN
Start: 2023-12-22 — End: ?

## 2023-12-22 MED ORDER — AZITHROMYCIN 200 MG/5ML PO SUSR
ORAL | 0 refills | Status: AC
Start: 2023-12-22 — End: 2023-12-27

## 2023-12-22 NOTE — Progress Notes (Signed)
MyChart Video Visit    Virtual Visit via Video Note   This format is felt to be most appropriate for this patient at this time. Physical exam was limited by quality of the video and audio technology used for the visit.   Patient location: Home Provider location: [facility]  I discussed the limitations of evaluation and management by telemedicine and the availability of in person appointments. The patient expressed understanding and agreed to proceed.  Patient: Annette Arnold   DOB: October 31, 1961   62 y.o. Female  MRN: [id number] Visit Date: 12/22/2023  Today's healthcare provider: Sherlyn Hay, DO   No chief complaint on file.  Subjective    HPI  Patient is a established patient of [facility] who is being seen for an acute cough.   The patient, with a known history of COPD, presents with a recurrent cough. The cough, initially diagnosed as an upper respiratory infection in September, was treated with doxycycline and initially improved. However, it recently worsened, becoming productive with some green sputum.  However, she notes that she is having a lot more difficulty coughing up sputum, though she feels very congested.  The patient denies any associated fever, chills, or shortness of breath.  The patient lives with her son, a Nutritional therapist, who recently had a head cold with significant nasal drainage but tested negative for COVID-19. The patient believes her current symptoms may be due to exposure to her son's illness.  The patient is a current smoker and was diagnosed with COPD within the past year. She has tried to quit smoking but has not been successful.  The patient's cough is not constant throughout the day but is most noticeable upon waking and when going to bed at night. The patient is able to expectorate the sputum.  The patient also reports a phobia of swallowing large pills, which has led to choking incidents in the past. She prefers liquid  medications or small pills. She is currently taking a small cholesterol pill without difficulty.   Medications: Outpatient Medications Prior to Visit  Medication Sig   albuterol (VENTOLIN HFA) 108 (90 Base) MCG/ACT inhaler Inhale 2 puffs into the lungs every 6 (six) hours as needed for wheezing or shortness of breath.   busPIRone (BUSPAR) 10 MG tablet Take 10 mg by mouth 3 (three) times daily as needed.   cyclobenzaprine (FLEXERIL) 10 MG tablet Take 10 mg by mouth 3 (three) times daily as needed.   doxycycline (VIBRA-TABS) 100 MG tablet Take 1 tablet (100 mg total) by mouth 2 (two) times daily.   Fluticasone-Umeclidin-Vilant (TRELEGY ELLIPTA) 100-62.5-25 MCG/ACT AEPB Inhale 1 puff into the lungs daily.   naloxone (NARCAN) 0.4 MG/ML injection Prn accidental overdose , may repeat 2-3 minutes and call 911   Oxycodone HCl 10 MG TABS Take 1 tablet (10 mg total) by mouth 3 (three) times daily as needed.   polyethylene glycol (MIRALAX / GLYCOLAX) packet Take 17 g by mouth daily.   promethazine-dextromethorphan (PROMETHAZINE-DM) 6.25-15 MG/5ML syrup Take 5 mLs by mouth 4 (four) times daily as needed for cough.   rosuvastatin (CRESTOR) 10 MG tablet TAKE 1 TABLET(10 MG) BY MOUTH DAILY   triamcinolone cream (KENALOG) 0.1 % Apply 1 application topically 2 (two) times daily.   No facility-administered medications prior to visit.    Review of Systems  Constitutional:  Negative for appetite change, chills, fatigue and fever.  HENT:  Positive for congestion.   Respiratory:  Positive for cough. Negative  for chest tightness and shortness of breath.   Cardiovascular:  Negative for chest pain and palpitations.  Gastrointestinal:  Negative for abdominal pain, nausea and vomiting.        Objective    There were no vitals taken for this visit.     Physical Exam Constitutional:      General: She is not in acute distress.    Appearance: Normal appearance.  HENT:     Head: Normocephalic.   Pulmonary:     Effort: Pulmonary effort is normal. No respiratory distress.  Neurological:     Mental Status: She is alert and oriented to person, place, and time. Mental status is at baseline.       Assessment & Plan    Chronic obstructive pulmonary disease with acute exacerbation (HCC) -     Azithromycin; Take 12.5 mLs (500 mg total) by mouth daily for 1 day, THEN 6.3 mLs (250 mg total) daily for 4 days.  Dispense: 22.5 mL; Refill: 0 -     guaiFENesin-DM; Take 5 mLs by mouth every 4 (four) hours as needed for cough.  Dispense: 118 mL; Refill: 0 -     Benzonatate; Take 1 capsule (100 mg total) by mouth 2 (two) times daily as needed for cough.  Dispense: 20 capsule; Refill: 0  Declined smoking cessation  COPD exacerbation   Presents with a productive cough with green sputum, worsened recently. Diagnosed with COPD last year, current smoker. No fever, chills, shortness of breath, or chest pain. Likely exacerbation due to recent upper respiratory infection from close contact with her son. Difficulty swallowing large pills. Discussed azithromycin (Z-Pak) in liquid form due to pill size phobia. Informed about potential insurance coverage issues for liquid antibiotics and advised to contact via MyChart if there are issues.   - Prescribe azithromycin (Z-Pak) in liquid form, 500 mg on day 1, then 250 mg daily for 4 days   - Send prescription to [facility] in [place]   - Advise to contact via MyChart if there are issues with the prescription or insurance coverage   - Recommend using food to help swallow pills if necessary    Cough   Reports a non-steady cough, worse at night and upon waking. Not taking any over-the-counter medications for the cough. Discussed Tessalon Perles (benzonatate) if the cough worsens. Informed about over-the-counter cough syrup to help loosen mucus and reduce cough.   - Recommend Robitussin DM cough syrup, once in the morning and once at night - Discuss the option of  Tessalon Perles (benzonatate) for the cough  - Send prescription for cough syrup and benzonatate to the same pharmacy as the antibiotic    General Health Maintenance  Declined smoking cessation Current smoker advised to quit smoking due to the progressive nature of COPD.   - Encourage smoking cessation; however, patient was not interested at this time.  Return if symptoms worsen or fail to improve.     I discussed the assessment and treatment plan with the patient. The patient was provided an opportunity to ask questions and all were answered. The patient agreed with the plan and demonstrated an understanding of the instructions.   The patient was advised to call back or seek an in-person evaluation if the symptoms worsen or if the condition fails to improve as anticipated.  I provided 12 minutes of virtual-face-to-face time during this encounter.   Sherlyn Hay, DO [facility] [phone] (phone) [phone] (fax)  [facility]

## 2023-12-22 NOTE — Telephone Encounter (Signed)
  Chief Complaint: cough Symptoms: congested cough with green mucus, chest tightness  Frequency: 3-4 days  Pertinent Negatives: Patient denies SOB or fever  Disposition: [] ED /[] Urgent Care (no appt availability in office) / [x] Appointment(In office/virtual)/ []  [facility]/ [] Home Care/ [] Refused Recommended Disposition /[] [facility]/ []  Follow-up with PCP Additional Notes: pt states she is needing an abx for her sx. She has tried Dayquil in the past but last time this happened needed an abx. No appts with [facility]. Was able to schedule with Dr. Payton Mccallum [facility] at 1320 today. Care advice given and pt verbalized understanding.  Pt was able to verify she has access to her Mychart account.   Summary: cough, seeking virtual   Seeking virtual appt today, has cough that she believes she will need an antibiotic for. Says it is stuck in her chest. No appt until Thursday  Best contact: [phone]     Reason for Disposition  [1] Continuous (nonstop) coughing interferes with work or school AND [2] no improvement using cough treatment per Care Advice  Answer Assessment - Initial Assessment Questions 1. ONSET: "When did the cough begin?"      3-4 days  3. SPUTUM: "Describe the color of your sputum" (none, dry cough; clear, white, yellow, green)     Green mucus  5. DIFFICULTY BREATHING: "Are you having difficulty breathing?" If Yes, ask: "How bad is it?" (e.g., mild, moderate, severe)    - MILD: No SOB at rest, mild SOB with walking, speaks normally in sentences, can lie down, no retractions, pulse < 100.    - MODERATE: SOB at rest, SOB with minimal exertion and prefers to sit, cannot lie down flat, speaks in phrases, mild retractions, audible wheezing, pulse 100-120.    - SEVERE: Very SOB at rest, speaks in single words, struggling to breathe, sitting hunched forward, retractions, pulse > 120      no 6. FEVER: "Do you have a fever?" If Yes, ask: "What is your temperature, how  was it measured, and when did it start?"     no 8. LUNG HISTORY: "Do you have any history of lung disease?"  (e.g., pulmonary embolus, asthma, emphysema)     COPD 10. OTHER SYMPTOMS: "Do you have any other symptoms?" (e.g., runny nose, wheezing, chest pain)       Chest tightness, dizziness  Protocols used: Cough - Acute Productive-A-AH

## 2023-12-26 ENCOUNTER — Encounter: Payer: Self-pay | Admitting: Family Medicine

## 2024-01-07 DIAGNOSIS — M47816 Spondylosis without myelopathy or radiculopathy, lumbar region: Secondary | ICD-10-CM | POA: Diagnosis not present

## 2024-01-07 DIAGNOSIS — K5900 Constipation, unspecified: Secondary | ICD-10-CM | POA: Diagnosis not present

## 2024-01-07 DIAGNOSIS — M25562 Pain in left knee: Secondary | ICD-10-CM | POA: Diagnosis not present

## 2024-01-07 DIAGNOSIS — M47812 Spondylosis without myelopathy or radiculopathy, cervical region: Secondary | ICD-10-CM | POA: Diagnosis not present

## 2024-01-07 DIAGNOSIS — M791 Myalgia, unspecified site: Secondary | ICD-10-CM | POA: Diagnosis not present

## 2024-01-07 DIAGNOSIS — M5481 Occipital neuralgia: Secondary | ICD-10-CM | POA: Diagnosis not present

## 2024-01-07 DIAGNOSIS — M533 Sacrococcygeal disorders, not elsewhere classified: Secondary | ICD-10-CM | POA: Diagnosis not present

## 2024-01-07 DIAGNOSIS — M47814 Spondylosis without myelopathy or radiculopathy, thoracic region: Secondary | ICD-10-CM | POA: Diagnosis not present

## 2024-01-07 DIAGNOSIS — G894 Chronic pain syndrome: Secondary | ICD-10-CM | POA: Diagnosis not present

## 2024-01-07 DIAGNOSIS — Z79899 Other long term (current) drug therapy: Secondary | ICD-10-CM | POA: Diagnosis not present

## 2024-05-11 DIAGNOSIS — M791 Myalgia, unspecified site: Secondary | ICD-10-CM | POA: Diagnosis not present

## 2024-05-11 DIAGNOSIS — M5481 Occipital neuralgia: Secondary | ICD-10-CM | POA: Diagnosis not present

## 2024-05-11 DIAGNOSIS — G894 Chronic pain syndrome: Secondary | ICD-10-CM | POA: Diagnosis not present

## 2024-05-11 DIAGNOSIS — M47812 Spondylosis without myelopathy or radiculopathy, cervical region: Secondary | ICD-10-CM | POA: Diagnosis not present

## 2024-05-11 DIAGNOSIS — M47816 Spondylosis without myelopathy or radiculopathy, lumbar region: Secondary | ICD-10-CM | POA: Diagnosis not present

## 2024-05-11 DIAGNOSIS — Z79899 Other long term (current) drug therapy: Secondary | ICD-10-CM | POA: Diagnosis not present

## 2024-05-11 DIAGNOSIS — M5416 Radiculopathy, lumbar region: Secondary | ICD-10-CM | POA: Diagnosis not present

## 2024-05-11 DIAGNOSIS — M533 Sacrococcygeal disorders, not elsewhere classified: Secondary | ICD-10-CM | POA: Diagnosis not present

## 2024-05-11 DIAGNOSIS — M47814 Spondylosis without myelopathy or radiculopathy, thoracic region: Secondary | ICD-10-CM | POA: Diagnosis not present

## 2024-05-11 DIAGNOSIS — M25559 Pain in unspecified hip: Secondary | ICD-10-CM | POA: Diagnosis not present

## 2024-05-11 DIAGNOSIS — M5412 Radiculopathy, cervical region: Secondary | ICD-10-CM | POA: Diagnosis not present

## 2024-09-02 DIAGNOSIS — M533 Sacrococcygeal disorders, not elsewhere classified: Secondary | ICD-10-CM | POA: Diagnosis not present

## 2024-09-02 DIAGNOSIS — M47812 Spondylosis without myelopathy or radiculopathy, cervical region: Secondary | ICD-10-CM | POA: Diagnosis not present

## 2024-09-02 DIAGNOSIS — Z79899 Other long term (current) drug therapy: Secondary | ICD-10-CM | POA: Diagnosis not present

## 2024-09-02 DIAGNOSIS — G894 Chronic pain syndrome: Secondary | ICD-10-CM | POA: Diagnosis not present

## 2024-09-02 DIAGNOSIS — M25551 Pain in right hip: Secondary | ICD-10-CM | POA: Diagnosis not present

## 2024-09-02 DIAGNOSIS — M461 Sacroiliitis, not elsewhere classified: Secondary | ICD-10-CM | POA: Diagnosis not present

## 2024-09-02 DIAGNOSIS — M47816 Spondylosis without myelopathy or radiculopathy, lumbar region: Secondary | ICD-10-CM | POA: Diagnosis not present

## 2024-09-02 DIAGNOSIS — M47814 Spondylosis without myelopathy or radiculopathy, thoracic region: Secondary | ICD-10-CM | POA: Diagnosis not present

## 2024-09-02 DIAGNOSIS — M791 Myalgia, unspecified site: Secondary | ICD-10-CM | POA: Diagnosis not present

## 2024-09-02 DIAGNOSIS — M25562 Pain in left knee: Secondary | ICD-10-CM | POA: Diagnosis not present

## 2024-09-02 DIAGNOSIS — M25552 Pain in left hip: Secondary | ICD-10-CM | POA: Diagnosis not present

## 2024-09-02 DIAGNOSIS — M5481 Occipital neuralgia: Secondary | ICD-10-CM | POA: Diagnosis not present

## 2024-09-23 NOTE — Progress Notes (Signed)
 Annette Arnold                                          MRN: [id number]   09/23/2024   The [facility] Quality Team Specialist reviewed this patient medical record for the purposes of chart review for care gap closure. The following were reviewed: chart review for care gap closure-colorectal cancer screening.    [facility] Quality Team

## 2024-10-03 ENCOUNTER — Telehealth: Admitting: Family

## 2024-10-03 DIAGNOSIS — J209 Acute bronchitis, unspecified: Secondary | ICD-10-CM

## 2024-10-03 MED ORDER — PREDNISONE 10 MG (21) PO TBPK
ORAL_TABLET | ORAL | 0 refills | Status: AC
Start: 2024-10-03 — End: ?

## 2024-10-03 MED ORDER — BENZONATATE 100 MG PO CAPS
100.0000 mg | ORAL_CAPSULE | Freq: Three times a day (TID) | ORAL | 0 refills | Status: AC | PRN
Start: 2024-10-03 — End: ?

## 2024-10-03 NOTE — Progress Notes (Signed)
 We are sorry that you are not feeling well.  Here is how we plan to help!  Based on your presentation I believe you most likely have A cough due to a virus.  This is called viral bronchitis and is best treated by rest, plenty of fluids and control of the cough.  You may use Ibuprofen or Tylenol as directed to help your symptoms.     In addition you may use A non-prescription cough medication called Robitussin DAC. Take 2 teaspoons every 8 hours or Delsym: take 2 teaspoons every 12 hours., A non-prescription cough medication called Mucinex  DM: take 2 tablets every 12 hours., and A prescription cough medication called Tessalon  Perles 100mg . You may take 1-2 capsules every 8 hours as needed for your cough.  Prednisone  10 mg daily for 6 days (see taper instructions below)  Directions for 6 day taper: Day 1: 2 tablets before breakfast, 1 after both lunch & dinner and 2 at bedtime Day 2: 1 tab before breakfast, 1 after both lunch & dinner and 2 at bedtime Day 3: 1 tab at each meal & 1 at bedtime Day 4: 1 tab at breakfast, 1 at lunch, 1 at bedtime Day 5: 1 tab at breakfast & 1 tab at bedtime Day 6: 1 tab at breakfast  From your responses in the eVisit questionnaire you describe inflammation in the upper respiratory tract which is causing a significant cough.  This is commonly called Bronchitis and has four common causes:   Allergies Viral Infections Acid Reflux Bacterial Infection Allergies, viruses and acid reflux are treated by controlling symptoms or eliminating the cause. An example might be a cough caused by taking certain blood pressure medications. You stop the cough by changing the medication. Another example might be a cough caused by acid reflux. Controlling the reflux helps control the cough.  USE OF BRONCHODILATOR (RESCUE) INHALERS: There is a risk from using your bronchodilator too frequently.  The risk is that over-reliance on a medication which only relaxes the muscles surrounding  the breathing tubes can reduce the effectiveness of medications prescribed to reduce swelling and congestion of the tubes themselves.  Although you feel brief relief from the bronchodilator inhaler, your asthma may actually be worsening with the tubes becoming more swollen and filled with mucus.  This can delay other crucial treatments, such as oral steroid medications. If you need to use a bronchodilator inhaler daily, several times per day, you should discuss this with your provider.  There are probably better treatments that could be used to keep your asthma under control.     HOME CARE Only take medications as instructed by your medical team. Complete the entire course of an antibiotic. Drink plenty of fluids and get plenty of rest. Avoid close contacts especially the very young and the elderly Cover your mouth if you cough or cough into your sleeve. Always remember to wash your hands A steam or ultrasonic humidifier can help congestion.   GET HELP RIGHT AWAY IF: You develop worsening fever. You become short of breath You cough up blood. Your symptoms persist after you have completed your treatment plan MAKE SURE YOU  Understand these instructions. Will watch your condition. Will get help right away if you are not doing well or get worse.  Your e-visit answers were reviewed by a board certified advanced clinical practitioner to complete your personal care plan.  Depending on the condition, your plan could have included both over the counter or prescription medications. If there  is a problem please reply  once you have received a response from your provider. Your safety is important to us .  If you have drug allergies check your prescription carefully.    You can use MyChart to ask questions about today's visit, request a non-urgent call back, or ask for a work or school excuse for 24 hours related to this e-Visit. If it has been greater than 24 hours you will need to follow up with your  provider, or enter a new e-Visit to address those concerns. You will get an e-mail in the next two days asking about your experience.  I hope that your e-visit has been valuable and will speed your recovery. Thank you for using e-visits.   I have spent 5 minutes in review of e-visit questionnaire, review and updating patient chart, medical decision making and response to patient.   Bari Learn, FNP

## 2024-10-11 ENCOUNTER — Telehealth: Admitting: Physician Assistant

## 2024-10-11 DIAGNOSIS — J40 Bronchitis, not specified as acute or chronic: Secondary | ICD-10-CM

## 2024-10-11 MED ORDER — AZITHROMYCIN 200 MG/5ML PO SUSR: ORAL | 0 refills | Status: DC

## 2024-10-11 MED ORDER — AZITHROMYCIN 250 MG PO TABS: ORAL_TABLET | ORAL | 0 refills | Status: DC

## 2024-10-11 NOTE — Progress Notes (Signed)
 We are sorry that you are not feeling well.  Here is how we plan to help!  Based on your presentation I believe you most likely have A cough due to bacteria.  When patients have a fever and a productive cough with a change in color or increased sputum production, we are concerned about bacterial bronchitis.  If left untreated it can progress to pneumonia.  If your symptoms do not improve with your treatment plan it is important that you contact your provider.   I have prescribed Azithromyin 250 mg: two tablets now and then one tablet daily for 4 additonal days    From your responses in the eVisit questionnaire you describe inflammation in the upper respiratory tract which is causing a significant cough.  This is commonly called Bronchitis and has four common causes:   Allergies Viral Infections Acid Reflux Bacterial Infection Allergies, viruses and acid reflux are treated by controlling symptoms or eliminating the cause. An example might be a cough caused by taking certain blood pressure medications. You stop the cough by changing the medication. Another example might be a cough caused by acid reflux. Controlling the reflux helps control the cough.  USE OF BRONCHODILATOR (RESCUE) INHALERS: There is a risk from using your bronchodilator too frequently.  The risk is that over-reliance on a medication which only relaxes the muscles surrounding the breathing tubes can reduce the effectiveness of medications prescribed to reduce swelling and congestion of the tubes themselves.  Although you feel brief relief from the bronchodilator inhaler, your asthma may actually be worsening with the tubes becoming more swollen and filled with mucus.  This can delay other crucial treatments, such as oral steroid medications. If you need to use a bronchodilator inhaler daily, several times per day, you should discuss this with your provider.  There are probably better treatments that could be used to keep your asthma  under control.     HOME CARE Only take medications as instructed by your medical team. Complete the entire course of an antibiotic. Drink plenty of fluids and get plenty of rest. Avoid close contacts especially the very young and the elderly Cover your mouth if you cough or cough into your sleeve. Always remember to wash your hands A steam or ultrasonic humidifier can help congestion.   GET HELP RIGHT AWAY IF: You develop worsening fever. You become short of breath You cough up blood. Your symptoms persist after you have completed your treatment plan MAKE SURE YOU  Understand these instructions. Will watch your condition. Will get help right away if you are not doing well or get worse.  Your e-visit answers were reviewed by a board certified advanced clinical practitioner to complete your personal care plan.  Depending on the condition, your plan could have included both over the counter or prescription medications. If there is a problem please reply  once you have received a response from your provider. Your safety is important to us .  If you have drug allergies check your prescription carefully.    You can use MyChart to ask questions about today's visit, request a non-urgent call back, or ask for a work or school excuse for 24 hours related to this e-Visit. If it has been greater than 24 hours you will need to follow up with your provider, or enter a new e-Visit to address those concerns. You will get an e-mail in the next two days asking about your experience.  I hope that your e-visit has been valuable and  will speed your recovery. Thank you for using e-visits.   I have spent 5 minutes in review of e-visit questionnaire, review and updating patient chart, medical decision making and response to patient.   Chiquita CHRISTELLA Barefoot, NP

## 2024-10-11 NOTE — Addendum Note (Signed)
 Addended by: GLADIS ELSIE BROCKS on: 10/11/2024 04:16 PM   Modules accepted: Orders

## 2024-10-12 MED ORDER — AZITHROMYCIN 200 MG/5ML PO SUSR: ORAL | 0 refills | Status: AC

## 2024-10-12 NOTE — Addendum Note (Signed)
 Addended by: GLADIS ELSIE BROCKS on: 10/12/2024 01:12 PM   Modules accepted: Orders
